# Patient Record
Sex: Male | Born: 1950 | Race: White | Hispanic: No | Marital: Married | Smoking: Former smoker
Health system: Southern US, Community
[De-identification: ages and names within clinical notes are randomized; demographics above are authoritative.]

## PROBLEM LIST (undated history)

## (undated) DIAGNOSIS — K921 Melena: Secondary | ICD-10-CM

## (undated) DIAGNOSIS — D509 Iron deficiency anemia, unspecified: Secondary | ICD-10-CM

## (undated) DIAGNOSIS — I10 Essential (primary) hypertension: Secondary | ICD-10-CM

## (undated) DIAGNOSIS — M1990 Unspecified osteoarthritis, unspecified site: Secondary | ICD-10-CM

## (undated) DIAGNOSIS — K219 Gastro-esophageal reflux disease without esophagitis: Secondary | ICD-10-CM

## (undated) DIAGNOSIS — G4730 Sleep apnea, unspecified: Secondary | ICD-10-CM

## (undated) DIAGNOSIS — K859 Acute pancreatitis without necrosis or infection, unspecified: Secondary | ICD-10-CM

## (undated) DIAGNOSIS — I739 Peripheral vascular disease, unspecified: Secondary | ICD-10-CM

## (undated) DIAGNOSIS — R011 Cardiac murmur, unspecified: Secondary | ICD-10-CM

## (undated) DIAGNOSIS — I2510 Atherosclerotic heart disease of native coronary artery without angina pectoris: Secondary | ICD-10-CM

## (undated) DIAGNOSIS — E119 Type 2 diabetes mellitus without complications: Secondary | ICD-10-CM

## (undated) DIAGNOSIS — Z9289 Personal history of other medical treatment: Secondary | ICD-10-CM

## (undated) DIAGNOSIS — E785 Hyperlipidemia, unspecified: Secondary | ICD-10-CM

## (undated) HISTORY — DX: Unspecified osteoarthritis, unspecified site: M19.90

## (undated) HISTORY — PX: JOINT REPLACEMENT: SHX530

## (undated) HISTORY — DX: Acute pancreatitis without necrosis or infection, unspecified: K85.90

## (undated) HISTORY — DX: Atherosclerotic heart disease of native coronary artery without angina pectoris: I25.10

## (undated) HISTORY — DX: Hyperlipidemia, unspecified: E78.5

## (undated) HISTORY — DX: Iron deficiency anemia, unspecified: D50.9

## (undated) HISTORY — PX: TOTAL KNEE ARTHROPLASTY: SHX125

## (undated) HISTORY — DX: Melena: K92.1

---

## 1996-12-25 HISTORY — PX: ERCP: SHX60

## 1999-08-20 HISTORY — PX: CARDIAC CATHETERIZATION: SHX172

## 1999-09-09 ENCOUNTER — Ambulatory Visit (HOSPITAL_COMMUNITY): Admission: RE | Admit: 1999-09-09 | Discharge: 1999-09-09 | Payer: Self-pay | Admitting: Cardiovascular Disease

## 1999-09-09 ENCOUNTER — Encounter: Payer: Self-pay | Admitting: Cardiovascular Disease

## 2006-02-14 ENCOUNTER — Ambulatory Visit (HOSPITAL_COMMUNITY): Admission: RE | Admit: 2006-02-14 | Discharge: 2006-02-14 | Payer: Self-pay | Admitting: General Surgery

## 2006-02-14 HISTORY — PX: COLONOSCOPY: SHX174

## 2006-05-04 ENCOUNTER — Inpatient Hospital Stay (HOSPITAL_COMMUNITY): Admission: RE | Admit: 2006-05-04 | Discharge: 2006-05-09 | Payer: Self-pay | Admitting: Orthopedic Surgery

## 2007-04-19 ENCOUNTER — Inpatient Hospital Stay (HOSPITAL_COMMUNITY): Admission: RE | Admit: 2007-04-19 | Discharge: 2007-04-22 | Payer: Self-pay | Admitting: Orthopedic Surgery

## 2007-06-26 ENCOUNTER — Ambulatory Visit (HOSPITAL_COMMUNITY): Admission: RE | Admit: 2007-06-26 | Discharge: 2007-06-26 | Payer: Self-pay | Admitting: Orthopedic Surgery

## 2008-11-08 HISTORY — PX: UPPER GASTROINTESTINAL ENDOSCOPY: SHX188

## 2008-11-25 HISTORY — PX: OTHER SURGICAL HISTORY: SHX169

## 2009-11-19 DIAGNOSIS — D509 Iron deficiency anemia, unspecified: Secondary | ICD-10-CM

## 2009-11-19 DIAGNOSIS — K921 Melena: Secondary | ICD-10-CM

## 2009-11-19 DIAGNOSIS — IMO0002 Reserved for concepts with insufficient information to code with codable children: Secondary | ICD-10-CM

## 2009-11-19 DIAGNOSIS — K859 Acute pancreatitis without necrosis or infection, unspecified: Secondary | ICD-10-CM

## 2009-11-19 HISTORY — DX: Iron deficiency anemia, unspecified: D50.9

## 2009-11-19 HISTORY — DX: Acute pancreatitis without necrosis or infection, unspecified: K85.90

## 2009-11-19 HISTORY — DX: Reserved for concepts with insufficient information to code with codable children: IMO0002

## 2009-11-19 HISTORY — DX: Melena: K92.1

## 2010-08-24 LAB — CBC AND DIFFERENTIAL
HCT: 38 % — AB (ref 41–53)
Platelets: 316 10*3/uL (ref 150–399)

## 2010-10-02 DIAGNOSIS — I2510 Atherosclerotic heart disease of native coronary artery without angina pectoris: Secondary | ICD-10-CM

## 2010-10-02 HISTORY — DX: Atherosclerotic heart disease of native coronary artery without angina pectoris: I25.10

## 2010-11-03 NOTE — Op Note (Signed)
NAMEALFONZO, ARCA NO.:  [id number]   MEDICAL RECORD NO.:  [id number]          PATIENT TYPE:  INP   LOCATION:  X010                         FACILITY:  [facility]   PHYSICIAN:  Ollen Gross, M.D.    DATE OF BIRTH:  03/06/1951   DATE OF PROCEDURE:  04/19/2007  DATE OF DISCHARGE:                               OPERATIVE REPORT   PREOPERATIVE DIAGNOSIS:  Osteoarthritis, right knee.   POSTOPERATIVE DIAGNOSIS:  Osteoarthritis, right knee.   PROCEDURE:  Right total knee arthroplasty.   SURGEON:  Ollen Gross, M.D.   ASSISTANT:  Alexzandrew L. Perkins, P.A.-C.   ANESTHESIA:  Spinal.   BLOOD LOSS:  Minimal.   DRAINS:  None.   TOURNIQUET TIME:  35 minutes at 300 mmHg.   COMPLICATIONS:  None.   CONDITION:  Stable to recovery.   BRIEF CLINICAL NOTE:  Luke [name] is a 60 year old male with severe end-stage  arthritis of the right knee with progressively worsening pain and  dysfunction.  He has had a very successful left total knee arthroplasty  and presents now for right total knee arthroplasty.   PROCEDURE IN DETAIL:  The the successful initiation of spinal  anesthetic, a tourniquet is placed on the right thigh and right lower  extremity prepped and draped in the usual sterile fashion.  The  extremity is wrapped in an Esmarch, the knee flexed, tourniquet inflated  to 300 mmHg.  A midline incision made with a 10 blade through the  subcutaneous tissue to the level of the extensor mechanism.  A fresh  blade is used to make a medial parapatellar arthrotomy.  Soft tissue  over the proximal medial tibia is subperiosteally elevated to the joint  line with a knife and into the semimembranosus bursa with a Cobb  elevator.  Soft tissue laterally is elevated with attention being paid  to avoiding the patellar tendon on the tibial tubercle.  The patella is  subluxed laterally, the knee flexed 90 degrees, and ACL and PCL are  removed.  A drill is used to create a starting hole  in the distal femur  and the canal is thoroughly irrigated.  The 5-degree right valgus  alignment guide is placed and referencing off the posterior condyles,  rotation is marked and the block pinned to remove 10 mm off the distal  femur.  Distal femoral resection is made with an oscillating saw.  A  sizing block is placed, a size 3 is the most appropriate.  Rotation is  marked off the epicondylar axis.  A size 3 cutting block is placed and  the anterior-posterior and chamfer cuts made.   Tibia is subluxed forward and the menisci are removed.  The  extramedullary tibial alignment guide is placed referencing proximally  at the medial aspect of the tibial tubercle and distally along the  second metatarsal axis and tibial crest.  The block is pinned to remove  about 10 mm off the nondeficient lateral side.  We have to go down an  additional 2 mm to get to the base of the defect medially.  The tibial  resection is made with an oscillating saw.  A size 3 is also the most  appropriate tibial component and the proximal tibia is prepared with the  modular drill and keel punch for the size 3.  Femoral preparation is  completed with the intercondylar cut.   A size 3 mobile bearing tibial trial, size 3posterior stabilized femoral  trial and a 12.5-mm posterior stabilized rotating platform insert trial  are placed.  With the 12.5, full extension is achieved with excellent  varus and valgus balance throughout a full range of motion.  The patella  is then everted and the thickness measured to be 22 mL.  Freehand  resection is taken to about 12 mm.  The 38 template is placed, lug holes  are drilled, trial patella is placed and it tracks normally.  Osteophytes are removed off the posterior femur with the trial in place.  All trials are removed and the cut bone surfaces are prepared with  pulsatile lavage.  Cement is mixed and once ready for implantation, a  size 3 mobile bearing tibial tray, size 3  posterior stabilized femur and  38 patella are cemented into place.  The patella is held the clamp.  A  trial 12.5 insert is placed and the knee held in full extension, all  extruded cement removed.  When the cement is fully hardened, then the  permanent 12.5 mm posterior stabilized rotating platform insert is  placed into the tibial tray.  The wound is copiously irrigated with  saline solution.  The FloSeal is injected on the posterior capsule,  medial and lateral gutters and suprapatellar area.  The tourniquet is  then released with a total time of 35 minutes.  Minor bleeding is  stopped with cautery.  The joint is again irrigated and then the  extensor mechanism closed with interrupted #1 PDS.  Flexion against  gravity is 135 degrees.  The subcu is closed with interrupted 2-0  Vicryl, subcuticular running 4-0 Monocryl.  The incision is cleaned and  dried and Steri-Strips and a bulky sterile dressing applied.  He is  placed into a knee immobilizer, awakened and transported to recovery in  stable condition.      Ollen Gross, M.D.  Electronically Signed     FA/MEDQ  D:  04/19/2007  T:  04/20/2007  Job:  [id number]

## 2010-11-03 NOTE — Op Note (Signed)
[name], [name] NO.:  [id number]   MEDICAL RECORD NO.:  [id number]          PATIENT TYPE:  AMB   LOCATION:  DAY                          FACILITY:  [facility]   PHYSICIAN:  Ollen Gross, M.D.    DATE OF BIRTH:  [birth year]   DATE OF PROCEDURE:  06/26/2007  DATE OF DISCHARGE:                               OPERATIVE REPORT   PREOPERATIVE DIAGNOSIS:  Arthrofibrosis, right knee.   POSTOPERATIVE DIAGNOSIS:  Arthrofibrosis, right knee.   PROCEDURE:  Right knee closed manipulation.   SURGEON:  Ollen Gross, M.D.  No assistant.   ANESTHESIA:  General.   Pre-manipulation range of motion 15-90, post-manipulation range of  motion 0-125.   COMPLICATIONS:  None.   CONDITION:  Stable to recovery.   BRIEF CLINICAL NOTE:  Luke [name] is a 60 year old male who had a right total  knee arthroplasty done on April 19, 2007.  He has had difficulty  regaining his range of motion despite effort with physical therapy.  He  is stuck at 15-90 degrees and presents for closed manipulation.   PROCEDURE IN DETAIL:  After the successful initiation of general  anesthetic, I did an exam under anesthesia.  Range was 15-90.  I placed  my chest on his proximal tibia and flexed the knee with audible lysis of  adhesions.  I was able to get him to flex to 125 degrees.  I was then  able to compress the hip in extension to gain full extension.  His  patellar mobility improved dramatically.  He is subsequently awakened  and transported to recovery in stable condition.      Ollen Gross, M.D.  Electronically Signed     FA/MEDQ  D:  06/26/2007  T:  06/27/2007  Job:  [id number]

## 2010-11-03 NOTE — H&P (Signed)
NAMESTILLMAN, BUENGER NO.:  [id number]   MEDICAL RECORD NO.:  [id number]          PATIENT TYPE:  INP   LOCATION:  X010                         FACILITY:  [facility]   PHYSICIAN:  Ollen Gross, M.D.    DATE OF BIRTH:  1950/12/04   DATE OF ADMISSION:  04/19/2007  DATE OF DISCHARGE:                              HISTORY & PHYSICAL   CHIEF COMPLAINT:  Left knee pain.   HISTORY OF PRESENT ILLNESS:  The patient is a 60 year old male who has  seen by Dr. Lequita Halt for ongoing bilateral knee pain.  The left knee is  more symptomatic and problematic than left knee.  He has been followed  for quite some time now. He has known arthritis.  He has previously  undergone a left total knee and is doing quite well. The right knee  continues to have pain, and it is felt he has reached the point where he  would benefit from undergoing surgical intervention.  Risks and benefits  have been discussed.  He elects to proceed with surgery.   ALLERGIES:  MORPHINE causes itching.   INTOLERANCES:  PERCOCET causes nausea, vomiting.   CURRENT MEDICATIONS:  Ramipril, Janumet, diclofenac,  bisoprolol/hydrochlorothiazide, Caduet, and aspirin and also Niaspan.   PAST MEDICAL HISTORY:  1. Diabetes.  2. Hypertension.  3. Hyperlipidemia.  4. Mild nonobstructive coronary arterial disease.  5. He has a past history of sleep apnea, but this resolved with weight      loss.   PAST SURGICAL HISTORY:  Left total knee replacement November 2007.   SOCIAL HISTORY:  Married.  No history of alcohol or tobacco. Two  children.  Wife will be assisting with care after surgery.   FAMILY HISTORY:  Negative.   REVIEW OF SYSTEMS:  GENERAL:  No fevers, chills or night sweats.  NEUROLOGIC:  No seizures, syncope, or paralysis.  RESPIRATORY:  No  shortness of breath, productive cough or hemoptysis.  CARDIOVASCULAR:  No chest pain, angina, orthopnea.  GI:  No nausea, vomiting,diarrhea,  or constipation.  GU:  No  dysuria, hematuria,  or discharge.  MUSCULOSKELETAL:  Right knee.   PHYSICAL EXAMINATION:  VITAL SIGNS:  Pulse 76, respirations 14, blood  pressure 148/76.  GENERAL:  The patient is a 60 year old white male, well-nourished, well-  developed, no acute distress.  Alert, oriented, cooperative, good  historian.  HEENT:  Normocephalic, atraumatic.  Pupils round and reactive.  Oropharynx clear.  Does have full upper dentures.  EOMs intact.  NECK:  Supple.  CHEST:  Clear anterior posterior chest walls.  No rhonchi, rales or  wheezing.  HEART:  Regular rhythm.  No murmur, S1-S2 noted.  ABDOMEN:  Soft, nontender.  Bowel sounds present.  RECTA, BREASTS, GENITALIA:  Not done, not pertinent to present illness.  EXTREMITIES:  Right knee range of motion of 0-115.  Marked crepitus, no  instability.   IMPRESSION:  1. Osteoarthritis, right knee.  2. Diabetes mellitus.  3. Hypertension.  4. Hyperlipidemia.  5. Mild nonobstructive coronary arterial disease.  6. Past history of sleep apnea (resolved with weight loss).   PLAN:  The patient will be admitted to [facility] to undergo a  right total knee replacement arthroplasty.  Surgery will be performed by  Dr. Ollen Gross.      Alexzandrew L. Perkins, P.A.C.      Ollen Gross, M.D.  Electronically Signed    ALP/MEDQ  D:  04/19/2007  T:  04/19/2007  Job:  [id number]   cc:   Nicki Guadalajara, M.D.  Fax: [phone]   Kirk Ruths, M.D.  Fax: [phone]

## 2010-11-06 NOTE — Discharge Summary (Signed)
[name], [name] NO.:  [id number]   MEDICAL RECORD NO.:  [id number]          PATIENT TYPE:  INP   LOCATION:  1515                         FACILITY:  [facility]   PHYSICIAN:  Luke [name], M.D.    DATE OF BIRTH:  1950/09/01   DATE OF ADMISSION:  05/04/2006  DATE OF DISCHARGE:  05/09/2006                               DISCHARGE SUMMARY   ADMISSION DIAGNOSES:  1. Bilateral knees osteoarthritis.  2. History of sleep apnea, resolved after weight loss.  3. Hypercholesteremia.  4. Hypertension.  5. Mild coronary artery disease without obstruction.  6. Non-insulin-dependant diabetes mellitus.   DISCHARGE DIAGNOSES:  1. Osteoarthritis left knee, status post left total knee arthroplasty.  2. Mild postoperative acute blood loss anemia, did not require      transfusion.  3. Hyponatremia.  4. History of sleep apnea, resolved after weight loss.  5. Hypercholesteremia.  6. Hypertension.  7. Mild coronary artery disease without obstruction.  8. Non-insulin-dependant diabetes mellitus.   PROCEDURES:  Left total knee.   SURGEON:  Dr. Lequita [name], assisted by Luke Duel, PA-C.   ANESTHESIA:  Spinal.  Tourniquet time 41 minutes.   CONSULTATIONS:  None.   BRIEF HISTORY:  Luke [name] is a 60 year old male with end-stage arthritis of  left knee with intractable pain, extensive nonoperative management.  At  this time, it is now longer helping and now presents for total knee  arthroplasty.   LABORATORY DATA:  Preoperative CBC showed hemoglobin 15.4, hematocrit  44.6, white count 9.5.  Postoperative hemoglobin down to 11.4.  Last H&H  10.8 and 31.7.  PT, PTT on admission 13.1 and 33 respectively.  INR 1.0.  Serial pro times followed.  Last noted PT/INR 29.1 and 2.6.  Chem panel  on admission all looked within normal limits with the exception of  elevated glucose of 185.  Serial BMETs are followed.  Sodiums are 138  and 134, last noted at 130.  The remainder of electrolytes  remained  within normal limits although chloride did drop from 102 to 95, last  noted 94.  Glucose came down from 185 to 136.  Urinalysis preop  negative.  Blood type A positive.   X-RAYS:  1. Chest x-ray, 04/26/2006, no evidence of acute chest disease.  2. EKG, 04/12/2006:  Sinus rhythm, confirmed.  3. Cardiolite, 04/12/2006:  No significant ischemia demonstrated      compared to previous setting.  There is no significant change.      This is potentially a normal perfusion myocardial scan.   HOSPITAL COURSE:  The patient admitted to [facility],  tolerated the procedure well.  He was later transferred to the recovery  room on orthopedic floor, started on PCMP with analgesics for pain  control.  Given 24 hours of postoperative IV antibiotics.  Started on  Coumadin for DVT prophylaxis.  He had a rough night with pain after  surgery.  He did have some itching.  PCA was switched over.  He felt a  little flushed.  On the morning of day 1, Hemovac drain had already come  out.  He started with physical therapy.  Reduced the fluid rate.  By day  2, the patient is doing better, a little sore.  Passing flatus but no  bowel movement yet.  Removed the Foley.  Dressing was changed.  Incision  looked excellent.  Very minimal swelling postoperatively.   The patient is doing well enough that he wanted to go home over the  weekend, so we started setting up plans for him to go home possibly the  next day.  However, on day 3, he was seen, he was progressing with  therapy and ambulating well.  However, but he had some erythema  developing around the wound.  Covering services started him back on his  Ancef.  The IV Ancef was resumed and continued for about 48 hours.  He  continued to receive therapy, but wanted to continue to watch the area  around the wound, which is more of a cellulitic change.  That did  improve a little bit by day 4 and by day 5 he was doing better.  The  erythema had  improved somewhat on the IV Ancef.  He had progressed well  and had been weaned over to p.o. medications and it was decided he was  to be discharged home at that time.   DISCHARGE PLAN:  1. The patient discharged home on 05/09/2006.  2. Discharge diagnosis:  Please see above.  3. Discharge medications:  Percocet, Robaxin, Keflex, and Coumadin.  4. Diet: Resume home diet.  5. Activity:  Weightbearing as tolerated.  Home PT and nursing total      knee protocol.  Follow up in 2 weeks.   DISPOSITION:  Home.   CONDITION ON DISCHARGE:  Improving.      Luke [name], P.A.      Luke [name], M.D.  Electronically Signed    ALP/MEDQ  D:  06/02/2006  T:  06/02/2006  Job:  [id number]   cc:   Luke [name], M.D.  Fax: [phone]   Luke [name], M.D.  Fax: [phone]   Luke [name], M.D.  Fax: [phone]

## 2010-11-06 NOTE — H&P (Signed)
NAMEJAHMAI, Luke [name] NO.:  [id number]   MEDICAL RECORD NO.:  [id number]          PATIENT TYPE:  INP   LOCATION:  0001                         FACILITY:  [facility]   PHYSICIAN:  Ollen Gross, M.D.    DATE OF BIRTH:  1950/08/08   DATE OF ADMISSION:  05/04/2006  DATE OF DISCHARGE:                                HISTORY & PHYSICAL   DATE OF OFFICE VISIT/HISTORY AND PHYSICAL:  April 21, 2006.   DATE OF ADMISSION:  May 04, 2006.   CHIEF COMPLAINT:  Bilateral knee pain, left greater than right.   HISTORY OF PRESENT ILLNESS:  The patient is a 60 year old male who has been  seen by Dr. Lequita Halt for ongoing knee problems. He has had bilateral knee  pain.  The left is more symptomatic and problematic than the right.  He has  had cortisone injections in the past but despite conservative measures and  injections, he continues to have pain and it is felt that he has reached a  point where he would benefit from undergoing surgery.  The risks and  benefits were discussed with the patient and he is subsequently admitted to  the hospital.   ALLERGIES:  No known drug allergies.   CURRENT MEDICATIONS:  1. Niaspan.  2. Ziac.  3. Norvasc.  4. Altace.  5. Glucophage.  6. Voltaren.  7. Caduet.  8. Baby aspirin.   PAST MEDICAL HISTORY:  1. History of sleep apnea but did resolve after he lost weight.  2. Hypercholesterolemia.  3. Mild coronary arterial disease without obstruction.  4. Hypertension.  5. Non-insulin-dependent diabetes mellitus.   PAST SURGICAL HISTORY:  Right knee arthroscopy.   SOCIAL HISTORY:  Married, works in Air[employer], nonsmoker.  No alcohol.  Two  children.   FAMILY HISTORY:  Mother deceased age 72 secondary to motor vehicle accident.  Family history also significant for heart disease and diabetes.   REVIEW OF SYSTEMS:  GENERAL:  No fevers, chills, night sweats.  NEUROLOGICAL:  No seizures, syncope or paralysis. RESPIRATORY:  No  productive  cough, hemoptysis or shortness of breath.  CARDIOVASCULAR:  No  chest pain, angina or orthopnea. GASTROINTESTINAL:  No nausea, vomiting,  diarrhea, constipation.  GENITOURINARY:  No dysuria, hematuria, discharge.  MUSCULOSKELETAL:  Bilateral knees.   PHYSICAL EXAMINATION:  VITAL SIGNS:  Pulse 56, respirations 12, blood  pressure 132/68.  GENERAL APPEARANCE:  A 60 year old white male, well-nourished, well-  developed in no acute distress.  Alert, oriented, cooperative, very  pleasant, accompanied by his wife.  HEENT:  Normocephalic, atraumatic.  Pupils equal, round, reactive.  Extraocular movements intact. Tympanic membranes intact.  NECK:  Supple, no bruits.  CHEST:  Clear anterior and posterior chest walls.  HEART:  Regular rate and rhythm, no murmur.  S1 and S2 noted.  ABDOMEN:  Soft, nontender.  Bowel sounds present.  RECTAL, BREASTS, GENITOURINARY:  Not done, not pertinent to present illness.  EXTREMITIES:  Right knee shows range of motion 5 to 120 degrees, marked  crepitus, marked varus.  Left knee shows range of motion of 5 to 115  degrees,  marked varus and crepitus noted.   IMPRESSION:  1. Bilateral knees osteoarthritis.  2. History of sleep apnea, resolved after weight loss.  3. Hypercholesterolemia.  4. Hypertension.  5. Mild coronary arterial disease without obstruction.  6. Non-insulin-dependent diabetes mellitus.   PLAN:  Patient will be admitted to [facility] to undergo a left  total knee arthroplasty.  Surgery will be performed by Dr. Ollen Gross.  His cardiologist is Dr. Tresa Endo, his primary care physician is Dr. Regino Schultze.  Both will be notified of the patient's room number on admission and will be  consulted if needed for any medical assistance with the patient throughout  the hospital course.      Alexzandrew L. Julien Girt, P.A.      Ollen Gross, M.D.  Electronically Signed    ALP/MEDQ  D:  05/03/2006  T:  05/04/2006  Job:  [id number]   cc:    Kirk Ruths, M.D.  Fax: [phone]   Nicki Guadalajara, M.D.  Fax: [phone]

## 2010-11-06 NOTE — H&P (Signed)
Luke [name], Luke [name] NO.:  [id number]   MEDICAL RECORD NO.:  [id number]            PATIENT TYPE:   LOCATION:                                 FACILITY:   PHYSICIAN:  Dalia Heading, M.D.  DATE OF BIRTH:  1951/05/24   DATE OF ADMISSION:  DATE OF DISCHARGE:  LH                                HISTORY & PHYSICAL   CHIEF COMPLAINT:  Need for screening colonoscopy.   HISTORY OF PRESENT ILLNESS:  Patient is a 60 year old white male who is  referred for endoscopic evaluation.  He needs a colonoscopy for screening  purposes.  No abdominal pain, weight loss, nausea, vomiting, diarrhea,  constipation, melena, or hematochezia have been noted.  He has never had a  colonoscopy.  There is no family history of colon carcinoma.   PAST MEDICAL HISTORY:  Non-insulin-dependent diabetes mellitus,  hypertension.   PAST SURGICAL HISTORY:  Unremarkable.   CURRENT MEDICATIONS:  Bisoprolol/hydrochlorothiazide, Januvia, Caduet,  Altace, diclofenac, Niaspan, low dose aspirin.   ALLERGIES:  No known drug allergies.   REVIEW OF SYSTEMS:  Noncontributory.   PHYSICAL EXAMINATION:  GENERAL:  Patient is a well-developed and well-  nourished white male in no acute distress.  LUNGS:  Clear to auscultation with equal breath sounds bilaterally.  HEART:  Regular rate and rhythm without S3, S4, or murmurs.  ABDOMEN:  Soft, nontender, nondistended.  No hepatosplenomegaly or masses  are noted.  RECTAL:  Deferred to the procedure.   IMPRESSION:  Need for screening colonoscopy.   PLAN:  Patient is scheduled for a colonoscopy on February 14, 2006.  The risks  and benefits of the procedure, including bleeding and perforation, were  fully explained to the patient, who gave informed consent.      Dalia Heading, M.D.  Electronically Signed     MAJ/MEDQ  D:  01/13/2006  T:  01/13/2006  Job:  [id number]   cc:   [facility]  Fax: [phone]   Kirk Ruths, M.D.  Fax: [phone]

## 2010-11-06 NOTE — Cardiovascular Report (Signed)
[place]  Patient:    Luke [name], Luke [name]                          MRN: [id number] Adm. Date:  [id number] Attending:  Virgina Evener CC:         Armanda Magic, M.D.             Maurice Small., M.D.             [facility], Office                        Cardiac Catheterization  INDICATIONS:  Mr. [name] is a 60 year old white male patient of Dr. Regino Schultze and Dr. Mayford Knife.  He has a history of exertionally precipitated back and chest discomfort without radiation to his arms or neck.  A Cardiolite scan suggested ild lateral ischemia.  He is now referred for definitive cardiac catheterization. Previously, he had smoked two packs per day for 30 years but quit smoking approximately one year ago.  HEMODYNAMIC DATA:  Central aortic pressure is 174/92.  Left ventricular pressure was 174/22 post A wave 31.  ANGIOGRAPHIC DATA: 1. The left main coronary artery was almost like a double-barrel left main and    essentially gave rise to an LAD and left circumflex system. 2. The LAD gave rise to a very proximal small diagonal vessel that had 60% proximal    and 70-80% mid stenosis in this vessel that was probably less than 1.5 mm. he    second diagonal vessel is moderate sized and normal.  The LAD had 50% narrowing    beyond the second diagonal. 3. The circumflex vessel essentially arose in like a common barrel system and    immediately gave rise to a ramus intermedius-type vessel.  There appeared to be    very focal 50% ostial circumflex stenosis just after the ramus takeoff with 0%    narrowing at the origin of the ramus followed by 40% diffuse proximal ramus    narrowing. 4. The right coronary artery had mild 10-20% irregularity in its mid segment.  Biplane left cine ventriculography revealed normal LV function without focal segmental wall motion abnormalities.  Because of the patients hypertensive history, distal aortography was done.   There was no renal artery stenosis. There was minimal 20% smooth narrowing in the left common iliac artery.  IMPRESSION: 1. Normal LV function. 2. Mild coronary obstructive disease with 60% and 70-80% stenoses in a very small    first diagonal vessel of the LAD but 50% mid LAD stenosis, a 50% ostial left    circumflex stenosis with 40% ostial and proximal intermedius stenosis, a 10-20%    irregularity in the mid right coronary artery.  RECOMMENDATION:  Medical therapy with initiation of lipid-lowering therapy for plaque stability and potential CAD regression. DD:  09/09/99 TD:  09/09/99 Job: [id number]

## 2010-11-06 NOTE — Op Note (Signed)
NAMENECO, KLING NO.:  [id number]   MEDICAL RECORD NO.:  [id number]          PATIENT TYPE:  INP   LOCATION:  0001                         FACILITY:  [facility]   PHYSICIAN:  Ollen Gross, M.D.    DATE OF BIRTH:  25-Mar-1951   DATE OF PROCEDURE:  05/04/2006  DATE OF DISCHARGE:                                 OPERATIVE REPORT   POSTOPERATIVE DIAGNOSIS:  Osteoarthritis of left knee.   POSTOPERATIVE DIAGNOSIS:  Osteoarthritis of left knee.   PROCEDURE:  Left total knee arthroplasty.   SURGEON:  Ollen Gross, M.D.   ASSISTANT:  Avel Peace, P.A.   ANESTHESIA:  Spinal.   ESTIMATED BLOOD LOSS:  Minimal.   DRAIN:  Hemovac x1.   TOURNIQUET TIME:  Forty-one minutes at 300 mmHg.   COMPLICATIONS:  None.   CONDITION:  Stable, to Recovery.   BRIEF CLINICAL NOTE:  Luke [name] is a 60 year old male with end-stage  osteoarthritis of left knee and intractable pain.  He has had extensive  nonoperative management and at this time it is no longer helping.  He  presents now for left total knee arthroplasty.   PROCEDURE IN DETAIL:  After the successful administration of spinal  anesthetic, a tourniquet was placed high on the left thigh and left lower  extremity prepped and draped in the usual sterile fashion.  Extremity was  wrapped with a Esmarch, knee flexed, and tourniquet inflated to 300 mmHg.  A  midline incision was made with a 10 blade through subcutaneous tissue to the  level of the extensor mechanism.  A fresh blade was used to make a medial  parapatellar arthrotomy.  Soft tissue over the proximal medial tibia was  subperiosteally elevated off the joint line with a knife and into the  semimembranosus bursa with a Cobb elevator.  Soft tissue laterally was  elevated with attention being paid to avoiding the patellar tendon on the  tibial tubercle.  The patella was subluxed laterally and knee flexed to 90  degrees, ACL and PCL removed.  A drill was used to create a  starting hole in  the distal femur and canal was thoroughly irrigated.  A 5-degree left valgus  alignment guide was placed and referencing off the posterior condyles,  rotation was marked and a block pinned to remove 10 mm off the distal femur.  Distal femoral resection was made with an oscillating saw.  Sizing block was  placed and a size 3 was most appropriate.  Rotations was marked off the  epicondylar axis.  A size 3 cutting blocks was placed and the anterior,  posterior and chamfer cuts made.   Tibia was subluxed forward and the menisci were removed.  Extramedullary  tibial alignment guide was placed, referencing proximally at the medial  aspect of the tibial tubercle and distally along the second metatarsal axis  and tibial crest.  Block was pinned to remove 10 mm off the non-deficient  lateral side.  Tibial resection was made with an oscillating saw.  A size 3  was the most appropriate tibial component and the proximal tibia was  prepared with the modular drill and keel punch for a size 3.  Femoral  preparation was completed with the intercondylar cut.   A size 3 mobile bearing tibial trial, size 3 posterior-stabilized femoral  trial and a 10-mm posterior-stabilized rotating platform insert trial were  placed.  With the 10, full extension was achieved with excellent varus and  valgus balance throughout full range of motion.  The patella was then  everted with the thickness measured to be 24 mm.  Freehand resection was  taken to 13 mm, 41 template was placed, lug holes were drilled, trial  patella was placed and it tracked normally.  Osteophytes were removed off  the posterior femur with the trial in place.  All trials were removed and  the cut bone surfaces prepared with pulsatile lavage.  Cement was mixed and  once ready for implantation, a size 3 mobile bearing tibial tray, size 3  posterior-stabilized femur and 41 patella were cemented into place and the  patella was held with  a clamp.  A trial 10-mm insert was placed and knee  held in full extension and all extruded cement removed.  Once the cement had  fully hardened, then the permanent 10-mm posterior-stabilized rotating  platform insert was placed into the tibial tray.  The wound was copiously  irrigated with saline solution and the extensor mechanism closed over a  Hemovac drain with interrupted #1 PDS.  Flexion against gravity was 135  degrees.  Tourniquet was released with a total time of 41 minutes.  Subcu  was closed with interrupted 2-0 Vicryl and subcuticular with running 4-0  Monocryl.  Drain was hooked to suction, incision cleaned and dried and Steri-  Strips and a bulky sterile dressing applied.  He was subsequently awakened  and transported to Recovery in stable condition.      Ollen Gross, M.D.  Electronically Signed     FA/MEDQ  D:  05/04/2006  T:  05/04/2006  Job:  [id number]

## 2010-11-06 NOTE — Discharge Summary (Signed)
[name], [name]:  [id number]   MEDICAL RECORD NO.:  [id number]          PATIENT TYPE:  INP   LOCATION:  1618                         FACILITY:  [facility]   PHYSICIAN:  Ollen Gross, M.D.    DATE OF BIRTH:  01-May-1951   DATE OF ADMISSION:  04/19/2007  DATE OF DISCHARGE:  04/22/2007                               DISCHARGE SUMMARY   ADMISSION DIAGNOSES:  1. Osteoarthritis, right knee.  2. Diabetes mellitus.  3. Hypertension.  4. Hyperlipidemia.  5. Mild nonobstructive coronary arterial disease.  6. Past history of sleep apnea (resolved with weight loss).   DISCHARGE DIAGNOSES:  1. Osteoarthritis, right knee, status post right total knee      replacement arthroplasty.  2. Diabetes mellitus.  3. Hypertension.  4. Hyperlipidemia.  5. Mild nonobstructive coronary arterial disease.  6. Past history of sleep apnea (resolved with weight loss).   PROCEDURE:  April 19, 2007, right total knee.   CONSULTATIONS:  None.   BRIEF HISTORY:  Luke [name] is a 60 year old male with severe end-stage  arthritis of the right knee and progressively worsening pain and  dysfunction, successful left total knee, and now presents for right  total knee.   LABORATORY DATA:  Preop CBC:  Hemoglobin 13.7, hematocrit 40.9, white  cell count 9.4.  Postop hemoglobin 11, drifted down to 10.3, came back  up, last noted H&H 10.5 and 31.0.  PT/PTT preop 12.7 and 31,  respectively, INR 0.9.  Serial pro times followed.  Last noted PT/INR  23.7/2.0.  Chem panel on admission:  Elevated glucose of 222.  Remaining  Chem panel within normal limits.  Serial BMETs were followed.  Electrolytes remained within normal limits.  Glucose came down to 149.  Preop UA:  Positive glucose, otherwise negative.  Blood group type A+.   EKG September 2008:  Sinus bradycardia, otherwise normal, unconfirmed.  Two-view chest April 13, 2007:  No acute cardiopulmonary process.   HOSPITAL COURSE:  The patient was  admitted to [facility],  tolerated the procedure well, and later transferred to the recovery room  and the orthopedic floor, started on PCA and p.o. analgesic, given 24  hours of postop IV antibiotics.  Had a pretty rough night with pain on  the evening of surgery but by morning of day #1 he was doing a little  bit better.  He had a little thigh pain which probably from the  tourniquet.  Started back on his diabetic medications.  His blood  pressure medications were resumed but placed on parameters to avoid any  kind of hypotension.  He started getting up out of bed by day #2.  He  was doing much better.  Pain was under better control.  He had already  gotten up and walked about 200 feet.  At dressing change, incision  looked good.  No complaints.  Progressed well and by day #3 he was ready  to go home.   DISCHARGE PLAN:  1. Home on April 22, 2007.  2. Discharge diagnoses:  Please see above.  3. Discharge medications:  Vicodin, Robaxin,  Coumadin.  4. 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  5. Follow-up:  Two weeks.   DISPOSITION:  Home.   CONDITION ON DISCHARGE:  Improved.      Luke [name], P.A.C.      Ollen Gross, M.D.  Electronically Signed    ALP/MEDQ  D:  06/08/2007  T:  06/08/2007  Job:  [id number]   cc:   Nicki Guadalajara, M.D.  Fax: [phone]   Kirk Ruths, M.D.  Fax: [phone]

## 2011-01-11 ENCOUNTER — Ambulatory Visit (INDEPENDENT_AMBULATORY_CARE_PROVIDER_SITE_OTHER): Payer: Self-pay | Admitting: Internal Medicine

## 2011-01-15 ENCOUNTER — Encounter (INDEPENDENT_AMBULATORY_CARE_PROVIDER_SITE_OTHER): Payer: Self-pay

## 2011-02-08 ENCOUNTER — Ambulatory Visit (INDEPENDENT_AMBULATORY_CARE_PROVIDER_SITE_OTHER): Payer: Self-pay | Admitting: Internal Medicine

## 2011-03-11 LAB — URINALYSIS, ROUTINE W REFLEX MICROSCOPIC
Bilirubin Urine: NEGATIVE
Hgb urine dipstick: NEGATIVE
Nitrite: NEGATIVE
Specific Gravity, Urine: 1.008
pH: 5.5

## 2011-03-11 LAB — COMPREHENSIVE METABOLIC PANEL
AST: 29
BUN: 10
CO2: 29
Chloride: 108
Creatinine, Ser: 0.93
GFR calc non Af Amer: >60
Total Bilirubin: 0.5

## 2011-03-11 LAB — CBC
HCT: 36.8 — ABNORMAL LOW
Hemoglobin: 12.2 — ABNORMAL LOW
MCV: 82.5
RBC: 4.46
WBC: 8.8

## 2011-03-11 LAB — APTT: aPTT: 28

## 2011-03-16 ENCOUNTER — Ambulatory Visit (INDEPENDENT_AMBULATORY_CARE_PROVIDER_SITE_OTHER): Payer: BC Managed Care – PPO | Admitting: Internal Medicine

## 2011-03-16 ENCOUNTER — Encounter (INDEPENDENT_AMBULATORY_CARE_PROVIDER_SITE_OTHER): Payer: Self-pay | Admitting: Internal Medicine

## 2011-03-16 VITALS — BP 128/80 | HR 72 | Temp 98.4°F | Resp 16 | Ht 65.0 in | Wt 181.9 lb

## 2011-03-16 DIAGNOSIS — D509 Iron deficiency anemia, unspecified: Secondary | ICD-10-CM

## 2011-03-16 DIAGNOSIS — R159 Full incontinence of feces: Secondary | ICD-10-CM

## 2011-03-16 DIAGNOSIS — K219 Gastro-esophageal reflux disease without esophagitis: Secondary | ICD-10-CM

## 2011-03-16 NOTE — Patient Instructions (Signed)
Blood work in dec,2012.

## 2011-03-16 NOTE — Progress Notes (Signed)
Presenting complaint; followup for iron deficiency anemia and rectal discharge. Subjective; patient is 60 year old Caucasian male who is here for his yearly visit. He was last seen in [place] office in June 2011. He states he is feeling well. His last hemoglobin was in January this year was greater than 13 g. He has a good appetite. He denies melena or rectal bleeding. He is still having to rectal discharge but this occurs no more than once every 2-3 months. As always small amounts. His heartburn is well controlled with PPI. It only occurs with certain foods. He denies dysphagia. He has gained 9 pounds since his last visit. His hemoglobin A1c has gone up to 7.8 and he is trying to lose weight. He sees Dr. Patrecia Pace regarding his diabetes mellitus Current medications Current Outpatient Prescriptions on File Prior to Visit  Medication Sig Dispense Refill  . amLODipine-atorvastatin (CADUET) 5-40 MG per tablet Take 1 tablet by mouth daily.        Marland Kitchen aspirin 81 MG tablet Take 81 mg by mouth daily.        . bisoprolol-hydrochlorothiazide (ZIAC) 2.5-6.25 MG per tablet Take 1 tablet by mouth daily.        . ferrous sulfate 325 (65 FE) MG tablet Take 325 mg by mouth daily with breakfast.        . metFORMIN (GLUMETZA) 1000 MG (MOD) 24 hr tablet Take 1,000 mg by mouth 2 (two) times daily.       . ramipril (ALTACE) 10 MG capsule Take by mouth daily.         ibuprofen 20 mg daily when necessary Levemir 14 units daily at bedtime Omeprazole 20 mg by mouth every morning  Onglyza 2.5 mg by mouth daily Altace  10 mg by mouth daily Objective BP 128/80  Pulse 72  Temp(Src) 98.4 F (36.9 C) (Oral)  Resp 16  Ht 5\' 5"  (1.651 m)  Wt 181 lb 14.4 oz (82.509 kg)  BMI 30.27 kg/m2 Conjunctiva and nail beds are pink. Oropharyngeal mucosa is normal No neck masses or thyromegaly noted Abdomen is soft and nontender without organomegaly or masses No peripheral edema or koilonychia noted . Assessment #1. History of iron  deficiency anemia she diagnosed with a half years ago with a complete workup including EGD colonoscopy and given capsule study. Last H&H was normal 9 months ago. If his iron stores are normal ferrous sulfate dose could be reduced. #2. History of fecal seepage possibly do to irritable bowel syndrome; now having this symptom occasionally. #3. Chronic GERD symptoms are well controlled with PPI Plan He'll continue omeprazole and ferrous sulfate as before. He'll have serum iron, TIBC and ferritin, in December along with his other other lab. We will contact him with results of blood work plan to see him back in one year.

## 2011-03-30 LAB — CBC
Hemoglobin: 10.5 — ABNORMAL LOW
MCHC: 33.9
MCV: 85.3
RBC: 3.64 — ABNORMAL LOW

## 2011-03-30 LAB — PROTIME-INR: INR: 2 — ABNORMAL HIGH

## 2011-03-31 LAB — CBC
HCT: 30.5 — ABNORMAL LOW
HCT: 32.7 — ABNORMAL LOW
Hemoglobin: 11 — ABNORMAL LOW
Hemoglobin: 13.7
MCHC: 33.5
MCHC: 33.5
MCHC: 33.9
MCV: 85.5
RBC: 3.57 — ABNORMAL LOW
RBC: 3.84 — ABNORMAL LOW
RBC: 4.8
RDW: 16 — ABNORMAL HIGH
WBC: 9.2
WBC: 9.4

## 2011-03-31 LAB — BASIC METABOLIC PANEL
BUN: 5 — ABNORMAL LOW
CO2: 29
CO2: 32
Chloride: 98
Creatinine, Ser: 0.76
GFR calc Af Amer: >60
GFR calc Af Amer: >60
Glucose, Bld: 183 — ABNORMAL HIGH
Potassium: 3.7
Potassium: 4.2
Sodium: 135

## 2011-03-31 LAB — COMPREHENSIVE METABOLIC PANEL
ALT: 35
Alkaline Phosphatase: 77
CO2: 31
Chloride: 102
GFR calc non Af Amer: >60
Glucose, Bld: 222 — ABNORMAL HIGH
Potassium: 4.3
Sodium: 139
Total Bilirubin: 0.6

## 2011-03-31 LAB — URINALYSIS, ROUTINE W REFLEX MICROSCOPIC
Bilirubin Urine: NEGATIVE
Nitrite: NEGATIVE
Specific Gravity, Urine: 1.024
pH: 5.5

## 2011-03-31 LAB — PROTIME-INR
Prothrombin Time: 12.7
Prothrombin Time: 18 — ABNORMAL HIGH

## 2011-05-03 ENCOUNTER — Telehealth (INDEPENDENT_AMBULATORY_CARE_PROVIDER_SITE_OTHER): Payer: Self-pay | Admitting: Internal Medicine

## 2011-05-03 NOTE — Telephone Encounter (Signed)
LM stating he would like for Tammy to give him a call. The return phone number is [phone].

## 2011-05-05 ENCOUNTER — Telehealth (INDEPENDENT_AMBULATORY_CARE_PROVIDER_SITE_OTHER): Payer: Self-pay | Admitting: *Deleted

## 2011-05-05 DIAGNOSIS — D509 Iron deficiency anemia, unspecified: Secondary | ICD-10-CM

## 2011-05-05 NOTE — Telephone Encounter (Signed)
Per Dr. Karilyn Cota , get the following labs on the patient. Iron, TIBC,Ferritin and CBC/D. We will call the patient with results and recommendations. Jacquis called and made aware, lab order faxed to [facility].

## 2011-05-05 NOTE — Telephone Encounter (Signed)
Luke [name] states that he has had a fecal leakage times 2 weeks which normally means that his Iron is low and that Dr. Karilyn Cota had told him that when this happens again to call so that Dr. Karilyn Cota could see him as soon as possible. I told Jandel that I would page Dr. Karilyn Cota and then give him a call back.

## 2011-05-05 NOTE — Telephone Encounter (Signed)
Patient states that  He has only seen blood when he wipes for the second time. He will get his lab work tomorrow.

## 2011-05-05 NOTE — Telephone Encounter (Signed)
Patient called office with c/o rectal leakage. Per Dr. Karilyn Cota get the following labs CBC/D, Ferritin, Iron/ Iron binding. Labs noted and faxed to [facility]. Patient was made aware.

## 2011-05-06 ENCOUNTER — Telehealth (INDEPENDENT_AMBULATORY_CARE_PROVIDER_SITE_OTHER): Payer: Self-pay | Admitting: *Deleted

## 2011-05-06 ENCOUNTER — Other Ambulatory Visit (INDEPENDENT_AMBULATORY_CARE_PROVIDER_SITE_OTHER): Payer: Self-pay | Admitting: Internal Medicine

## 2011-05-06 DIAGNOSIS — D649 Anemia, unspecified: Secondary | ICD-10-CM

## 2011-05-06 LAB — CBC WITH DIFFERENTIAL/PLATELET
Basophils Relative: 1 % (ref 0–1)
Eosinophils Absolute: 0.4 10*3/uL (ref 0.0–0.7)
Eosinophils Relative: 5 % (ref 0–5)
Hemoglobin: 13.7 g/dL (ref 13.0–17.0)
Lymphs Abs: 1.9 10*3/uL (ref 0.7–4.0)
MCH: 28.1 pg (ref 26.0–34.0)
MCHC: 32.4 g/dL (ref 30.0–36.0)
MCV: 86.9 fL (ref 78.0–100.0)
Monocytes Absolute: 0.7 10*3/uL (ref 0.1–1.0)
Monocytes Relative: 10 % (ref 3–12)
Neutrophils Relative %: 59 % (ref 43–77)
RBC: 4.87 MIL/uL (ref 4.22–5.81)

## 2011-05-06 NOTE — Telephone Encounter (Signed)
We needed to redo orders due to a test had been removed and it was unsure if the computer may still read it causing the patient to billed twice.

## 2011-05-07 LAB — FERRITIN: Ferritin: 19 ng/mL — ABNORMAL LOW (ref 22–322)

## 2011-05-07 LAB — IRON AND TIBC
%SAT: 15 % — ABNORMAL LOW (ref 20–55)
TIBC: 331 ug/dL (ref 215–435)

## 2011-05-10 ENCOUNTER — Ambulatory Visit (INDEPENDENT_AMBULATORY_CARE_PROVIDER_SITE_OTHER): Payer: BC Managed Care – PPO | Admitting: Internal Medicine

## 2011-05-10 NOTE — Progress Notes (Signed)
Cancelled apt for today, 05/10/11.  LM to return the call to schedule a 3 mth f/u.

## 2011-06-03 ENCOUNTER — Encounter (INDEPENDENT_AMBULATORY_CARE_PROVIDER_SITE_OTHER): Payer: Self-pay | Admitting: *Deleted

## 2011-08-09 ENCOUNTER — Ambulatory Visit (INDEPENDENT_AMBULATORY_CARE_PROVIDER_SITE_OTHER): Payer: BC Managed Care – PPO | Admitting: Internal Medicine

## 2011-08-31 ENCOUNTER — Ambulatory Visit (INDEPENDENT_AMBULATORY_CARE_PROVIDER_SITE_OTHER): Payer: BC Managed Care – PPO | Admitting: Internal Medicine

## 2011-08-31 ENCOUNTER — Encounter (INDEPENDENT_AMBULATORY_CARE_PROVIDER_SITE_OTHER): Payer: Self-pay | Admitting: Internal Medicine

## 2011-08-31 DIAGNOSIS — R197 Diarrhea, unspecified: Secondary | ICD-10-CM | POA: Insufficient documentation

## 2011-08-31 DIAGNOSIS — E785 Hyperlipidemia, unspecified: Secondary | ICD-10-CM | POA: Insufficient documentation

## 2011-08-31 DIAGNOSIS — D509 Iron deficiency anemia, unspecified: Secondary | ICD-10-CM

## 2011-08-31 DIAGNOSIS — E119 Type 2 diabetes mellitus without complications: Secondary | ICD-10-CM | POA: Insufficient documentation

## 2011-08-31 DIAGNOSIS — K219 Gastro-esophageal reflux disease without esophagitis: Secondary | ICD-10-CM

## 2011-08-31 DIAGNOSIS — I10 Essential (primary) hypertension: Secondary | ICD-10-CM | POA: Insufficient documentation

## 2011-08-31 NOTE — Patient Instructions (Signed)
Imodium OTC 1 mg by mouth every evening can increase the dose to 2 mg every evening if 1 mg does not control diarrhea. To have CBC, serum iron, TIBC, saturation and ferritin with next blood work  If the diarrhea persists talk with Dr. Patrecia Pace if metformin dose could be reduced.

## 2011-08-31 NOTE — Progress Notes (Signed)
Presenting complaint; Followup for iron deficiency anemia and diarrhea and GERD. Subjective:  Luke [name] is 61 year old Caucasian male who is in for scheduled visit. He was last seen on 03/16/2011. He has gained 5 pounds since his last visit. He states he walks 2 miles a day on most days. He is trying to lose weight and improve diabetic control. Last hemoglobin A1c was 8.4 and he is to have lab studies in 3 months when he sees Dr. Sharin Grave. His heartburn has been well controlled except he had postprandial throat burning 2 nights in a row. He took TUMS with immediate relief. He denies dysphagia or abdominal pain. He continues to have diarrhea but as many as 4 stools per day but on some days he only has one formed stool. He has discharge or seepage on days when he has to go multiple times. He denies melena or rectal bleeding.  Current Medications: Current Outpatient Prescriptions  Medication Sig Dispense Refill  . amLODipine-atorvastatin (CADUET) 5-40 MG per tablet Take 1 tablet by mouth daily.        Marland Kitchen aspirin 81 MG tablet Take 81 mg by mouth daily.        . bisoprolol-hydrochlorothiazide (ZIAC) 2.5-6.25 MG per tablet Take 1 tablet by mouth daily.        . Dutasteride-Tamsulosin HCl (JALYN) 0.5-0.4 MG CAPS Take by mouth daily.        . ferrous sulfate 325 (65 FE) MG tablet Take 325 mg by mouth daily with breakfast.        . Ibuprofen (ADVIL) 200 MG CAPS Take by mouth. As Needed       . insulin detemir (LEVEMIR) 100 UNIT/ML injection Inject 14 Units into the skin at bedtime. Patient is taking 10 units in the morning and 14 units at bedtime      . metFORMIN (GLUMETZA) 1000 MG (MOD) 24 hr tablet Take 1,000 mg by mouth 2 (two) times daily.       . niacin (NIASPAN) 1000 MG CR tablet Take 1,000 mg by mouth at bedtime.        Marland Kitchen omeprazole (PRILOSEC) 20 MG capsule Take 20 mg by mouth daily.        . ramipril (ALTACE) 10 MG capsule Take by mouth daily.        . saxagliptin HCl (ONGLYZA) 2.5 MG TABS tablet  Take 2.5 mg by mouth daily.           Objective: Blood pressure 130/80, pulse 76, temperature 98.4 F (36.9 C), temperature source Oral, resp. rate 20, height 5\' 6"  (1.676 m), weight 185 lb 14.4 oz (84.324 kg). Conjunctiva is pink. Sclera is nonicteric Oropharyngeal mucosa is normal. No neck masses or thyromegaly noted. Cardiac exam with regular rhythm normal S1 and S2. No murmur or gallop noted. Lungs are clear to auscultation. Abdomen is protuberant. It is soft nontender without organomegaly or masses.  No LE edema or clubbing noted.  Labs/studies Results: Lab data from 05/08/2011. Serum iron 49, TIBC 331 and saturation 15%. Serum ferritin 19 CBC from 05/06/2011. H&H 13.7 and 42.3 MCV 86.9 and platelet count 256K   Assessment:  #1. History of iron deficiency anemia. Hemoglobin in September 2012 was normal however serum iron saturation and TIBC are still low. He had EGD colonoscopy and given capsule study over 2 years ago. Suspect his anemia is primarily due to poor iron absorption. We'll leave him on oral iron therapy until iron stores back to normal. #2. GERD. Symptoms well controlled with therapy. #  3. Intermittent diarrhea. Suspect this is secondary to metformin. We'll treat him with low-dose Imodium until he's had a chance to see Dr. Sharin Grave. Maybe does of metformin could be decreased   Plan:  Imodium 1-2 mg by mouth daily in the evening. Try for a few weeks and see if it controls diarrhea. He will have CBC serum iron TIBC saturation and ferritin then he has rest of his blood work with Dr. Sharin Grave in 3 months. He will ask Dr. Patrecia Pace if metformin dose could be reduced. We will contact patient in the receive his blood work in 3 months and plan to see him back in one year.

## 2011-12-08 ENCOUNTER — Encounter (INDEPENDENT_AMBULATORY_CARE_PROVIDER_SITE_OTHER): Payer: Self-pay

## 2012-06-30 ENCOUNTER — Other Ambulatory Visit: Payer: Self-pay | Admitting: Urology

## 2012-07-10 ENCOUNTER — Encounter (HOSPITAL_COMMUNITY): Payer: Self-pay | Admitting: Pharmacy Technician

## 2012-07-11 ENCOUNTER — Other Ambulatory Visit (HOSPITAL_COMMUNITY): Payer: Self-pay | Admitting: Urology

## 2012-07-11 NOTE — Progress Notes (Signed)
ekg 12-07-2011 [facility] on chart Stress test 4-13 2012 [facility] on chart lov note 12-07-2011 dr Tresa Endo cardiology on chart echo 09-23-2008 [facility] on chart

## 2012-07-11 NOTE — Patient Instructions (Addendum)
20  07/12/2012   Your procedure is scheduled on:   07-17-2012  Report to [facility] at     1215   PM.  Call this number if you have problems the morning of surgery: [phone]  Or Presurgical Testing [phone](Daje Stark)    For Cpap use: Bring mask and tubing only.   Do not eat food:After Midnight.  May have clear liquids:up to 6 Hours before arrival. Nothing after : 0800 AM  Clear liquids include soda, tea, black coffee, apple or grape juice, broth.  Take these medicines the morning of surgery with A SIP OF WATER: Amlodipine. Lipitor. Donot take any insulin or Diabetic meds AM of surgery, Short Stay will give you Bisoprolol 2.5 mg orally on arrival.   Do not wear jewelry, make-up or nail polish.  Do not wear lotions, powders, or perfumes. You may wear deodorant.  Do not shave 12 hours prior to first CHG shower(legs and under arms).(face and neck okay.)  Do not bring valuables to the hospital.  Contacts, dentures or bridgework,body piercing,  may not be worn into surgery.  Leave suitcase in the car. After surgery it may be brought to your room.  For patients admitted to the hospital, checkout time is 11:00 AM the day of discharge.   Patients discharged the day of surgery will not be allowed to drive home. Must have responsible person with you x 24 hours once discharged.  Name and phone number of your driver: Jasmine December, spouse/ daughterJoice Lofts [phone] cell  Special Instructions: CHG Shower Use Special Wash: see special instructions.(avoid face and genitals)   Please read over the following fact sheets that you were given: MRSA Information.   Failure to follow these instructions may result in Cancellation of your surgery.   Patient signature_______________________________________________________

## 2012-07-12 ENCOUNTER — Ambulatory Visit (HOSPITAL_COMMUNITY)
Admission: RE | Admit: 2012-07-12 | Discharge: 2012-07-12 | Disposition: A | Discharge status: Home / Self Care | Payer: BC Managed Care – PPO | Source: Ambulatory Visit | Attending: Urology | Admitting: Urology

## 2012-07-12 ENCOUNTER — Encounter (HOSPITAL_COMMUNITY): Payer: Self-pay

## 2012-07-12 ENCOUNTER — Encounter (HOSPITAL_COMMUNITY)
Admission: RE | Admit: 2012-07-12 | Discharge: 2012-07-12 | Disposition: A | Discharge status: Home / Self Care | Payer: BC Managed Care – PPO | Source: Ambulatory Visit | Attending: Urology | Admitting: Urology

## 2012-07-12 DIAGNOSIS — Z87891 Personal history of nicotine dependence: Secondary | ICD-10-CM | POA: Insufficient documentation

## 2012-07-12 DIAGNOSIS — Z01812 Encounter for preprocedural laboratory examination: Secondary | ICD-10-CM | POA: Insufficient documentation

## 2012-07-12 DIAGNOSIS — E119 Type 2 diabetes mellitus without complications: Secondary | ICD-10-CM | POA: Insufficient documentation

## 2012-07-12 DIAGNOSIS — Z01818 Encounter for other preprocedural examination: Secondary | ICD-10-CM | POA: Insufficient documentation

## 2012-07-12 HISTORY — DX: Essential (primary) hypertension: I10

## 2012-07-12 HISTORY — DX: Sleep apnea, unspecified: G47.30

## 2012-07-12 LAB — CBC
MCH: 28.9 pg (ref 26.0–34.0)
MCHC: 32.7 g/dL (ref 30.0–36.0)
Platelets: 258 10*3/uL (ref 150–400)
RBC: 5.05 MIL/uL (ref 4.22–5.81)
RDW: 14.7 % (ref 11.5–15.5)

## 2012-07-12 LAB — BASIC METABOLIC PANEL
BUN: 14 mg/dL (ref 6–23)
Calcium: 10.3 mg/dL (ref 8.4–10.5)
Creatinine, Ser: 0.78 mg/dL (ref 0.50–1.35)
GFR calc non Af Amer: >90 mL/min (ref >90)
Glucose, Bld: 127 mg/dL — ABNORMAL HIGH (ref 70–99)
Sodium: 139 mEq/L (ref 135–145)

## 2012-07-12 LAB — SURGICAL PCR SCREEN: MRSA, PCR: NEGATIVE

## 2012-07-12 NOTE — Pre-Procedure Instructions (Signed)
07-12-12 EKG 6'13 -report with chart. CXR done today.

## 2012-07-14 NOTE — H&P (Signed)
History of Present Illness  Luke [name] is a 62 year old with the following urologic history:  1) BPH/LUTS: He initially presented in April 2011 with baseline symptoms of urinary frequency, urgency, intermittency, weak stream, nocturia, and a sense of incomplete emptying. His most bothersome symptom at baseline was nocturia 3-4 times nightly. Baseline IPSS: 21 Current treatment: Jalyn  2) Prostate cancer screening: He has a history of an elevated PSA when it was found to have increased to 3.37 in April 2011.  It subsequently decreased when rechecked. Family history: None Last PSA: 1.11 (Jan 2013)  3) Erectile dysfunction:  4) Phimosis: He presented in November 2013 with complaints of phimosis.  He was treated with topical steroids. He does have a history of diabetes.  Interval history:  He follows up today for further evaluation of his BPH and voiding symptoms as well as for prostate cancer screening. He continues to take [place] with good subjective results. He continues to have significant benefit compared to baseline. His IPSS today is 5 compared to 21 at baseline. He has noted a little bit more urinary intermittency although this is not particularly bothersome to him.  He also has a complaint of phimosis. He was recently seen by Denna Haggard, [facility] who evaluated him for phimosis. He was placed on topical steroid therapy and has noted mild to moderate improvement although continues to have difficulty retracting his foreskin and has significant pain with attempts to retract his foreskin. He is able to void although sometimes with difficulty related to his phimosis. He notes a modifying factor being a diabetic. He knows no exacerbating factors. He describes his symptom is moderate and slightly improved.   Past Medical History Problems  1. History of  Adult Sleep Apnea 780.57 2. History of  Diabetes Mellitus 250.00 3. History of  Heartburn With Regurgitation 787.1 4. History of  Hypertension  401.9 5. History of  Prostatitis 601.9  Surgical History Problems  1. History of  Knee Replacement Bilateral  Current Meds 1. AmLODIPine Besylate 5 MG Oral Tablet; Therapy: 12Aug2013 to 2. Atorvastatin Calcium 40 MG Oral Tablet; Therapy: 12Aug2013 to 3. Bayer Aspirin 325 MG Oral Tablet; Therapy: (Recorded:15Apr2011) to 4. Bisoprolol-Hydrochlorothiazide 2.5-6.25 MG Oral Tablet; Therapy: 11Aug2011 to 5. Invokana 300 MG Oral Tablet; Therapy: 09Jul2013 to 6. Iron TABS; Therapy: (Recorded:15Apr2011) to 7. Jalyn 0.5-0.4 MG Oral Capsule; TAKE 1 CAPSULE Daily; Therapy: 15Apr2011 to  (Evaluate:09Oct2014)  Requested for: 14Oct2013; Last Rx:14Oct2013 8. Levemir FlexPen 100 UNIT/ML Subcutaneous Solution; Therapy: 30Sep2013 to 9. MetFORMIN HCl TABS; Therapy: (Recorded:15Apr2011) to 10. Niaspan 1000 MG Oral Tablet Extended Release; Therapy: 11Aug2011 to 11. Onglyza TABS; Therapy: (Recorded:15Apr2011) to 12. Ramipril 10 MG Oral Capsule; Therapy: (Recorded:15Apr2011) to 13. Triamcinolone Acetonide 0.1 % External Cream; APPLY  AND RUB  IN A THIN FILM TO   AFFECTED AREAS TWICE DAILY.(AM AND PM); Therapy: 06Nov2013 to (Evaluate:06Dec2013)    Requested for: 06Nov2013; Last Rx:06Nov2013 14. Victoza SOLN; Therapy: (Recorded:15Apr2011) to  Allergies Medication  1. Morphine Derivatives  Family History Problems  1. Paternal history of  Diabetes Mellitus V18.0 Denied  2. Family history of  Prostate Cancer  Social History Problems  1. Marital History - Currently Married 2. Tobacco Use V15.82 Active. 2 PPD. Began in 1975.  Review of Systems  Genitourinary: no dysuria and no hematuria.  Constitutional: no fever.  Cardiovascular: no chest pain.  Respiratory: no shortness of breath.    Vitals Vital Signs [Data Includes: Last 1 Day]  10Jan2014 08:40AM  Blood Pressure: 154 / 82 Temperature:  98 F Heart Rate: 68  Physical Exam Constitutional: Well nourished and well developed . No acute  distress.  ENT:. The ears and nose are normal in appearance.  Neck: The appearance of the neck is normal and no neck mass is present.  Pulmonary: No respiratory distress, normal respiratory rhythm and effort and clear bilateral breath sounds.  Cardiovascular: Heart rate and rhythm are normal . No peripheral edema.  Rectal: Rectal exam demonstrates normal sphincter tone, no tenderness and no masses. Prostate size is estimated to be 50 g. The prostate has no nodularity and is not tender. The left seminal vesicle is nonpalpable. The right seminal vesicle is nonpalpable. The perineum is normal on inspection.  Genitourinary: Examination of the penis demonstrates phimosis, but no discharge, no masses and no lesions. The scrotum is without lesions. The right epididymis is palpably normal and non-tender. The left epididymis is palpably normal and non-tender. The right testis is non-tender and without masses. The left testis is non-tender and without masses. I am unable to fully retract his foreskin to examine his glans penis. He does have some pain with attempts to retract his foreskin.  Lymphatics: The femoral and inguinal nodes are not enlarged or tender.  Skin: Normal skin turgor, no visible rash and no visible skin lesions.  Neuro/Psych:. Mood and affect are appropriate.    Results/Data Urine [Data Includes: Last 1 Day]   10Jan2014  COLOR YELLOW   APPEARANCE CLEAR   SPECIFIC GRAVITY 1.020   pH 5.5   GLUCOSE > 1000 mg/dL  BILIRUBIN NEG   KETONE NEG mg/dL  BLOOD NEG   PROTEIN NEG mg/dL  UROBILINOGEN 0.2 mg/dL  NITRITE NEG   LEUKOCYTE ESTERASE NEG     PVR: 44 cc  Assessment Assessed  1. Benign Prostatic Hyperplasia Localized With Urinary Obstruction With Other Lower Urinary Tract  Symptoms 600.21 2. Male Erectile Disorder Due To Physical Condition 607.84 3. Phimosis 605 4. Visit For: Screening Exam Malignant Neoplasm Prostate V76.44  Plan Benign Prostatic Hyperplasia Localized With  Urinary Obstruction With Other Lower Urinary Tract Symptoms (600.21)  1. Jalyn 0.5-0.4 MG Oral Capsule; TAKE 1 CAPSULE Daily; Therapy: 15Apr2011 to  (Evaluate:05Jan2015)  Requested for: 10Jan2014; Last Rx:10Jan2014 2. PVR U/S  Requested for: Jan 2015 3. Follow-up Year x 1 Office  Follow-up  Requested for: Jan 2015 Health Maintenance (V70.0)  4. UA With REFLEX  Done: 10Jan2014 08:24AM Phimosis (605)  5. Follow-up Schedule Surgery Office  Follow-up  Done: 10Jan2014 Visit For: Screening Exam Malignant Neoplasm Prostate (V76.44)  6. PSA REFLEX TO FREE  Requested for: 10Jan2014  Discussion/Summary  1. BPH/LUTS: He continues to have a stable response to [place]. This prescription has been renewed and he will follow up in one year with a PVR/IPSS questionnaire.  2. Prostate cancer screening: He continues to be a low risk for prostate cancer and would like to continue annual screening.  3. Phimosis: He does have persistent phimosis despite topical steroid therapy. We therefore discussed proceeding with circumcision as an alternative option. I reviewed the potential risks, complications, and expected recovery process associated with this procedure. He does wish to go forward with this procedure and gives his informed consent today.  Cc: Dr. Karleen Hampshire

## 2012-07-17 ENCOUNTER — Encounter (HOSPITAL_COMMUNITY): Payer: Self-pay | Admitting: *Deleted

## 2012-07-17 ENCOUNTER — Encounter (HOSPITAL_COMMUNITY)
Admission: RE | Disposition: A | Discharge status: Home / Self Care | Payer: Self-pay | Source: Ambulatory Visit | Attending: Urology

## 2012-07-17 ENCOUNTER — Encounter (HOSPITAL_COMMUNITY): Payer: Self-pay | Admitting: Registered Nurse

## 2012-07-17 ENCOUNTER — Ambulatory Visit (HOSPITAL_COMMUNITY): Payer: BC Managed Care – PPO | Admitting: Registered Nurse

## 2012-07-17 ENCOUNTER — Ambulatory Visit (HOSPITAL_COMMUNITY)
Admission: RE | Admit: 2012-07-17 | Discharge: 2012-07-17 | Disposition: A | Discharge status: Home / Self Care | Payer: BC Managed Care – PPO | Source: Ambulatory Visit | Attending: Urology | Admitting: Urology

## 2012-07-17 DIAGNOSIS — N401 Enlarged prostate with lower urinary tract symptoms: Secondary | ICD-10-CM | POA: Insufficient documentation

## 2012-07-17 DIAGNOSIS — N478 Other disorders of prepuce: Secondary | ICD-10-CM | POA: Insufficient documentation

## 2012-07-17 DIAGNOSIS — N471 Phimosis: Secondary | ICD-10-CM | POA: Insufficient documentation

## 2012-07-17 DIAGNOSIS — Z7982 Long term (current) use of aspirin: Secondary | ICD-10-CM | POA: Insufficient documentation

## 2012-07-17 DIAGNOSIS — Z79899 Other long term (current) drug therapy: Secondary | ICD-10-CM | POA: Insufficient documentation

## 2012-07-17 DIAGNOSIS — N529 Male erectile dysfunction, unspecified: Secondary | ICD-10-CM | POA: Insufficient documentation

## 2012-07-17 HISTORY — PX: CIRCUMCISION: SHX1350

## 2012-07-17 LAB — GLUCOSE, CAPILLARY

## 2012-07-17 SURGERY — CIRCUMCISION, ADULT
Anesthesia: General | Wound class: Clean Contaminated

## 2012-07-17 MED ORDER — LACTATED RINGERS IV SOLN
INTRAVENOUS | Status: DC
  Administered 2012-07-17: 16:00:00 via INTRAVENOUS

## 2012-07-17 MED ORDER — BUPIVACAINE HCL (PF) 0.25 % IJ SOLN
INTRAMUSCULAR | Status: DC | PRN
  Administered 2012-07-17: 10 mL

## 2012-07-17 MED ORDER — HYDROCODONE-ACETAMINOPHEN 5-325 MG PO TABS: 1.0000 | ORAL_TABLET | Freq: Four times a day (QID) | ORAL | Status: DC | PRN

## 2012-07-17 MED ORDER — ONDANSETRON HCL 4 MG/2ML IJ SOLN
INTRAMUSCULAR | Status: DC | PRN
  Administered 2012-07-17: 4 mg via INTRAVENOUS

## 2012-07-17 MED ORDER — LIDOCAINE HCL 4 % MT SOLN
OROMUCOSAL | Status: DC | PRN
  Administered 2012-07-17: 4 mL via TOPICAL

## 2012-07-17 MED ORDER — FENTANYL CITRATE 0.05 MG/ML IJ SOLN: 25.0000 ug | INTRAMUSCULAR | Status: DC | PRN

## 2012-07-17 MED ORDER — DEXAMETHASONE SODIUM PHOSPHATE 10 MG/ML IJ SOLN
INTRAMUSCULAR | Status: DC | PRN
  Administered 2012-07-17: 10 mg via INTRAVENOUS

## 2012-07-17 MED ORDER — BACITRACIN ZINC 500 UNIT/GM EX OINT
TOPICAL_OINTMENT | CUTANEOUS | Status: AC
  Filled 2012-07-17: qty 15

## 2012-07-17 MED ORDER — SCOPOLAMINE 1 MG/3DAYS TD PT72
MEDICATED_PATCH | TRANSDERMAL | Status: AC
  Filled 2012-07-17: qty 1

## 2012-07-17 MED ORDER — ACETAMINOPHEN 10 MG/ML IV SOLN
INTRAVENOUS | Status: AC
  Filled 2012-07-17: qty 100

## 2012-07-17 MED ORDER — BISOPROLOL FUMARATE 5 MG PO TABS
2.5000 mg | ORAL_TABLET | Freq: Once | ORAL | Status: AC
  Administered 2012-07-17: 2.5 mg via ORAL
  Filled 2012-07-17: qty 0.5

## 2012-07-17 MED ORDER — MIDAZOLAM HCL 5 MG/5ML IJ SOLN
INTRAMUSCULAR | Status: DC | PRN
  Administered 2012-07-17: 2 mg via INTRAVENOUS

## 2012-07-17 MED ORDER — LACTATED RINGERS IV SOLN
INTRAVENOUS | Status: DC
  Administered 2012-07-17: 1000 mL via INTRAVENOUS

## 2012-07-17 MED ORDER — CEFAZOLIN SODIUM-DEXTROSE 2-3 GM-% IV SOLR
2.0000 g | INTRAVENOUS | Status: AC
  Administered 2012-07-17: 2 g via INTRAVENOUS

## 2012-07-17 MED ORDER — PROPOFOL INFUSION 10 MG/ML OPTIME
INTRAVENOUS | Status: DC | PRN
  Administered 2012-07-17: 180 ug/kg/min via INTRAVENOUS

## 2012-07-17 MED ORDER — FENTANYL CITRATE 0.05 MG/ML IJ SOLN
INTRAMUSCULAR | Status: DC | PRN
  Administered 2012-07-17: 50 ug via INTRAVENOUS
  Administered 2012-07-17: 100 ug via INTRAVENOUS
  Administered 2012-07-17: 25 ug via INTRAVENOUS

## 2012-07-17 MED ORDER — LIDOCAINE HCL (CARDIAC) 10 MG/ML IV SOLN
INTRAVENOUS | Status: DC | PRN
  Administered 2012-07-17: 80 mg via INTRAVENOUS

## 2012-07-17 MED ORDER — SCOPOLAMINE 1 MG/3DAYS TD PT72
MEDICATED_PATCH | TRANSDERMAL | Status: DC | PRN
  Administered 2012-07-17: 1 via TRANSDERMAL

## 2012-07-17 MED ORDER — CEFAZOLIN SODIUM-DEXTROSE 2-3 GM-% IV SOLR
INTRAVENOUS | Status: AC
  Filled 2012-07-17: qty 50

## 2012-07-17 MED ORDER — ROCURONIUM BROMIDE 100 MG/10ML IV SOLN
INTRAVENOUS | Status: DC | PRN
  Administered 2012-07-17: 5 mg via INTRAVENOUS

## 2012-07-17 MED ORDER — ACETAMINOPHEN 10 MG/ML IV SOLN
INTRAVENOUS | Status: DC | PRN
  Administered 2012-07-17: 1000 mg via INTRAVENOUS

## 2012-07-17 MED ORDER — SUCCINYLCHOLINE CHLORIDE 20 MG/ML IJ SOLN
INTRAMUSCULAR | Status: DC | PRN
  Administered 2012-07-17: 100 mg via INTRAVENOUS

## 2012-07-17 MED ORDER — BUPIVACAINE HCL (PF) 0.25 % IJ SOLN
INTRAMUSCULAR | Status: AC
  Filled 2012-07-17: qty 30

## 2012-07-17 MED ORDER — PROPOFOL 10 MG/ML IV BOLUS
INTRAVENOUS | Status: DC | PRN
  Administered 2012-07-17: 200 mg via INTRAVENOUS

## 2012-07-17 SURGICAL SUPPLY — 23 items
BLADE SURG 15 STRL LF DISP TIS (BLADE) ×1 IMPLANT
BLADE SURG 15 STRL SS (BLADE) ×1
BNDG COHESIVE 1X5 TAN STRL LF (GAUZE/BANDAGES/DRESSINGS) ×2 IMPLANT
CLOTH BEACON ORANGE TIMEOUT ST (SAFETY) ×2 IMPLANT
COVER SURGICAL LIGHT HANDLE (MISCELLANEOUS) ×2 IMPLANT
DRAPE PED LAPAROTOMY (DRAPES) ×2 IMPLANT
ELECT REM PT RETURN 9FT ADLT (ELECTROSURGICAL) ×2
ELECTRODE REM PT RTRN 9FT ADLT (ELECTROSURGICAL) ×1 IMPLANT
GAUZE SPONGE 4X4 16PLY XRAY LF (GAUZE/BANDAGES/DRESSINGS) ×2 IMPLANT
GAUZE VASELINE 1X8 (GAUZE/BANDAGES/DRESSINGS) ×2 IMPLANT
GLOVE BIOGEL M STRL SZ7.5 (GLOVE) ×2 IMPLANT
GOWN STRL NON-REIN LRG LVL3 (GOWN DISPOSABLE) ×2 IMPLANT
KIT BASIN OR (CUSTOM PROCEDURE TRAY) ×2 IMPLANT
NS IRRIG 1000ML POUR BTL (IV SOLUTION) IMPLANT
PACK BASIC VI WITH GOWN DISP (CUSTOM PROCEDURE TRAY) ×2 IMPLANT
PENCIL BUTTON HOLSTER BLD 10FT (ELECTRODE) ×2 IMPLANT
SUT CHROMIC 3 0 PS 2 (SUTURE) IMPLANT
SUT CHROMIC 3 0 SH 27 (SUTURE) ×4 IMPLANT
SUT SILK 2 0 (SUTURE)
SUT SILK 2-0 18XBRD TIE 12 (SUTURE) IMPLANT
SYR CONTROL 10ML LL (SYRINGE) ×2 IMPLANT
TOWEL OR 17X26 10 PK STRL BLUE (TOWEL DISPOSABLE) ×2 IMPLANT
WATER STERILE IRR 1500ML POUR (IV SOLUTION) IMPLANT

## 2012-07-17 NOTE — Interval H&P Note (Signed)
History and Physical Interval Note:  07/17/2012 12:57 PM  Luke [name]  has presented today for surgery, with the diagnosis of PHIMOSIS  The various methods of treatment have been discussed with the patient and family. After consideration of risks, benefits and other options for treatment, the patient has consented to  Procedure(s) (LRB) with comments: CIRCUMCISION ADULT (N/A) as a surgical intervention .  The patient's history has been reviewed, patient examined, no change in status, stable for surgery.  I have reviewed the patient's chart and labs.  Questions were answered to the patient's satisfaction.     Brenan Modesto,LES

## 2012-07-17 NOTE — Transfer of Care (Signed)
Immediate Anesthesia Transfer of Care Note  Patient: Luke [name]  Procedure(s) Performed: Procedure(s) (LRB) with comments: CIRCUMCISION ADULT (N/A)  Patient Location: PACU  Anesthesia Type:General  Level of Consciousness: awake, alert , oriented and patient cooperative  Airway & Oxygen Therapy: Patient Spontanous Breathing and Patient connected to face mask oxygen  Post-op Assessment: Report given to PACU RN, Post -op Vital signs reviewed and stable and Patient moving all extremities X 4  Post vital signs: stable  Complications: No apparent anesthesia complications

## 2012-07-17 NOTE — Op Note (Signed)
Preoperative diagnosis: Phimosis  Postoperative diagnosis: Phimosis  Procedure: Circumcision  Surgeon: Luke [name], Luke [name].  Anesthesia: General  Complications: None  Specimens: Foreskin  Disposition of specimen: To pathology  Indication: Luke [name] is a 62 year old gentleman with phimosis. He was treated conservatively with topical steroids but had persistent symptoms and after discussing options, he elected to proceed with circumcision. The potential risks, complications, alternative options, and expected recovery process were discussed in detail and informed consent was obtained.  Description of procedure:  The patient was taken to the operating room and a general anesthetic was administered. He was given preoperative antibiotics, placed in the supine position, and prepped and draped in the usual sterile fashion. Next, a preoperative timeout was performed. The proximal and distal incision sites were marked with a marking pen. A #15 blade was then used to incise the skin at the proximal and distal incision sites, The prepuce was then divided dorsally and a needle point cautery was used to remove the foreskin. Electrocautery was used to achieve hemostasis as needed. Reapproximating 3-0 chromic sutures were then placed in 4 quadrants. Through chromic suture was then used to reapproximate the skin edges in between each of these quadrants. A sterile dressing was applied. He tolerated the procedure well without complications.

## 2012-07-17 NOTE — Anesthesia Preprocedure Evaluation (Addendum)
Anesthesia Evaluation  Patient identified by MRN, date of birth, ID band Patient awake    Reviewed: Allergy & Precautions, H&P , NPO status , Patient's Chart, lab work & pertinent test results  Airway Mallampati: II TM Distance: >3 FB Neck ROM: full    Dental No notable dental hx.    Pulmonary sleep apnea , COPDCurrent Smoker,  Mild COPD breath sounds clear to auscultation  Pulmonary exam normal       Cardiovascular Exercise Tolerance: Good hypertension, Pt. on medications Rhythm:regular Rate:Normal     Neuro/Psych negative neurological ROS  negative psych ROS   GI/Hepatic negative GI ROS, Neg liver ROS, GERD-  Medicated and Controlled,  Endo/Other  diabetes, Well Controlled, Type 2, Oral Hypoglycemic Agents  Renal/GU negative Renal ROS  negative genitourinary   Musculoskeletal   Abdominal   Peds  Hematology negative hematology ROS (+)   Anesthesia Other Findings   Reproductive/Obstetrics negative OB ROS                          Anesthesia Physical Anesthesia Plan  ASA: II  Anesthesia Plan: General   Post-op Pain Management:    Induction: Intravenous  Airway Management Planned: Oral ETT  Additional Equipment:   Intra-op Plan:   Post-operative Plan: Extubation in OR  Informed Consent: I have reviewed the patients History and Physical, chart, labs and discussed the procedure including the risks, benefits and alternatives for the proposed anesthesia with the patient or authorized representative who has indicated his/her understanding and acceptance.   Dental Advisory Given  Plan Discussed with: CRNA and Surgeon  Anesthesia Plan Comments:         Anesthesia Quick Evaluation

## 2012-07-17 NOTE — Anesthesia Postprocedure Evaluation (Signed)
  Anesthesia Post-op Note  Patient: Luke [name]  Procedure(s) Performed: Procedure(s) (LRB): CIRCUMCISION ADULT (N/A)  Patient Location: PACU  Anesthesia Type: General  Level of Consciousness: awake and alert   Airway and Oxygen Therapy: Patient Spontanous Breathing  Post-op Pain: mild  Post-op Assessment: Post-op Vital signs reviewed, Patient's Cardiovascular Status Stable, Respiratory Function Stable, Patent Airway and No signs of Nausea or vomiting  Last Vitals:  Filed Vitals:   07/17/12 1515  BP: 113/62  Pulse: 52  Temp:   Resp: 21    Post-op Vital Signs: stable   Complications: No apparent anesthesia complications

## 2012-07-18 ENCOUNTER — Encounter (HOSPITAL_COMMUNITY): Payer: Self-pay | Admitting: Urology

## 2012-08-18 ENCOUNTER — Encounter (INDEPENDENT_AMBULATORY_CARE_PROVIDER_SITE_OTHER): Payer: Self-pay | Admitting: *Deleted

## 2012-09-11 ENCOUNTER — Encounter (INDEPENDENT_AMBULATORY_CARE_PROVIDER_SITE_OTHER): Payer: Self-pay | Admitting: Internal Medicine

## 2012-09-11 ENCOUNTER — Ambulatory Visit (INDEPENDENT_AMBULATORY_CARE_PROVIDER_SITE_OTHER): Payer: BC Managed Care – PPO | Admitting: Internal Medicine

## 2012-09-11 VITALS — BP 128/60 | HR 76 | Temp 98.0°F | Ht 65.0 in | Wt 184.5 lb

## 2012-09-11 DIAGNOSIS — K219 Gastro-esophageal reflux disease without esophagitis: Secondary | ICD-10-CM

## 2012-09-11 DIAGNOSIS — D649 Anemia, unspecified: Secondary | ICD-10-CM

## 2012-09-11 NOTE — Patient Instructions (Addendum)
OV in 1 yr. 

## 2012-09-11 NOTE — Progress Notes (Signed)
Subjective:     Patient ID: Luke [name], male   DOB: Jul 28, 1950, 62 y.o.   MRN: [id number]  HPI Here today for f/u of his GERD. Acid reflux when he eats spicy foods. No abdominal pain.  He is having a BM x 2 day. No melena or bright red rectal bleeding. He is taking his iron every other day. He continues to work.   11/25/2008 Given Capsule. 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 May be that he has more AV malformations to acct for his occult GI bleed from IDA. 11/08/2008: EGD and Colonoscopy: Prepyloric/antral gastritis and a scar but no active ulcer noted. No other lesion noted in the upper GI tract. Normal terminal ileoscopy and colonoscopy.   CBC    Component Value Date/Time   WBC 9.7 07/12/2012 0845   RBC 5.05 07/12/2012 0845   HGB 14.6 07/12/2012 0845   HCT 44.7 07/12/2012 0845   PLT 258 07/12/2012 0845   MCV 88.5 07/12/2012 0845   MCH 28.9 07/12/2012 0845   MCHC 32.7 07/12/2012 0845   RDW 14.7 07/12/2012 0845   LYMPHSABS 1.9 05/06/2011 1119   MONOABS 0.7 05/06/2011 1119   EOSABS 0.4 05/06/2011 1119   BASOSABS 0.1 05/06/2011 1119     Review of Systems see hpi Current Outpatient Prescriptions  Medication Sig Dispense Refill  . amLODipine (NORVASC) 5 MG tablet Take 5 mg by mouth every morning.      Marland Kitchen atorvastatin (LIPITOR) 40 MG tablet Take 40 mg by mouth every morning.      . bisoprolol-hydrochlorothiazide (ZIAC) 2.5-6.25 MG per tablet Take 1 tablet by mouth every morning.       . Canagliflozin (INVOKANA) 300 MG TABS Take 300 mg by mouth every morning.      . Dutasteride-Tamsulosin HCl (JALYN) 0.5-0.4 MG CAPS Take 1 capsule by mouth at bedtime.       . Dutasteride-Tamsulosin HCl (JALYN) 0.5-0.4 MG CAPS Take by mouth.      . ferrous sulfate 325 (65 FE) MG tablet Take 325 mg by mouth every other day.       Marland Kitchen HYDROcodone-acetaminophen (NORCO/VICODIN) 5-325 MG per tablet Take 1-2 tablets by mouth every 6 (six)  hours as needed for pain.  25 tablet  0  . insulin detemir (LEVEMIR) 100 UNIT/ML injection Inject 14 Units into the skin 2 (two) times daily.       . metFORMIN (GLUMETZA) 1000 MG (MOD) 24 hr tablet Take 1,000 mg by mouth 2 (two) times daily.       . niacin (NIASPAN) 1000 MG CR tablet Take 1,000 mg by mouth at bedtime.        . ramipril (ALTACE) 10 MG capsule Take 10 mg by mouth every morning.       . saxagliptin HCl (ONGLYZA) 2.5 MG TABS tablet Take 2.5 mg by mouth every morning.       . calcium carbonate-magnesium hydroxide (ROLAIDS) 334 MG CHEW Chew 1 tablet by mouth daily as needed. For heartburn.       No current facility-administered medications for this visit.   Past Medical History  Diagnosis Date  . Hematochezia 11/19/2009  . Iron deficiency anemia 11/19/2009  . 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 Hypertension   . Sleep apnea     study 20 yrs ago. no cpap used now   Past Surgical History  Procedure Laterality Date  . Colonoscopy  02/14/06    M JENKINS  . Ercp  12/25/1996    ROURK  . Upper gastrointestinal endoscopy  11/08/2008    EGD TCS  . Small bowel givens  11/25/2008  . Joint replacement  07-12-12    2008/2007 -knee replacements  . Circumcision  07/17/2012    Procedure: CIRCUMCISION ADULT;  Surgeon: Crecencio Mc, MD;  Location: [facility];  Service: Urology;  Laterality: N/A;   Allergies  Allergen Reactions  . Morphine And Related Itching        Objective:   Physical Exam  Filed Vitals:   09/11/12 1421  Height: 5\' 5"  (1.651 m)  Weight: 184 lb 8 oz (83.689 kg)   Alert and oriented. Skin warm and dry. Oral mucosa is moist.   . Sclera anicteric, conjunctivae is pink. Thyroid not enlarged. No cervical lymphadenopathy. Lungs clear. Heart regular rate and rhythm.  Abdomen is soft. Bowel sounds are positive. No hepatomegaly. No abdominal masses felt. No tenderness.  No edema to lower extremities.  atient is alert and oriented    Assessment:    GERD: Controlled for the  most part at this time. He knows what foods upset his stomach. Anemia. Normal CBC in January. Denies melena or bright red rectal bleeding.       Plan:    OV in1 yr with a CBC.

## 2012-09-14 ENCOUNTER — Telehealth (INDEPENDENT_AMBULATORY_CARE_PROVIDER_SITE_OTHER): Payer: Self-pay | Admitting: *Deleted

## 2012-09-14 DIAGNOSIS — D649 Anemia, unspecified: Secondary | ICD-10-CM

## 2012-09-14 NOTE — Telephone Encounter (Signed)
Patient is to have CBC in 1 year per Luke [name]

## 2012-09-19 ENCOUNTER — Encounter: Payer: Self-pay | Admitting: *Deleted

## 2012-11-16 ENCOUNTER — Telehealth: Payer: Self-pay | Admitting: Cardiovascular Disease

## 2012-11-20 ENCOUNTER — Telehealth: Payer: Self-pay | Admitting: Cardiovascular Disease

## 2012-11-27 ENCOUNTER — Ambulatory Visit: Payer: BC Managed Care – PPO | Admitting: Cardiovascular Disease

## 2012-12-11 ENCOUNTER — Ambulatory Visit (INDEPENDENT_AMBULATORY_CARE_PROVIDER_SITE_OTHER): Payer: BC Managed Care – PPO | Admitting: Cardiovascular Disease

## 2012-12-11 ENCOUNTER — Other Ambulatory Visit: Payer: Self-pay | Admitting: *Deleted

## 2012-12-11 VITALS — BP 132/60 | HR 59 | Ht 65.0 in | Wt 185.5 lb

## 2012-12-11 DIAGNOSIS — I2510 Atherosclerotic heart disease of native coronary artery without angina pectoris: Secondary | ICD-10-CM

## 2012-12-11 DIAGNOSIS — E785 Hyperlipidemia, unspecified: Secondary | ICD-10-CM

## 2012-12-11 MED ORDER — BISOPROLOL-HYDROCHLOROTHIAZIDE 2.5-6.25 MG PO TABS: 1.0000 | ORAL_TABLET | Freq: Every day | ORAL | Status: DC

## 2012-12-11 MED ORDER — ATORVASTATIN CALCIUM 40 MG PO TABS: 40.0000 mg | ORAL_TABLET | Freq: Every morning | ORAL | Status: DC

## 2012-12-11 MED ORDER — AMLODIPINE BESYLATE 5 MG PO TABS: 5.0000 mg | ORAL_TABLET | Freq: Every morning | ORAL | Status: DC

## 2012-12-11 MED ORDER — NIACIN ER (ANTIHYPERLIPIDEMIC) 1000 MG PO TBCR: 1000.0000 mg | EXTENDED_RELEASE_TABLET | Freq: Every day | ORAL | Status: DC

## 2012-12-11 MED ORDER — RAMIPRIL 10 MG PO CAPS: 10.0000 mg | ORAL_CAPSULE | Freq: Every morning | ORAL | Status: DC

## 2012-12-11 NOTE — Patient Instructions (Signed)
Your physician recommends that you schedule a follow-up appointment in: 1 year  

## 2012-12-13 ENCOUNTER — Encounter: Payer: Self-pay | Admitting: Cardiovascular Disease

## 2012-12-13 DIAGNOSIS — I2510 Atherosclerotic heart disease of native coronary artery without angina pectoris: Secondary | ICD-10-CM | POA: Insufficient documentation

## 2012-12-13 NOTE — Progress Notes (Signed)
Patient ID: Luke [name], male   DOB: 06/07/1951, 62 y.o.   MRN: [id number]     HPI: Luke [name], is a 62 y.o. male who has documented mild coronary artery disease by cardiac catheterization in 2001. This revealed segmental 60-80% stenoses and a small first diagonal vessel of the LAD, 50% mid LAD stenosis, 50% ostial left circumflex stenosis, and 30% ostial and proximal intermediate stenosis. Mild luminal regularity his of his right coronary artery. He has been on aggressive medical therapy since that time and has remained stable.  Additional problems include mixed hyperlipidemia requiring combination therapy, hypertension, and type 2 diabetes mellitus.  I last saw him one year ago over the past year, he has continued to do well with reference to chest pain his blood pressure he believes has been stable. He also has diabetes and he believes this has been controlled.  Past Medical History  Diagnosis Date  . Hematochezia 11/19/2009  . Iron deficiency anemia 11/19/2009  . 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 Hypertension   . Sleep apnea     study 20 yrs ago. no cpap used now  . CAD (coronary artery disease) 10/02/2010    nuclear study show normal perfusion on medical therapy without scar or ischemia. EF 64%  . Diabetes mellitus     type 2   . Hyperlipidemia   . Osteoarthritis     left knee    Past Surgical History  Procedure Laterality Date  . Colonoscopy  02/14/06    M JENKINS  . Ercp  12/25/1996    ROURK  . Upper gastrointestinal endoscopy  11/08/2008    EGD TCS  . Small bowel givens  11/25/2008  . Joint replacement  07-12-12    2008/2007 -knee replacements  . Circumcision  07/17/2012    Procedure: CIRCUMCISION ADULT;  Surgeon: Crecencio Mc, MD;  Location: [facility];  Service: Urology;  Laterality: N/A;  . Cardiac catheterization  08/1999    which revealed a mild coronary artery disease with 60 and 70 and 80% stenosis in a small first diagonal vessel at the LAD, 50% mid LAD stenosis,  50% ostial left circumflrx stenosis, and 40% ostial and proximal intermediate stenosis. he had 10 to 20% irregularities of his mid right coronary artery.    Allergies  Allergen Reactions  . Morphine And Related Itching    Current Outpatient Prescriptions  Medication Sig Dispense Refill  . calcium carbonate-magnesium hydroxide (ROLAIDS) 334 MG CHEW Chew 1 tablet by mouth daily as needed. For heartburn.      . Canagliflozin (INVOKANA) 300 MG TABS Take 300 mg by mouth every morning.      . Dutasteride-Tamsulosin HCl (JALYN) 0.5-0.4 MG CAPS Take 1 capsule by mouth at bedtime.       . ferrous sulfate 325 (65 FE) MG tablet Take 325 mg by mouth every other day.       . insulin detemir (LEVEMIR) 100 UNIT/ML injection Inject 14 Units into the skin 2 (two) times daily.       . metFORMIN (GLUMETZA) 1000 MG (MOD) 24 hr tablet Take 1,000 mg by mouth 2 (two) times daily.       Marland Kitchen amLODipine (NORVASC) 5 MG tablet Take 1 tablet (5 mg total) by mouth every morning.  90 tablet  3  . atorvastatin (LIPITOR) 40 MG tablet Take 1 tablet (40 mg total) by mouth every morning.  90 tablet  3  . bisoprolol-hydrochlorothiazide (ZIAC) 2.5-6.25 MG per tablet Take 1 tablet by mouth daily.  90  tablet  3  . niacin (NIASPAN) 1000 MG CR tablet Take 1 tablet (1,000 mg total) by mouth at bedtime.  90 tablet  3  . ramipril (ALTACE) 10 MG capsule Take 1 capsule (10 mg total) by mouth every morning.  90 capsule  3  . saxagliptin HCl (ONGLYZA) 2.5 MG TABS tablet Take 5 mg by mouth every morning.        No current facility-administered medications for this visit.   Social he is married and has 2 children 3 grandchildren. 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 There is a remote tobacco history having quit over 10 years ago. His alcohol use.  ROS no fevers, chills or night sweats. He denies visual change. No significant weight change. He denies PND or orthopnea. There is no chest pain. He denies GERD symptoms. Denies indigestion. He  denies myalgias arthralgias or rash. Other system review is negative.  PE BP 132/60  Pulse 59  Ht 5\' 5"  (1.651 m)  Wt 185 lb 8 oz (84.142 kg)  BMI 30.87 kg/m2  General: Alert, oriented, no distress.  Skin: normal turgor, no rashes HEENT: Normocephalic, atraumatic. Pupils round and reactive; sclera anicteric;no lid lag. Nose without nasal septal hypertrophy Mouth/Parynx benign; Mallinpatti scale** Neck: No JVD, no carotid briuts Lungs: clear to ausculatation and percussion; no wheezing or rales Heart: RRR, s1 s2 normal 1/6 systolic murmur. Abdomen: Mild central adiposity. soft, nontender; no hepatosplenomehaly, BS+; abdominal aorta nontender and not dilated by palpation. Pulses 2+ Extremities: no clubbing cyanosis or edema, Homan's sign negative  Neurologic: grossly nonfocal  ECG: Sinus rhythm at 59 beats per minute.  LABS:  BMET    Component Value Date/Time   NA 139 07/12/2012 0845   NA 142 08/24/2010   K 4.4 07/12/2012 0845   CL 101 07/12/2012 0845   CO2 28 07/12/2012 0845   GLUCOSE 127* 07/12/2012 0845   BUN 14 07/12/2012 0845   CREATININE 0.78 07/12/2012 0845   CREATININE 0.8 08/24/2010   CALCIUM 10.3 07/12/2012 0845   GFRNONAA >90 07/12/2012 0845   GFRAA >90 07/12/2012 0845     Hepatic Function Panel     Component Value Date/Time   PROT 7.2 06/26/2007 1432   ALBUMIN 4.1 06/26/2007 1432   AST 20 08/24/2010   ALT 20 08/24/2010   ALKPHOS 77 06/26/2007 1432   BILITOT 0.5 06/26/2007 1432     CBC    Component Value Date/Time   WBC 9.7 07/12/2012 0845   RBC 5.05 07/12/2012 0845   HGB 14.6 07/12/2012 0845   HCT 44.7 07/12/2012 0845   PLT 258 07/12/2012 0845   MCV 88.5 07/12/2012 0845   MCH 28.9 07/12/2012 0845   MCHC 32.7 07/12/2012 0845   RDW 14.7 07/12/2012 0845   LYMPHSABS 1.9 05/06/2011 1119   MONOABS 0.7 05/06/2011 1119   EOSABS 0.4 05/06/2011 1119   BASOSABS 0.1 05/06/2011 1119     BNP No results found for this basename: probnp    Lipid Panel  No results found for this  basename: chol, trig, hdl, cholhdl, vldl, ldlcalc     RADIOLOGY: No results found.    ASSESSMENT AND PLAN: From a cardiac standpoint, Luke [name] continues to be stable on his current therapy. We'll try to get blood work that he had from his primary physician. Blood pressure is well-controlled. He's not having angina. Prior LDL is less than 70. As long as he remains stable I will see him in one year for followup evaluation.  Lennette Bihari, MD, [facility]  12/13/2012 11:44 PM

## 2013-06-06 ENCOUNTER — Encounter (INDEPENDENT_AMBULATORY_CARE_PROVIDER_SITE_OTHER): Payer: Self-pay | Admitting: *Deleted

## 2013-08-15 ENCOUNTER — Encounter (INDEPENDENT_AMBULATORY_CARE_PROVIDER_SITE_OTHER): Payer: Self-pay | Admitting: *Deleted

## 2013-08-15 ENCOUNTER — Other Ambulatory Visit (INDEPENDENT_AMBULATORY_CARE_PROVIDER_SITE_OTHER): Payer: Self-pay | Admitting: *Deleted

## 2013-08-15 DIAGNOSIS — D649 Anemia, unspecified: Secondary | ICD-10-CM

## 2013-09-17 LAB — CBC WITH DIFFERENTIAL/PLATELET
BASOS PCT: 1 % (ref 0–1)
Basophils Absolute: 0.1 10*3/uL (ref 0.0–0.1)
EOS ABS: 0.2 10*3/uL (ref 0.0–0.7)
Eosinophils Relative: 2 % (ref 0–5)
HCT: 41 % (ref 39.0–52.0)
HEMOGLOBIN: 14 g/dL (ref 13.0–17.0)
Lymphocytes Relative: 26 % (ref 12–46)
Lymphs Abs: 2.2 10*3/uL (ref 0.7–4.0)
MCH: 29 pg (ref 26.0–34.0)
MCHC: 34.1 g/dL (ref 30.0–36.0)
MCV: 84.9 fL (ref 78.0–100.0)
MONO ABS: 0.8 10*3/uL (ref 0.1–1.0)
MONOS PCT: 10 % (ref 3–12)
NEUTROS PCT: 61 % (ref 43–77)
Neutro Abs: 5.1 10*3/uL (ref 1.7–7.7)
Platelets: 260 10*3/uL (ref 150–400)
RBC: 4.83 MIL/uL (ref 4.22–5.81)
RDW: 15.6 % — AB (ref 11.5–15.5)
WBC: 8.3 10*3/uL (ref 4.0–10.5)

## 2013-09-25 ENCOUNTER — Ambulatory Visit (INDEPENDENT_AMBULATORY_CARE_PROVIDER_SITE_OTHER): Payer: BC Managed Care – PPO | Admitting: Internal Medicine

## 2013-09-25 ENCOUNTER — Encounter (INDEPENDENT_AMBULATORY_CARE_PROVIDER_SITE_OTHER): Payer: Self-pay | Admitting: Internal Medicine

## 2013-09-25 VITALS — BP 118/74 | HR 76 | Temp 98.1°F | Resp 18 | Ht 65.0 in | Wt 182.0 lb

## 2013-09-25 DIAGNOSIS — Z862 Personal history of diseases of the blood and blood-forming organs and certain disorders involving the immune mechanism: Secondary | ICD-10-CM

## 2013-09-25 DIAGNOSIS — K219 Gastro-esophageal reflux disease without esophagitis: Secondary | ICD-10-CM

## 2013-09-25 NOTE — Patient Instructions (Signed)
Hemoccult x1. CBC in 3 months.

## 2013-09-25 NOTE — Progress Notes (Signed)
Presenting complaint;  History of iron deficiency anemia and GERD.  Subjective:  Patient is 63 year old Caucasian male who presents for yearly visit. He was last seen in March 2013 and was doing well. He was on omeprazole which she has stopped. He has history of iron deficiency anemia diagnosed in 2010 when he presented with heme positive stools hematochezia and underwent EGD colonoscopy and given capsule study. He had jejunal AVM. He responded to therapy with iron. He states he is retired. He says his eating habits have improved. He does not have heartburn often. It occurs with certain foods quickly relieved with Rolaids which she does not take very often. He denies abdominal pain melena or rectal bleeding. He has 2-3 formed stools daily. He is not having fecal seepage anymore. He complains of small lump along left side of neck. He noted 6 months ago and it has not increased in size. He does not take OTC NSAIDs. He he has started to walk and he is hoping to lose weight so that some of his diabetic medications could be reduced or discontinued.  Current Medications: Outpatient Encounter Prescriptions as of 09/25/2013  Medication Sig  . amLODipine (NORVASC) 5 MG tablet Take 1 tablet (5 mg total) by mouth every morning.  . calcium carbonate-magnesium hydroxide (ROLAIDS) 334 MG CHEW Chew 1 tablet by mouth daily as needed. For heartburn.  . Canagliflozin (INVOKANA) 300 MG TABS Take 300 mg by mouth every morning.  . Dutasteride-Tamsulosin HCl (JALYN) 0.5-0.4 MG CAPS Take 1 capsule by mouth at bedtime.   . ferrous sulfate 325 (65 FE) MG tablet Take 325 mg by mouth every other day.   . insulin detemir (LEVEMIR) 100 UNIT/ML injection Inject 14 Units into the skin 2 (two) times daily.   . metFORMIN (GLUMETZA) 1000 MG (MOD) 24 hr tablet Take 1,000 mg by mouth 2 (two) times daily.   . niacin (NIASPAN) 1000 MG CR tablet Take 1 tablet (1,000 mg total) by mouth at bedtime.  . ramipril (ALTACE) 10 MG capsule  Take 1 capsule (10 mg total) by mouth every morning.  . saxagliptin HCl (ONGLYZA) 2.5 MG TABS tablet Take 5 mg by mouth every morning.   . [DISCONTINUED] atorvastatin (LIPITOR) 40 MG tablet Take 1 tablet (40 mg total) by mouth every morning.  . [DISCONTINUED] bisoprolol-hydrochlorothiazide (ZIAC) 2.5-6.25 MG per tablet Take 1 tablet by mouth daily.     Objective: Blood pressure 118/74, pulse 76, temperature 98.1 F (36.7 C), temperature source Oral, resp. rate 18, height 5\' 5"  (1.651 m), weight 182 lb (82.555 kg). Patient is alert and in no acute distress. Conjunctiva is pink. Sclera is nonicteric Oropharyngeal mucosa is normal. He has small subcutaneous lump at left mid neck anteriorly which is soft and freely mobile and appears to be small lymph node. No thyromegaly noted. Cardiac exam with regular rhythm normal S1 and S2. No murmur or gallop noted. Lungs are clear to auscultation. Abdomen is protuberant but soft and nontender without organomegaly or masses.  No LE edema or clubbing noted.  Labs/studies Results: CBC from 09/17/2013. WBC 8.3, H&H 14 and 41 and platelet count 260K.    Assessment:  #1. History of iron deficiency anemia. Hemoglobin has remained normal for over 2 years. We'll therefore discontinue iron pills. #2. GERD. Symptoms well controlled with dietary measures and when necessary use of Rolaids. #3. Patient's last colonoscopy was in May 2010. Family history is negative for CRC and his next screening would be in 5 years.  Plan:  Discontinue  ferrous sulfate. Hemoccult x 1. CBC in 3 months. Office visit in one year.

## 2013-09-28 ENCOUNTER — Telehealth (INDEPENDENT_AMBULATORY_CARE_PROVIDER_SITE_OTHER): Payer: Self-pay | Admitting: *Deleted

## 2013-09-28 NOTE — Telephone Encounter (Signed)
   Diagnosis:    Result(s)   Card 1: : Negative            Completed by: Thomas Hoff, LPN   HEMOCCULT SENSA DEVELOPER: LOT#:  [id number] EXPIRATION DATE: 2016-05   HEMOCCULT SENSA CARD:  [id number]#:[id number]   EXPIRATION DATE: 04/16   CARD CONTROL RESULTS:  POSITIVE: Positive NEGATIVE: Negative    ADDITIONAL COMMENTS:

## 2013-10-01 NOTE — Telephone Encounter (Signed)
Another message left on patient's voicemail that the hemoccult was negative.

## 2013-10-01 NOTE — Telephone Encounter (Signed)
Stool is guaiac negative. Patient aware. 

## 2013-10-10 NOTE — Telephone Encounter (Signed)
Encounter has been closed--TP 10/11/13 

## 2013-10-10 NOTE — Telephone Encounter (Signed)
Encounter has been closed--TP 10/10/13 

## 2013-11-22 ENCOUNTER — Ambulatory Visit (INDEPENDENT_AMBULATORY_CARE_PROVIDER_SITE_OTHER): Payer: BC Managed Care – PPO | Admitting: Cardiovascular Disease

## 2013-11-22 ENCOUNTER — Encounter: Payer: Self-pay | Admitting: Cardiovascular Disease

## 2013-11-22 VITALS — BP 150/70 | HR 57 | Ht 66.0 in | Wt 185.9 lb

## 2013-11-22 DIAGNOSIS — I2510 Atherosclerotic heart disease of native coronary artery without angina pectoris: Secondary | ICD-10-CM

## 2013-11-22 DIAGNOSIS — E119 Type 2 diabetes mellitus without complications: Secondary | ICD-10-CM

## 2013-11-22 DIAGNOSIS — I10 Essential (primary) hypertension: Secondary | ICD-10-CM

## 2013-11-22 DIAGNOSIS — E785 Hyperlipidemia, unspecified: Secondary | ICD-10-CM

## 2013-11-22 MED ORDER — AMLODIPINE BESYLATE 5 MG PO TABS: 5.0000 mg | ORAL_TABLET | Freq: Every day | ORAL | Status: DC

## 2013-11-22 MED ORDER — ATORVASTATIN CALCIUM 40 MG PO TABS: 40.0000 mg | ORAL_TABLET | Freq: Every day | ORAL | Status: DC

## 2013-11-22 NOTE — Progress Notes (Signed)
Patient ID: Luke [name], male   DOB: 15-Jun-1951, 63 y.o.   MRN: [id number]      HPI: Luke [name] is a 63 y.o. male who presents to the office for a one-year followup evaluation.   [name] has documented mild coronary artery disease by cardiac catheterization in 2001 which revealed segmental 60-80% stenoses and a small first diagonal vessel of the LAD, 50% mid LAD stenosis, 50% ostial left circumflex stenosis, and 30% ostial and proximal intermediate stenosis. Mild luminal regularity his of his right coronary artery. He has been on aggressive medical therapy since that time and has remained stable.  Additional problems include mixed hyperlipidemia requiring combination therapy, hypertension, and type 2 diabetes mellitus.  Over the past month, he was no longer able to obtain samples of Niaspan and discontinued this medication.  Over the past year, he has continued to do well with reference to chest pain or anginal symptoms.  He has been on Caduet 5/40, tramadol, 10 mg in addition to Ziac 2.5/6.25 mg for blood pressure control.  He now is on multiple drug therapy for his diabetes including invokana, Levemir, metformin, as well as onglyza.  He tells me he will be seeing Dr. Laural Golden over the next month.  Laboratory will be rechecked by him.   Past Medical History  Diagnosis Date  . Hematochezia 11/19/2009  . Iron deficiency anemia 11/19/2009  . 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 Hypertension   . Sleep apnea     study 20 yrs ago. no cpap used now  . CAD (coronary artery disease) 10/02/2010    nuclear study show normal perfusion on medical therapy without scar or ischemia. EF 64%  . Diabetes mellitus     type 2   . Hyperlipidemia   . Osteoarthritis     left knee    Past Surgical History  Procedure Laterality Date  . Colonoscopy  02/14/06    M JENKINS  . Ercp  12/25/1996    ROURK  . Upper gastrointestinal endoscopy  11/08/2008    EGD TCS  . Small bowel givens  11/25/2008  . Joint  replacement  07-12-12    2008/2007 -knee replacements  . Circumcision  07/17/2012    Procedure: CIRCUMCISION ADULT;  Surgeon: Dutch Gray, MD;  Location: [facility];  Service: Urology;  Laterality: N/A;  . Cardiac catheterization  08/1999    which revealed a mild coronary artery disease with 60 and 70 and 80% stenosis in a small first diagonal vessel at the LAD, 50% mid LAD stenosis, 50% ostial left circumflrx stenosis, and 40% ostial and proximal intermediate stenosis. he had 10 to 20% irregularities of his mid right coronary artery.    Allergies  Allergen Reactions  . Morphine And Related Itching    Current Outpatient Prescriptions  Medication Sig Dispense Refill  . aspirin 81 MG tablet Take 81 mg by mouth daily.      . bisoprolol-hydrochlorothiazide (ZIAC) 2.5-6.25 MG per tablet Take 1 tablet by mouth daily.      . Canagliflozin (INVOKANA) 300 MG TABS Take 300 mg by mouth every morning.      . insulin detemir (LEVEMIR) 100 UNIT/ML injection Inject 14 Units into the skin 2 (two) times daily.       . metFORMIN (GLUMETZA) 1000 MG (MOD) 24 hr tablet Take 1,000 mg by mouth 2 (two) times daily.       . niacin (NIASPAN) 1000 MG CR tablet Take 1 tablet (1,000 mg total) by mouth at bedtime.  [place]  tablet  3  . ramipril (ALTACE) 10 MG capsule Take 1 capsule (10 mg total) by mouth every morning.  90 capsule  3  . saxagliptin HCl (ONGLYZA) 2.5 MG TABS tablet Take 5 mg by mouth every morning.       Marland Kitchen amLODipine (NORVASC) 5 MG tablet Take 1 tablet (5 mg total) by mouth daily.  180 tablet  3  . atorvastatin (LIPITOR) 40 MG tablet Take 1 tablet (40 mg total) by mouth daily.  90 tablet  3   No current facility-administered medications for this visit.   Social he is married and has 2 children 3 grandchildren. 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 There is a remote tobacco history having quit over 10 years ago. His alcohol use.  ROS General: Negative; No fevers, chills, or night sweats;  HEENT: Negative; No  changes in vision or hearing, sinus congestion, difficulty swallowing Pulmonary: Negative; No cough, wheezing, shortness of breath, hemoptysis Cardiovascular: Negative; No chest pain, presyncope, syncope, palpatations GI: Positive for GERD No nausea, vomiting, diarrhea, or abdominal pain GU: Negative; No dysuria, hematuria, or difficulty voiding Musculoskeletal: Negative; no myalgias, joint pain, or weakness Hematologic/Oncology: Negative; no easy bruising, bleeding Endocrine: Positive for diabetes; no heat/cold intolerance;  Neuro: Negative; no changes in balance, headaches Skin: Negative; No rashes or skin lesions Psychiatric: Negative; No behavioral problems, depression Sleep: Negative; No snoring, daytime sleepiness, hypersomnolence, bruxism, restless legs, hypnogognic hallucinations, no cataplexy Other comprehensive 14 point system review is negative.   PE BP 150/70  Pulse 57  Ht 5\' 6"  (1.676 m)  Wt 185 lb 14.4 oz (84.324 kg)  BMI 30.02 kg/m2  Repeat 140/78 General: Alert, oriented, no distress.  Skin: normal turgor, no rashes HEENT: Normocephalic, atraumatic. Pupils round and reactive; sclera anicteric;no lid lag. Nose without nasal septal hypertrophy Mouth/Parynx benign; Mallinpatti scale 2 Neck: No JVD, no carotid bruits with normal carotid upstroke Lungs: clear to ausculatation and percussion; no wheezing or rales Chest wall: Nontender to palpation Heart: RRR, s1 s2 normal 1/6 systolic murmur.  No diastolic murmur.  No S3 or S4 gallop.  No rubs, thrills or heaves. Abdomen: Mild central adiposity. soft, nontender; no hepatosplenomehaly, BS+; abdominal aorta nontender and not dilated by palpation. Back: No CVA tenderness Pulses 2+ Extremities: no clubbing cyanosis or edema, Homan's sign negative  Neurologic: grossly nonfocal Psychological: Normal affect and mood  ECG (independently read by me): Sinus rhythm 57 beats per minute.  Normal intervals.  No ST changes.  ECG:  Sinus rhythm at 59 beats per minute.  LABS:  BMET    Component Value Date/Time   NA 139 07/12/2012 0845   NA 142 08/24/2010   K 4.4 07/12/2012 0845   CL 101 07/12/2012 0845   CO2 28 07/12/2012 0845   GLUCOSE 127* 07/12/2012 0845   BUN 14 07/12/2012 0845   CREATININE 0.78 07/12/2012 0845   CREATININE 0.8 08/24/2010   CALCIUM 10.3 07/12/2012 0845   GFRNONAA >90 07/12/2012 0845   GFRAA >90 07/12/2012 0845     Hepatic Function Panel     Component Value Date/Time   PROT 7.2 06/26/2007 1432   ALBUMIN 4.1 06/26/2007 1432   AST 20 08/24/2010   ALT 20 08/24/2010   ALKPHOS 77 06/26/2007 1432   BILITOT 0.5 06/26/2007 1432     CBC    Component Value Date/Time   WBC 8.3 09/17/2013 0809   RBC 4.83 09/17/2013 0809   HGB 14.0 09/17/2013 0809   HCT 41.0 09/17/2013 0809  PLT 260 09/17/2013 0809   MCV 84.9 09/17/2013 0809   MCH 29.0 09/17/2013 0809   MCHC 34.1 09/17/2013 0809   RDW 15.6* 09/17/2013 0809   LYMPHSABS 2.2 09/17/2013 0809   MONOABS 0.8 09/17/2013 0809   EOSABS 0.2 09/17/2013 0809   BASOSABS 0.1 09/17/2013 0809     BNP No results found for this basename: probnp    Lipid Panel  No results found for this basename: chol,  trig,  hdl,  cholhdl,  vldl,  ldlcalc     RADIOLOGY: No results found.    ASSESSMENT AND PLAN: Mr. Beske is now 63 years old and is 14 years status post cardiac catheterization, which demonstrated mild/moderate CAD.  He has been on medical therapy and has been without recurrent symptomatology.  His last nuclear perfusion study was in April 2012, which showed normal perfusion. He does have a history of hypertension.  His blood pressure today initially was 150/70, repeat was 140/78 on his current dose of amlodipine 10 mg, Ziac 6.5/6.25 in addition to ramipril, 10 mg.  He will be undergoing repeat laboratory over the next several weeks.  Alas that these be forwarded for my review.  He is now off niacin.  Hopefully, his lipids will remain stable.  In attempt to reduce cost, I will  change his Caduet to the individual generics including amlodipine, 5 mg atorvastatin 40 mg.  I have recommended he increase his activity level.  As long as he remains stable, I will see him in one year for cardiology reevaluation.  Time spent: 25 minutes   Troy Sine, MD, [facility]  11/22/2013 4:09 PM

## 2013-11-22 NOTE — Patient Instructions (Signed)
Your physician has recommended you make the following change in your medication: STOP THE CADUET.  Start the new prescriptions for the amlodipine and atorvastatin. These has already been sent to the pharmacy.  Your physician recommends that you schedule a follow-up appointment in: 1 year.

## 2014-03-07 ENCOUNTER — Other Ambulatory Visit: Payer: Self-pay | Admitting: Cardiovascular Disease

## 2014-03-08 NOTE — Telephone Encounter (Signed)
Rx was sent to pharmacy electronically. 

## 2014-06-18 ENCOUNTER — Encounter (INDEPENDENT_AMBULATORY_CARE_PROVIDER_SITE_OTHER): Payer: Self-pay | Admitting: *Deleted

## 2014-09-20 DIAGNOSIS — M5030 Other cervical disc degeneration, unspecified cervical region: Secondary | ICD-10-CM | POA: Diagnosis not present

## 2014-09-20 DIAGNOSIS — M9903 Segmental and somatic dysfunction of lumbar region: Secondary | ICD-10-CM | POA: Diagnosis not present

## 2014-09-20 DIAGNOSIS — M5136 Other intervertebral disc degeneration, lumbar region: Secondary | ICD-10-CM | POA: Diagnosis not present

## 2014-09-20 DIAGNOSIS — M9901 Segmental and somatic dysfunction of cervical region: Secondary | ICD-10-CM | POA: Diagnosis not present

## 2014-09-20 DIAGNOSIS — M9902 Segmental and somatic dysfunction of thoracic region: Secondary | ICD-10-CM | POA: Diagnosis not present

## 2014-09-22 DIAGNOSIS — M4806 Spinal stenosis, lumbar region: Secondary | ICD-10-CM | POA: Diagnosis not present

## 2014-09-22 DIAGNOSIS — M5416 Radiculopathy, lumbar region: Secondary | ICD-10-CM | POA: Diagnosis not present

## 2014-09-26 ENCOUNTER — Ambulatory Visit (INDEPENDENT_AMBULATORY_CARE_PROVIDER_SITE_OTHER): Payer: BC Managed Care – PPO | Admitting: Internal Medicine

## 2014-09-30 ENCOUNTER — Ambulatory Visit (INDEPENDENT_AMBULATORY_CARE_PROVIDER_SITE_OTHER): Payer: Self-pay | Admitting: Internal Medicine

## 2014-09-30 DIAGNOSIS — M5030 Other cervical disc degeneration, unspecified cervical region: Secondary | ICD-10-CM | POA: Diagnosis not present

## 2014-09-30 DIAGNOSIS — M5136 Other intervertebral disc degeneration, lumbar region: Secondary | ICD-10-CM | POA: Diagnosis not present

## 2014-09-30 DIAGNOSIS — M9901 Segmental and somatic dysfunction of cervical region: Secondary | ICD-10-CM | POA: Diagnosis not present

## 2014-09-30 DIAGNOSIS — M9903 Segmental and somatic dysfunction of lumbar region: Secondary | ICD-10-CM | POA: Diagnosis not present

## 2014-09-30 DIAGNOSIS — M9902 Segmental and somatic dysfunction of thoracic region: Secondary | ICD-10-CM | POA: Diagnosis not present

## 2014-10-01 ENCOUNTER — Encounter (INDEPENDENT_AMBULATORY_CARE_PROVIDER_SITE_OTHER): Payer: Self-pay | Admitting: Internal Medicine

## 2014-10-01 ENCOUNTER — Ambulatory Visit (INDEPENDENT_AMBULATORY_CARE_PROVIDER_SITE_OTHER): Payer: Medicare Other | Admitting: Internal Medicine

## 2014-10-01 VITALS — BP 104/48 | HR 72 | Temp 97.3°F | Ht 65.0 in | Wt 176.7 lb

## 2014-10-01 DIAGNOSIS — D509 Iron deficiency anemia, unspecified: Secondary | ICD-10-CM

## 2014-10-01 DIAGNOSIS — K219 Gastro-esophageal reflux disease without esophagitis: Secondary | ICD-10-CM

## 2014-10-01 MED ORDER — PANTOPRAZOLE SODIUM 40 MG PO TBEC: 40.0000 mg | DELAYED_RELEASE_TABLET | Freq: Every day | ORAL | Status: DC

## 2014-10-01 NOTE — Patient Instructions (Signed)
Will have labs at [facility]. OV in 3 month Rx for Protonix 40mg  daily Stool card x 1

## 2014-10-01 NOTE — Progress Notes (Signed)
Subjective:    Patient ID: Luke [name], male    DOB: 07-17-1950, 64 y.o.   MRN: [id number]  HPI Here today for f/u. Last seen by Dr. Laural Golden April of 2016 for IDA. He tells me he is doing well. He is retired. He says  He tells me he has a burp and it hangs. He denies dysphagia.  Taking Omeprazole for indigestion.Indigestion better since starting the Omeprezole. No trouble eating steak. Appetite is good. He has lost some weight which was intentional (6 pounds).  Having 2-3 stools a day and are loose. No melena or BRRB. Denies any abdominal pain.   CBC    Component Value Date/Time   WBC 8.3 09/17/2013 0809   RBC 4.83 09/17/2013 0809   HGB 14.0 09/17/2013 0809   HCT 41.0 09/17/2013 0809   PLT 260 09/17/2013 0809   MCV 84.9 09/17/2013 0809   MCH 29.0 09/17/2013 0809   MCHC 34.1 09/17/2013 0809   RDW 15.6* 09/17/2013 0809   LYMPHSABS 2.2 09/17/2013 0809   MONOABS 0.8 09/17/2013 0809   EOSABS 0.2 09/17/2013 0809   BASOSABS 0.1 09/17/2013 0809       11/08/2008 EGD/ED: Prepyloric/antral gastritis and a scar but no active bleeding noted. No other lesions noted in the upper GI tract. Normal terminal ileum.  Patient also underwent a Given Capsule. He had jejunal AVM. Marland Kitchen   Review of Systems  Married. Two children in good health. Past Medical History  Diagnosis Date  . Hematochezia 11/19/2009  . Iron deficiency anemia 11/19/2009  . 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 Hypertension   . Sleep apnea     study 20 yrs ago. no cpap used now  . CAD (coronary artery disease) 10/02/2010    nuclear study show normal perfusion on medical therapy without scar or ischemia. EF 64%  . Diabetes mellitus     type 2   . Hyperlipidemia   . Osteoarthritis     left knee    Past Surgical History  Procedure Laterality Date  . Colonoscopy  02/14/06    M JENKINS  . Ercp  12/25/1996    ROURK  . Upper gastrointestinal endoscopy  11/08/2008    EGD TCS  . Small bowel givens  11/25/2008  . Joint  replacement  07-12-12    2008/2007 -knee replacements  . Circumcision  07/17/2012    Procedure: CIRCUMCISION ADULT;  Surgeon: Dutch Gray, MD;  Location: [facility];  Service: Urology;  Laterality: N/A;  . Cardiac catheterization  08/1999    which revealed a mild coronary artery disease with 60 and 70 and 80% stenosis in a small first diagonal vessel at the LAD, 50% mid LAD stenosis, 50% ostial left circumflrx stenosis, and 40% ostial and proximal intermediate stenosis. he had 10 to 20% irregularities of his mid right coronary artery.    Allergies  Allergen Reactions  . Morphine And Related Itching    Current Outpatient Prescriptions on File Prior to Visit  Medication Sig Dispense Refill  . aspirin 81 MG tablet Take 81 mg by mouth daily.    Marland Kitchen atorvastatin (LIPITOR) 40 MG tablet Take 1 tablet (40 mg total) by mouth daily. 90 tablet 3  . bisoprolol-hydrochlorothiazide (ZIAC) 2.5-6.25 MG per tablet Take 1 tablet by mouth daily.    . Canagliflozin (INVOKANA) 300 MG TABS Take 300 mg by mouth every morning.    . metFORMIN (GLUMETZA) 1000 MG (MOD) 24 hr tablet Take 1,000 mg by mouth 2 (two) times daily.     Marland Kitchen  saxagliptin HCl (ONGLYZA) 2.5 MG TABS tablet Take 5 mg by mouth every morning.      No current facility-administered medications on file prior to visit.        Objective:   Physical ExamBlood pressure 104/48, pulse 72, temperature 97.3 F (36.3 C), height 5\' 5"  (1.651 m), weight 176 lb 11.2 oz (80.151 kg).  Alert and oriented. Skin warm and dry. Oral mucosa is moist.   . Sclera anicteric, conjunctivae is pink. Thyroid not enlarged. No cervical lymphadenopathy. Lungs clear. Heart regular rate and rhythm.  Abdomen is soft. Bowel sounds are positive. No hepatomegaly. No abdominal masses felt. No tenderness.  No edema to lower extremities. Patient is alert and oriented.       Assessment & Plan:  Hx of anemia. Last CBC in March of 2015 was normal. GERD; Will start on Protonix 40mg   daily. Stool card x 1 sent with patient,. He will have labs drawn at [facility]. OV in 3 months.

## 2014-10-02 DIAGNOSIS — M9902 Segmental and somatic dysfunction of thoracic region: Secondary | ICD-10-CM | POA: Diagnosis not present

## 2014-10-02 DIAGNOSIS — Z6828 Body mass index (BMI) 28.0-28.9, adult: Secondary | ICD-10-CM | POA: Diagnosis not present

## 2014-10-02 DIAGNOSIS — E782 Mixed hyperlipidemia: Secondary | ICD-10-CM | POA: Diagnosis not present

## 2014-10-02 DIAGNOSIS — M9903 Segmental and somatic dysfunction of lumbar region: Secondary | ICD-10-CM | POA: Diagnosis not present

## 2014-10-02 DIAGNOSIS — M5030 Other cervical disc degeneration, unspecified cervical region: Secondary | ICD-10-CM | POA: Diagnosis not present

## 2014-10-02 DIAGNOSIS — M9901 Segmental and somatic dysfunction of cervical region: Secondary | ICD-10-CM | POA: Diagnosis not present

## 2014-10-02 DIAGNOSIS — M5136 Other intervertebral disc degeneration, lumbar region: Secondary | ICD-10-CM | POA: Diagnosis not present

## 2014-10-02 DIAGNOSIS — I10 Essential (primary) hypertension: Secondary | ICD-10-CM | POA: Diagnosis not present

## 2014-10-02 DIAGNOSIS — E1165 Type 2 diabetes mellitus with hyperglycemia: Secondary | ICD-10-CM | POA: Diagnosis not present

## 2014-10-02 DIAGNOSIS — M5416 Radiculopathy, lumbar region: Secondary | ICD-10-CM | POA: Diagnosis not present

## 2014-10-02 DIAGNOSIS — E663 Overweight: Secondary | ICD-10-CM | POA: Diagnosis not present

## 2014-10-03 ENCOUNTER — Inpatient Hospital Stay (HOSPITAL_COMMUNITY)
Admission: EM | Admit: 2014-10-03 | Discharge: 2014-10-05 | DRG: 346 | Disposition: A | Discharge status: Home / Self Care | Payer: Medicare Other | Attending: Internal Medicine | Admitting: Internal Medicine

## 2014-10-03 ENCOUNTER — Encounter (HOSPITAL_COMMUNITY): Payer: Self-pay | Admitting: Emergency Medicine

## 2014-10-03 DIAGNOSIS — E1129 Type 2 diabetes mellitus with other diabetic kidney complication: Secondary | ICD-10-CM

## 2014-10-03 DIAGNOSIS — F17210 Nicotine dependence, cigarettes, uncomplicated: Secondary | ICD-10-CM | POA: Diagnosis present

## 2014-10-03 DIAGNOSIS — D649 Anemia, unspecified: Secondary | ICD-10-CM

## 2014-10-03 DIAGNOSIS — D500 Iron deficiency anemia secondary to blood loss (chronic): Secondary | ICD-10-CM | POA: Diagnosis not present

## 2014-10-03 DIAGNOSIS — Z7982 Long term (current) use of aspirin: Secondary | ICD-10-CM

## 2014-10-03 DIAGNOSIS — I2510 Atherosclerotic heart disease of native coronary artery without angina pectoris: Secondary | ICD-10-CM | POA: Diagnosis not present

## 2014-10-03 DIAGNOSIS — K219 Gastro-esophageal reflux disease without esophagitis: Secondary | ICD-10-CM | POA: Diagnosis present

## 2014-10-03 DIAGNOSIS — K921 Melena: Secondary | ICD-10-CM

## 2014-10-03 DIAGNOSIS — Z96653 Presence of artificial knee joint, bilateral: Secondary | ICD-10-CM | POA: Diagnosis present

## 2014-10-03 DIAGNOSIS — E538 Deficiency of other specified B group vitamins: Secondary | ICD-10-CM | POA: Diagnosis present

## 2014-10-03 DIAGNOSIS — M1990 Unspecified osteoarthritis, unspecified site: Secondary | ICD-10-CM | POA: Diagnosis present

## 2014-10-03 DIAGNOSIS — E119 Type 2 diabetes mellitus without complications: Secondary | ICD-10-CM | POA: Diagnosis present

## 2014-10-03 DIAGNOSIS — I10 Essential (primary) hypertension: Secondary | ICD-10-CM | POA: Diagnosis present

## 2014-10-03 DIAGNOSIS — K922 Gastrointestinal hemorrhage, unspecified: Secondary | ICD-10-CM | POA: Diagnosis not present

## 2014-10-03 DIAGNOSIS — T39395A Adverse effect of other nonsteroidal anti-inflammatory drugs [NSAID], initial encounter: Secondary | ICD-10-CM

## 2014-10-03 DIAGNOSIS — Z6829 Body mass index (BMI) 29.0-29.9, adult: Secondary | ICD-10-CM

## 2014-10-03 DIAGNOSIS — K210 Gastro-esophageal reflux disease with esophagitis: Secondary | ICD-10-CM

## 2014-10-03 DIAGNOSIS — R42 Dizziness and giddiness: Secondary | ICD-10-CM | POA: Diagnosis not present

## 2014-10-03 DIAGNOSIS — E785 Hyperlipidemia, unspecified: Secondary | ICD-10-CM | POA: Diagnosis present

## 2014-10-03 DIAGNOSIS — R195 Other fecal abnormalities: Secondary | ICD-10-CM | POA: Diagnosis not present

## 2014-10-03 DIAGNOSIS — K449 Diaphragmatic hernia without obstruction or gangrene: Secondary | ICD-10-CM | POA: Diagnosis present

## 2014-10-03 DIAGNOSIS — G4733 Obstructive sleep apnea (adult) (pediatric): Secondary | ICD-10-CM | POA: Diagnosis present

## 2014-10-03 DIAGNOSIS — E663 Overweight: Secondary | ICD-10-CM | POA: Diagnosis present

## 2014-10-03 DIAGNOSIS — Z885 Allergy status to narcotic agent status: Secondary | ICD-10-CM

## 2014-10-03 DIAGNOSIS — Z66 Do not resuscitate: Secondary | ICD-10-CM | POA: Diagnosis present

## 2014-10-03 DIAGNOSIS — R197 Diarrhea, unspecified: Secondary | ICD-10-CM | POA: Diagnosis present

## 2014-10-03 DIAGNOSIS — D509 Iron deficiency anemia, unspecified: Secondary | ICD-10-CM | POA: Diagnosis present

## 2014-10-03 LAB — ABO/RH: ABO/RH(D): A POS

## 2014-10-03 LAB — CBC WITH DIFFERENTIAL/PLATELET
BASOS ABS: 0.1 10*3/uL (ref 0.0–0.1)
Basophils Relative: 1 % (ref 0–1)
EOS ABS: 0.1 10*3/uL (ref 0.0–0.7)
Eosinophils Relative: 1 % (ref 0–5)
HCT: 21.5 % — ABNORMAL LOW (ref 39.0–52.0)
Hemoglobin: 5.5 g/dL — CL (ref 13.0–17.0)
LYMPHS PCT: 18 % (ref 12–46)
Lymphs Abs: 1 10*3/uL (ref 0.7–4.0)
MCH: 15.2 pg — AB (ref 26.0–34.0)
MCHC: 25.6 g/dL — AB (ref 30.0–36.0)
MCV: 59.4 fL — AB (ref 78.0–100.0)
Monocytes Absolute: 0.7 10*3/uL (ref 0.1–1.0)
Monocytes Relative: 12 % (ref 3–12)
Neutro Abs: 3.9 10*3/uL (ref 1.7–7.7)
Neutrophils Relative %: 68 % (ref 43–77)
PLATELETS: 352 10*3/uL (ref 150–400)
RBC: 3.62 MIL/uL — ABNORMAL LOW (ref 4.22–5.81)
RDW: 20.6 % — AB (ref 11.5–15.5)
WBC: 5.8 10*3/uL (ref 4.0–10.5)

## 2014-10-03 LAB — COMPREHENSIVE METABOLIC PANEL
ALT: 13 U/L (ref 0–53)
AST: 20 U/L (ref 0–37)
Albumin: 4 g/dL (ref 3.5–5.2)
Alkaline Phosphatase: 62 U/L (ref 39–117)
Anion gap: 10 (ref 5–15)
BILIRUBIN TOTAL: 0.7 mg/dL (ref 0.3–1.2)
BUN: 10 mg/dL (ref 6–23)
CO2: 26 mmol/L (ref 19–32)
CREATININE: 0.83 mg/dL (ref 0.50–1.35)
Calcium: 9.2 mg/dL (ref 8.4–10.5)
Chloride: 101 mmol/L (ref 96–112)
GFR calc Af Amer: >90 mL/min (ref >90)
GFR calc non Af Amer: >90 mL/min (ref >90)
Glucose, Bld: 134 mg/dL — ABNORMAL HIGH (ref 70–99)
POTASSIUM: 4.1 mmol/L (ref 3.5–5.1)
Sodium: 137 mmol/L (ref 135–145)
Total Protein: 6.9 g/dL (ref 6.0–8.3)

## 2014-10-03 LAB — GLUCOSE, CAPILLARY
GLUCOSE-CAPILLARY: 92 mg/dL (ref 70–99)
Glucose-Capillary: 97 mg/dL (ref 70–99)

## 2014-10-03 LAB — RETICULOCYTES
RBC.: 3.79 MIL/uL — AB (ref 4.22–5.81)
RETIC CT PCT: 0.8 % (ref 0.4–3.1)
Retic Count, Absolute: 30.3 10*3/uL (ref 19.0–186.0)

## 2014-10-03 LAB — POC OCCULT BLOOD, ED: Fecal Occult Bld: POSITIVE — AB

## 2014-10-03 LAB — PREPARE RBC (CROSSMATCH)

## 2014-10-03 MED ORDER — ASPIRIN 81 MG PO TABS: 81.0000 mg | ORAL_TABLET | Freq: Every day | ORAL | Status: DC

## 2014-10-03 MED ORDER — TAMSULOSIN HCL 0.4 MG PO CAPS
0.4000 mg | ORAL_CAPSULE | Freq: Every day | ORAL | Status: DC
  Administered 2014-10-03 – 2014-10-04 (×2): 0.4 mg via ORAL
  Filled 2014-10-03 (×3): qty 1

## 2014-10-03 MED ORDER — FINASTERIDE 5 MG PO TABS
5.0000 mg | ORAL_TABLET | Freq: Every day | ORAL | Status: DC
  Administered 2014-10-03 – 2014-10-05 (×3): 5 mg via ORAL
  Filled 2014-10-03 (×3): qty 1

## 2014-10-03 MED ORDER — ATORVASTATIN CALCIUM 40 MG PO TABS
40.0000 mg | ORAL_TABLET | Freq: Every day | ORAL | Status: DC
  Administered 2014-10-03 – 2014-10-04 (×2): 40 mg via ORAL
  Filled 2014-10-03 (×3): qty 1

## 2014-10-03 MED ORDER — ASPIRIN EC 81 MG PO TBEC
81.0000 mg | DELAYED_RELEASE_TABLET | Freq: Every day | ORAL | Status: DC
  Administered 2014-10-03 – 2014-10-04 (×2): 81 mg via ORAL
  Filled 2014-10-03 (×2): qty 1

## 2014-10-03 MED ORDER — RAMIPRIL 10 MG PO CAPS
10.0000 mg | ORAL_CAPSULE | Freq: Every day | ORAL | Status: DC
  Administered 2014-10-03 – 2014-10-05 (×3): 10 mg via ORAL
  Filled 2014-10-03 (×3): qty 1

## 2014-10-03 MED ORDER — INSULIN ASPART 100 UNIT/ML ~~LOC~~ SOLN
0.0000 [IU] | SUBCUTANEOUS | Status: DC
  Administered 2014-10-05 (×2): 2 [IU] via SUBCUTANEOUS
  Administered 2014-10-05: 1 [IU] via SUBCUTANEOUS

## 2014-10-03 MED ORDER — SODIUM CHLORIDE 0.9 % IV SOLN
INTRAVENOUS | Status: DC
  Administered 2014-10-03 – 2014-10-04 (×3): via INTRAVENOUS

## 2014-10-03 MED ORDER — SODIUM CHLORIDE 0.9 % IV SOLN
10.0000 mL/h | Freq: Once | INTRAVENOUS | Status: AC
  Administered 2014-10-03: 10 mL/h via INTRAVENOUS

## 2014-10-03 MED ORDER — SODIUM CHLORIDE 0.9 % IV SOLN
INTRAVENOUS | Status: DC
  Administered 2014-10-03 (×2): via INTRAVENOUS

## 2014-10-03 MED ORDER — PANTOPRAZOLE SODIUM 40 MG IV SOLR
40.0000 mg | Freq: Two times a day (BID) | INTRAVENOUS | Status: DC
  Administered 2014-10-03 – 2014-10-05 (×5): 40 mg via INTRAVENOUS
  Filled 2014-10-03 (×7): qty 40

## 2014-10-03 NOTE — H&P (Signed)
[facility] History and Physical  Luke [name] DGU:[id number] DOB: Dec 16, 1950 DOA: 10/03/2014  Referring physician: ED PCP: Leonides Grills, MD  Specialists: GI-Dr. Benson Norway  Chief Complaint: Bleeding  HPI:  64 y/o ? CAD s/p Cath 08/1999--Medical therapies-Last [place] 4/12 no scar, Htn, Aoiron def, OSA [no CPAP], Prior pancreatitis s/p ERCP 1998, DM ty 2, HLD, Phimosis s/p Circ+bpH, s/p L TKR 12/08, presented to [facility] 4/14 after being seen routinely at both GI office and primary care physician Dr. Lorie Apley office-patient apparently looked pale to Dr. Collene Mares and blood work was done Hemoglobin came back at 5.5 Patient states that over the past 2-3 weeks he's been feeling increasingly dizzy, cold and endorses increased preference for ice [PICA] He is usually limited by radiculopathy in his left leg with a pinched nerve but has also been more sedentary than usual whereas he normally goes outside and does a lot of yard work He is had dark unformed/black stool for the past 2-3 weeks almost every time he is passed stool. Mice and chest vancomycin nausea, - vomiting, - antecedent ill contacts, - blurred vision, - unilateral weakness + Dizziness and woozy headed feeling, + lightheadedness for the past 3 days - Vomiting blood, - dysuria, - rash, - radiating or crushing chest pain    Prior GI history 11/08/2008 EGD/ED: Prepyloric/antral gastritis and a scar but no active bleeding noted. No other lesions noted in the upper GI tract. Normal terminal ileum.   Patient also underwent a Given Capsule. He had jejunal AVM. Marland Kitchen  Review of Systems: A 14 point ROS was performed and is negative except as noted in the HPI   Past Medical History  Diagnosis Date  . Hematochezia 11/19/2009  . Iron deficiency anemia 11/19/2009  . 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 Hypertension   . Sleep apnea     study 20 yrs ago. no cpap used now  . CAD (coronary artery disease) 10/02/2010    nuclear study  show normal perfusion on medical therapy without scar or ischemia. EF 64%  . Diabetes mellitus     type 2   . Hyperlipidemia   . Osteoarthritis     left knee   Past Surgical History  Procedure Laterality Date  . Colonoscopy  02/14/06    M JENKINS  . Ercp  12/25/1996    ROURK  . Upper gastrointestinal endoscopy  11/08/2008    EGD TCS  . Small bowel givens  11/25/2008  . Joint replacement  07-12-12    2008/2007 -knee replacements  . Circumcision  07/17/2012    Procedure: CIRCUMCISION ADULT;  Surgeon: Dutch Gray, MD;  Location: [facility];  Service: Urology;  Laterality: N/A;  . Cardiac catheterization  08/1999    which revealed a mild coronary artery disease with 60 and 70 and 80% stenosis in a small first diagonal vessel at the LAD, 50% mid LAD stenosis, 50% ostial left circumflrx stenosis, and 40% ostial and proximal intermediate stenosis. he had 10 to 20% irregularities of his mid right coronary artery.   Social History:  History   Social History Narrative    Allergies  Allergen Reactions  . Morphine And Related Itching    Family History  Problem Relation Age of Onset  . Healthy Sister   . Healthy Brother   . Healthy Sister   . Healthy Brother   . Healthy Brother   . Healthy Daughter   . Healthy Daughter      Prior to Admission medications   Medication Sig  Start Date End Date Taking? Authorizing Provider  aspirin 81 MG tablet Take 81 mg by mouth daily.   Yes Historical Provider, MD  atorvastatin (LIPITOR) 40 MG tablet Take 1 tablet (40 mg total) by mouth daily. 11/22/13  Yes Troy Sine, MD  bisoprolol-hydrochlorothiazide [facility]) 2.5-6.25 MG per tablet Take 1 tablet by mouth daily.   Yes Historical Provider, MD  Canagliflozin (INVOKANA) 300 MG TABS Take 300 mg by mouth every morning.   Yes Historical Provider, MD  etodolac (LODINE XL) 400 MG 24 hr tablet Take 400 mg by mouth 2 (two) times daily.   Yes Historical Provider, MD  finasteride (PROSCAR) 5 MG tablet Take 5 mg  by mouth daily.   Yes Historical Provider, MD  metFORMIN (GLUMETZA) 1000 MG (MOD) 24 hr tablet Take 1,000 mg by mouth 2 (two) times daily.    Yes Historical Provider, MD  omeprazole (PRILOSEC) 20 MG capsule Take 20 mg by mouth daily.   Yes Historical Provider, MD  ramipril (ALTACE) 10 MG capsule Take 10 mg by mouth daily.   Yes Historical Provider, MD  saxagliptin HCl (ONGLYZA) 5 MG TABS tablet Take 5 mg by mouth 2 (two) times daily.   Yes Historical Provider, MD  tamsulosin (FLOMAX) 0.4 MG CAPS capsule Take 0.4 mg by mouth.   Yes Historical Provider, MD  pantoprazole (PROTONIX) 40 MG tablet Take 1 tablet (40 mg total) by mouth daily. Patient not taking: Reported on 10/03/2014 10/01/14   Butch Penny, NP   Physical Exam: Filed Vitals:   10/03/14 1000 10/03/14 1030 10/03/14 1041 10/03/14 1100  BP: 149/70 130/60 130/60 126/63  Pulse: 80 70 74 76  Temp:      TempSrc:      Resp: 10 20 20 19   SpO2: 100% 98% 99% 100%   Alert pleasant male Significant pallor, no icterus Mallampati 3, no lymphadenopathy, no thyromegaly, no bruit CTAP, no added sound Abdomen soft, moves equally with respiration, bowel sounds diminished, mild epigastric tenderness on deep palpation, no organomegaly, rectal deferred No lower extremity edema,  cranial nerves grossly intact, power 5/5 bilaterally  Labs on Admission:  Basic Metabolic Panel:  Recent Labs Lab 10/03/14 1010  NA 137  K 4.1  CL 101  CO2 26  GLUCOSE 134*  BUN 10  CREATININE 0.83  CALCIUM 9.2   Liver Function Tests:  Recent Labs Lab 10/03/14 1010  AST 20  ALT 13  ALKPHOS 62  BILITOT 0.7  PROT 6.9  ALBUMIN 4.0   No results for input(s): LIPASE, AMYLASE in the last 168 hours. No results for input(s): AMMONIA in the last 168 hours. CBC:  Recent Labs Lab 10/03/14 1010  WBC 5.8  NEUTROABS 3.9  HGB 5.5*  HCT 21.5*  MCV 59.4*  PLT 352   Cardiac Enzymes: No results for input(s): CKTOTAL, CKMB, CKMBINDEX, TROPONINI in the last  168 hours.  BNP (last 3 results) No results for input(s): BNP in the last 8760 hours.  ProBNP (last 3 results) No results for input(s): PROBNP in the last 8760 hours.  CBG: No results for input(s): GLUCAP in the last 168 hours.  Radiological Exams on Admission: No results found.   Assessment/Plan Principal Problem:   Acute probable upper GI bleed-Occult ?-Unclear etiology. Could be upper cause. With his dysphagia he may benefit from upper endoscopy. I have consulted Dr. Benson Norway of GI who will scope him tomorrow. He is okay for clear liquid diet today so we will give him that. Note that patient  is on Lodine which is an NSAID and we will hold that for right now We will also hold temporarily his aspirin 81 mg.   Dysphagia-significant smoking history and past raising concern for? Barrett's. Defer to GI Active Problems:   GERD (gastroesophageal reflux disease)-give IV PPI 40 mg twice a day Protonix   IDA (iron deficiency anemia)-taken off of iron apparently 1-2 years ago as patient was doing well and did not seem to need this. We will get iron studies repeated today. He does have a significant microcytosis making Irondeficiency and blood loss anemia more likely   Diarrhea-none present at present time   DM (diabetes mellitus)-patient will be kept on every 4 coverage as he will only be on clear liquid diets. We will hold all of his antecedent medications including Invokana 300, metformin 1 thousand twice a day, Onglyza 5 3 times a day   HTN (hypertension)-we will hold oral Ziac, can continue ramipril 10 daily,   Hyperlipemia-hold atorvastatin 40 daily   CAD (coronary artery disease)-prior history of as above-hold HCTZ component of bisoprolol combination tablet, hold aspirin for now  Discussed patient's care with Dr. Benson Norway of GI who will see the patient in consult however patient will probably not get scoped until tomorrow    Time spent: 78 DO NOT RESUSCITATE Stable for MedSurg--2 large bore  IV access requested by emergency room  Verlon Au [facility] Pager [phone]  If 7PM-7AM, please contact night-coverage [email or web address] Password [facility] 10/03/2014, 11:44 AM

## 2014-10-03 NOTE — Consult Note (Signed)
Unassigned Consult  Reason for Consult: Symptomatic anemia and melena Referring Physician: [facility]  Sheppard Penton HPI: This is a 64 year old male with a PMH of IDA from a GI source, HTN, CAD, DM 2, and CAD who was identified to have an HGB of 5.5 g/dL.  The patient has a history of a GI bleed and he was evaluated by Dr. Laural Golden in [place].  It is reported that he had some antral scarring in the antrum and a capsule endoscopy revealed a jejunal AVM in 2010, but I do not have the actual reports.  For the past 2-3 weeks the patient he states having melena with every bowel movement.  He has an associated dizziness, but no reports of hematochezia or hematemesis.  He also denies any issues with chest pain, SOB, or MI.  The patient denies any bleeding issues over the past 6 years.  His HGB was normal at 14 g/dL last year.  He rarely takes NSAIDs and there is no family history of colon cancer.  He was just evaluated by Dr. Laural Golden on Tuesday and Dr. Laural Golden wanted to obtain a CBC, however, he wanted to wait until his PCP, Dr. Collene Mares checked his blood work.  It was at this time, yesterday, that his blood was drawn and revealed the severe anemia.  Past Medical History  Diagnosis Date  . Hematochezia 11/19/2009  . Iron deficiency anemia 11/19/2009  . 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 Hypertension   . Sleep apnea     study 20 yrs ago. no cpap used now  . CAD (coronary artery disease) 10/02/2010    nuclear study show normal perfusion on medical therapy without scar or ischemia. EF 64%  . Diabetes mellitus     type 2   . Hyperlipidemia   . Osteoarthritis     left knee    Past Surgical History  Procedure Laterality Date  . Colonoscopy  02/14/06    M JENKINS  . Ercp  12/25/1996    ROURK  . Upper gastrointestinal endoscopy  11/08/2008    EGD TCS  . Small bowel givens  11/25/2008  . Joint replacement  07-12-12    2008/2007 -knee replacements  . Circumcision  07/17/2012   Procedure: CIRCUMCISION ADULT;  Surgeon: Dutch Gray, MD;  Location: [facility];  Service: Urology;  Laterality: N/A;  . Cardiac catheterization  08/1999    which revealed a mild coronary artery disease with 60 and 70 and 80% stenosis in a small first diagonal vessel at the LAD, 50% mid LAD stenosis, 50% ostial left circumflrx stenosis, and 40% ostial and proximal intermediate stenosis. he had 10 to 20% irregularities of his mid right coronary artery.    Family History  Problem Relation Age of Onset  . Healthy Sister   . Healthy Brother   . Healthy Sister   . Healthy Brother   . Healthy Brother   . Healthy Daughter   . Healthy Daughter     Social History:  reports that he has been smoking Cigarettes.  He has never used smokeless tobacco. He reports that he does not drink alcohol or use illicit drugs.  Allergies:  Allergies  Allergen Reactions  . Morphine And Related Itching    Medications:  Scheduled: . pantoprazole (PROTONIX) IV  40 mg Intravenous Q12H   Continuous: . sodium chloride 10 mL/hr at 10/03/14 1209    Results for orders placed or performed during the hospital encounter of 10/03/14 (from the past 24 hour(s))  CBC with Differential/Platelet     Status: Abnormal   Collection Time: 10/03/14 10:10 AM  Result Value Ref Range   WBC 5.8 4.0 - 10.5 K/uL   RBC 3.62 (L) 4.22 - 5.81 MIL/uL   Hemoglobin 5.5 (LL) 13.0 - 17.0 g/dL   HCT 21.5 (L) 39.0 - 52.0 %   MCV 59.4 (L) 78.0 - 100.0 fL   MCH 15.2 (L) 26.0 - 34.0 pg   MCHC 25.6 (L) 30.0 - 36.0 g/dL   RDW 20.6 (H) 11.5 - 15.5 %   Platelets 352 150 - 400 K/uL   Neutrophils Relative % 68 43 - 77 %   Lymphocytes Relative 18 12 - 46 %   Monocytes Relative 12 3 - 12 %   Eosinophils Relative 1 0 - 5 %   Basophils Relative 1 0 - 1 %   Neutro Abs 3.9 1.7 - 7.7 K/uL   Lymphs Abs 1.0 0.7 - 4.0 K/uL   Monocytes Absolute 0.7 0.1 - 1.0 K/uL   Eosinophils Absolute 0.1 0.0 - 0.7 K/uL   Basophils Absolute 0.1 0.0 - 0.1 K/uL   RBC  Morphology ELLIPTOCYTES   Comprehensive metabolic panel     Status: Abnormal   Collection Time: 10/03/14 10:10 AM  Result Value Ref Range   Sodium 137 135 - 145 mmol/L   Potassium 4.1 3.5 - 5.1 mmol/L   Chloride 101 96 - 112 mmol/L   CO2 26 19 - 32 mmol/L   Glucose, Bld 134 (H) 70 - 99 mg/dL   BUN 10 6 - 23 mg/dL   Creatinine, Ser 0.83 0.50 - 1.35 mg/dL   Calcium 9.2 8.4 - 10.5 mg/dL   Total Protein 6.9 6.0 - 8.3 g/dL   Albumin 4.0 3.5 - 5.2 g/dL   AST 20 0 - 37 U/L   ALT 13 0 - 53 U/L   Alkaline Phosphatase 62 39 - 117 U/L   Total Bilirubin 0.7 0.3 - 1.2 mg/dL   GFR calc non Af Amer >90 >90 mL/min   GFR calc Af Amer >90 >90 mL/min   Anion gap 10 5 - 15  Type and screen     Status: None (Preliminary result)   Collection Time: 10/03/14 10:10 AM  Result Value Ref Range   ABO/RH(D) A POS    Antibody Screen NEG    Sample Expiration 10/06/2014    Unit Number [id number]    Blood Component Type RED CELLS,LR    Unit division 00    Status of Unit ISSUED    Transfusion Status OK TO TRANSFUSE    Crossmatch Result Compatible    Unit Number [id number]    Blood Component Type RED CELLS,LR    Unit division 00    Status of Unit ALLOCATED    Transfusion Status OK TO TRANSFUSE    Crossmatch Result Compatible   ABO/Rh     Status: None   Collection Time: 10/03/14 10:10 AM  Result Value Ref Range   ABO/RH(D) A POS   POC occult blood, ED RN will collect     Status: Abnormal   Collection Time: 10/03/14 10:36 AM  Result Value Ref Range   Fecal Occult Bld POSITIVE (A) NEGATIVE  Prepare RBC     Status: None   Collection Time: 10/03/14 11:08 AM  Result Value Ref Range   Order Confirmation ORDER PROCESSED BY BLOOD BANK      No results found.  ROS:  As stated above in the HPI otherwise negative.  Blood  pressure 127/69, pulse 70, temperature 98.6 F (37 C), temperature source Oral, resp. rate 16, SpO2 100 %.    PE: Gen: NAD, Alert and Oriented HEENT:  [place]/AT, EOMI Neck: Supple,  no LAD Lungs: CTA Bilaterally CV: RRR without M/G/R ABM: Soft, NTND, +BS Ext: No C/C/E  Assessment/Plan: 1) Iron Deficiency Anemia. 2) Melena. 3) Heme positive stool.   The patient will undergo an enteroscopy tomorrow.  If this is negative I will repeat his capsule endoscopy.  In cases such as this patient, a repeat capsule endoscopy has the highest chance of identifying a bleeding source.  Plan: 1) Enteroscopy +/- capsule endoscopy tomorrow. 2) agree with blood transfusion. Tasheka Houseman D 10/03/2014, 2:00 PM

## 2014-10-03 NOTE — ED Provider Notes (Signed)
CSN: [id number]     Arrival date & time 10/03/14  0945 History   First MD Initiated Contact with Patient 10/03/14 [phone]     Chief Complaint  Patient presents with  . Anemia     (Consider location/radiation/quality/duration/timing/severity/associated sxs/prior Treatment) Patient is a 64 y.o. male presenting with anemia. The history is provided by the patient.  Anemia Pertinent negatives include no chest pain, no abdominal pain, no headaches and no shortness of breath.   patient referred in by primary care provider for low hemoglobin blood test. Patient in the past had trouble with the probable GI blood loss and anemia. Required blood transfusion in the past. Was on iron. Currently no longer on iron. Patient has noticed some black stool on and off for the past several weeks. No evidence of any active bleeding or red blood. Patient without abdominal pain. No shortness of breath no chest pain. Patient has been feeling a little bit lightheaded and has been feeling more fatigued than usual.  Past Medical History  Diagnosis Date  . Hematochezia 11/19/2009  . Iron deficiency anemia 11/19/2009  . 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 Hypertension   . Sleep apnea     study 20 yrs ago. no cpap used now  . CAD (coronary artery disease) 10/02/2010    nuclear study show normal perfusion on medical therapy without scar or ischemia. EF 64%  . Diabetes mellitus     type 2   . Hyperlipidemia   . Osteoarthritis     left knee   Past Surgical History  Procedure Laterality Date  . Colonoscopy  02/14/06    M JENKINS  . Ercp  12/25/1996    ROURK  . Upper gastrointestinal endoscopy  11/08/2008    EGD TCS  . Small bowel givens  11/25/2008  . Joint replacement  07-12-12    2008/2007 -knee replacements  . Circumcision  07/17/2012    Procedure: CIRCUMCISION ADULT;  Surgeon: Dutch Gray, MD;  Location: [facility];  Service: Urology;  Laterality: N/A;  . Cardiac catheterization  08/1999    which revealed a mild  coronary artery disease with 60 and 70 and 80% stenosis in a small first diagonal vessel at the LAD, 50% mid LAD stenosis, 50% ostial left circumflrx stenosis, and 40% ostial and proximal intermediate stenosis. he had 10 to 20% irregularities of his mid right coronary artery.   Family History  Problem Relation Age of Onset  . Healthy Sister   . Healthy Brother   . Healthy Sister   . Healthy Brother   . Healthy Brother   . Healthy Daughter   . Healthy Daughter    History  Substance Use Topics  . Smoking status: Current Every Day Smoker    Types: Cigarettes  . Smokeless tobacco: Never Used     Comment: patient smokes 1/2 pack per day  . Alcohol Use: No    Review of Systems  Constitutional: Positive for fatigue. Negative for fever.  HENT: Negative for congestion.   Eyes: Negative for visual disturbance.  Respiratory: Negative for shortness of breath.   Cardiovascular: Negative for chest pain.  Gastrointestinal: Negative for abdominal pain.  Genitourinary: Negative for dysuria and hematuria.  Musculoskeletal: Negative for back pain.  Skin: Negative for rash.  Neurological: Positive for light-headedness. Negative for syncope and headaches.  Hematological: Does not bruise/bleed easily.  Psychiatric/Behavioral: Negative for confusion.      Allergies  Morphine and related  Home Medications   Prior to Admission medications  Medication Sig Start Date End Date Taking? Authorizing Provider  aspirin 81 MG tablet Take 81 mg by mouth daily.   Yes Historical Provider, MD  atorvastatin (LIPITOR) 40 MG tablet Take 1 tablet (40 mg total) by mouth daily. 11/22/13  Yes Troy Sine, MD  bisoprolol-hydrochlorothiazide [facility]) 2.5-6.25 MG per tablet Take 1 tablet by mouth daily.   Yes Historical Provider, MD  Canagliflozin (INVOKANA) 300 MG TABS Take 300 mg by mouth every morning.   Yes Historical Provider, MD  etodolac (LODINE XL) 400 MG 24 hr tablet Take 400 mg by mouth 2 (two) times  daily.   Yes Historical Provider, MD  finasteride (PROSCAR) 5 MG tablet Take 5 mg by mouth daily.   Yes Historical Provider, MD  metFORMIN (GLUMETZA) 1000 MG (MOD) 24 hr tablet Take 1,000 mg by mouth 2 (two) times daily.    Yes Historical Provider, MD  omeprazole (PRILOSEC) 20 MG capsule Take 20 mg by mouth daily.   Yes Historical Provider, MD  ramipril (ALTACE) 10 MG capsule Take 10 mg by mouth daily.   Yes Historical Provider, MD  saxagliptin HCl (ONGLYZA) 5 MG TABS tablet Take 5 mg by mouth 2 (two) times daily.   Yes Historical Provider, MD  tamsulosin (FLOMAX) 0.4 MG CAPS capsule Take 0.4 mg by mouth.   Yes Historical Provider, MD  pantoprazole (PROTONIX) 40 MG tablet Take 1 tablet (40 mg total) by mouth daily. Patient not taking: Reported on 10/03/2014 10/01/14   Butch Penny, NP   BP 126/63 mmHg  Pulse 76  Temp(Src) 98.3 F (36.8 C) (Oral)  Resp 19  SpO2 100% Physical Exam  Constitutional: He is oriented to person, place, and time. He appears well-developed and well-nourished. No distress.  HENT:  Head: Normocephalic and atraumatic.  Mouth/Throat: Oropharynx is clear and moist.  Eyes: Conjunctivae and EOM are normal. Pupils are equal, round, and reactive to light.  Neck: Normal range of motion. Neck supple.  Cardiovascular: Normal rate, regular rhythm and normal heart sounds.   Pulmonary/Chest: Effort normal and breath sounds normal. No respiratory distress.  Abdominal: Soft. Bowel sounds are normal. There is no tenderness.  Musculoskeletal: Normal range of motion. He exhibits no edema.  Neurological: He is alert and oriented to person, place, and time. No cranial nerve deficit. He exhibits normal muscle tone. Coordination normal.  Skin: Skin is warm. No pallor.  Surprisingly not pale  Nursing note and vitals reviewed.   ED Course  Procedures (including critical care time) Labs Review Labs Reviewed  CBC WITH DIFFERENTIAL/PLATELET - Abnormal; Notable for the following:     RBC 3.62 (*)    Hemoglobin 5.5 (*)    HCT 21.5 (*)    MCV 59.4 (*)    MCH 15.2 (*)    MCHC 25.6 (*)    RDW 20.6 (*)    All other components within normal limits  COMPREHENSIVE METABOLIC PANEL - Abnormal; Notable for the following:    Glucose, Bld 134 (*)    All other components within normal limits  VITAMIN B12  FOLATE  IRON AND TIBC  FERRITIN  RETICULOCYTES  POC OCCULT BLOOD, ED  TYPE AND SCREEN  PREPARE RBC (CROSSMATCH)  ABO/RH   Results for orders placed or performed during the hospital encounter of 10/03/14  CBC with Differential/Platelet  Result Value Ref Range   WBC 5.8 4.0 - 10.5 K/uL   RBC 3.62 (L) 4.22 - 5.81 MIL/uL   Hemoglobin 5.5 (LL) 13.0 - 17.0 g/dL  HCT 21.5 (L) 39.0 - 52.0 %   MCV 59.4 (L) 78.0 - 100.0 fL   MCH 15.2 (L) 26.0 - 34.0 pg   MCHC 25.6 (L) 30.0 - 36.0 g/dL   RDW 20.6 (H) 11.5 - 15.5 %   Platelets 352 150 - 400 K/uL   Neutrophils Relative % 68 43 - 77 %   Lymphocytes Relative 18 12 - 46 %   Monocytes Relative 12 3 - 12 %   Eosinophils Relative 1 0 - 5 %   Basophils Relative 1 0 - 1 %   Neutro Abs 3.9 1.7 - 7.7 K/uL   Lymphs Abs 1.0 0.7 - 4.0 K/uL   Monocytes Absolute 0.7 0.1 - 1.0 K/uL   Eosinophils Absolute 0.1 0.0 - 0.7 K/uL   Basophils Absolute 0.1 0.0 - 0.1 K/uL   RBC Morphology ELLIPTOCYTES   Comprehensive metabolic panel  Result Value Ref Range   Sodium 137 135 - 145 mmol/L   Potassium 4.1 3.5 - 5.1 mmol/L   Chloride 101 96 - 112 mmol/L   CO2 26 19 - 32 mmol/L   Glucose, Bld 134 (H) 70 - 99 mg/dL   BUN 10 6 - 23 mg/dL   Creatinine, Ser 0.83 0.50 - 1.35 mg/dL   Calcium 9.2 8.4 - 10.5 mg/dL   Total Protein 6.9 6.0 - 8.3 g/dL   Albumin 4.0 3.5 - 5.2 g/dL   AST 20 0 - 37 U/L   ALT 13 0 - 53 U/L   Alkaline Phosphatase 62 39 - 117 U/L   Total Bilirubin 0.7 0.3 - 1.2 mg/dL   GFR calc non Af Amer >90 >90 mL/min   GFR calc Af Amer >90 >90 mL/min   Anion gap 10 5 - 15  Type and screen  Result Value Ref Range   ABO/RH(D) A POS     Antibody Screen NEG    Sample Expiration 10/06/2014    Unit Number [id number]    Blood Component Type RED CELLS,LR    Unit division 00    Status of Unit ALLOCATED    Transfusion Status OK TO TRANSFUSE    Crossmatch Result Compatible    Unit Number [id number]    Blood Component Type RED CELLS,LR    Unit division 00    Status of Unit ALLOCATED    Transfusion Status OK TO TRANSFUSE    Crossmatch Result Compatible   Prepare RBC  Result Value Ref Range   Order Confirmation ORDER PROCESSED BY BLOOD BANK   ABO/Rh  Result Value Ref Range   ABO/RH(D) A POS      Imaging Review No results found.   EKG Interpretation None         CRITICAL CARE Performed by: Fredia Sorrow Total critical care time: 30 Critical care time was exclusive of separately billable procedures and treating other patients. Critical care was necessary to treat or prevent imminent or life-threatening deterioration. Critical care was time spent personally by me on the following activities: development of treatment plan with patient and/or surrogate as well as nursing, discussions with consultants, evaluation of patient's response to treatment, examination of patient, obtaining history from patient or surrogate, ordering and performing treatments and interventions, ordering and review of laboratory studies, ordering and review of radiographic studies, pulse oximetry and re-evaluation of patient's condition.  2 units of blood started here in the emergency department. For the anemia.  MDM   Final diagnoses:  Anemia, unspecified anemia type  Gastrointestinal hemorrhage with melena  Patient with significant anemia based on not test done by primary care provider yesterday. Patient already followed by GI medicine. Patient's had some black stools on and off. This is most likely due to a GI blood loss. Patient had trouble with that in the past with extensive workup without any source found. Patient was then  treated with iron for a period of time. Been off iron lately. Patient will be the lightheadedness. The patient able to ambulate fine. Patient's vital signs without any acute changes. No tachycardia no hypotension. Hemoccult card the brought with patient sent to lab results pending. Patient's hemoglobin is 5.5. We'll require blood transfusion. Patient admitted for transfusion. Patient without any chest pain or shortness of breath. No abdominal pain no bright red blood per rectum or with vomiting.    Fredia Sorrow, MD 10/03/14 1150

## 2014-10-03 NOTE — ED Notes (Signed)
Pt's MD's office in [place] called and said he needed to come in for hbg of 5.3. PT has hx of anemia. Brought hemoccult card with him

## 2014-10-03 NOTE — ED Notes (Signed)
Notified Dr Rogene Houston of critical hgb 5.5.

## 2014-10-04 ENCOUNTER — Encounter (HOSPITAL_COMMUNITY): Payer: Self-pay | Admitting: *Deleted

## 2014-10-04 ENCOUNTER — Encounter (HOSPITAL_COMMUNITY)
Admission: EM | Disposition: A | Discharge status: Home / Self Care | Payer: Self-pay | Source: Home / Self Care | Attending: Internal Medicine

## 2014-10-04 ENCOUNTER — Observation Stay (HOSPITAL_COMMUNITY): Payer: Medicare Other | Admitting: Anesthesiology

## 2014-10-04 DIAGNOSIS — Z66 Do not resuscitate: Secondary | ICD-10-CM | POA: Diagnosis present

## 2014-10-04 DIAGNOSIS — I10 Essential (primary) hypertension: Secondary | ICD-10-CM | POA: Diagnosis not present

## 2014-10-04 DIAGNOSIS — G4733 Obstructive sleep apnea (adult) (pediatric): Secondary | ICD-10-CM | POA: Diagnosis present

## 2014-10-04 DIAGNOSIS — I2510 Atherosclerotic heart disease of native coronary artery without angina pectoris: Secondary | ICD-10-CM | POA: Diagnosis present

## 2014-10-04 DIAGNOSIS — Z885 Allergy status to narcotic agent status: Secondary | ICD-10-CM | POA: Diagnosis not present

## 2014-10-04 DIAGNOSIS — Z6829 Body mass index (BMI) 29.0-29.9, adult: Secondary | ICD-10-CM | POA: Diagnosis not present

## 2014-10-04 DIAGNOSIS — E538 Deficiency of other specified B group vitamins: Secondary | ICD-10-CM

## 2014-10-04 DIAGNOSIS — E663 Overweight: Secondary | ICD-10-CM | POA: Diagnosis present

## 2014-10-04 DIAGNOSIS — D500 Iron deficiency anemia secondary to blood loss (chronic): Secondary | ICD-10-CM | POA: Diagnosis not present

## 2014-10-04 DIAGNOSIS — M1990 Unspecified osteoarthritis, unspecified site: Secondary | ICD-10-CM | POA: Diagnosis present

## 2014-10-04 DIAGNOSIS — K449 Diaphragmatic hernia without obstruction or gangrene: Secondary | ICD-10-CM | POA: Diagnosis present

## 2014-10-04 DIAGNOSIS — E785 Hyperlipidemia, unspecified: Secondary | ICD-10-CM | POA: Diagnosis present

## 2014-10-04 DIAGNOSIS — R195 Other fecal abnormalities: Secondary | ICD-10-CM | POA: Diagnosis not present

## 2014-10-04 DIAGNOSIS — D509 Iron deficiency anemia, unspecified: Secondary | ICD-10-CM | POA: Diagnosis not present

## 2014-10-04 DIAGNOSIS — F17210 Nicotine dependence, cigarettes, uncomplicated: Secondary | ICD-10-CM | POA: Diagnosis present

## 2014-10-04 DIAGNOSIS — Q2733 Arteriovenous malformation of digestive system vessel: Secondary | ICD-10-CM | POA: Diagnosis not present

## 2014-10-04 DIAGNOSIS — Z7982 Long term (current) use of aspirin: Secondary | ICD-10-CM | POA: Diagnosis not present

## 2014-10-04 DIAGNOSIS — E119 Type 2 diabetes mellitus without complications: Secondary | ICD-10-CM | POA: Diagnosis present

## 2014-10-04 DIAGNOSIS — K921 Melena: Secondary | ICD-10-CM | POA: Diagnosis not present

## 2014-10-04 DIAGNOSIS — Z96653 Presence of artificial knee joint, bilateral: Secondary | ICD-10-CM | POA: Diagnosis present

## 2014-10-04 DIAGNOSIS — D649 Anemia, unspecified: Secondary | ICD-10-CM | POA: Diagnosis not present

## 2014-10-04 DIAGNOSIS — K219 Gastro-esophageal reflux disease without esophagitis: Secondary | ICD-10-CM | POA: Diagnosis present

## 2014-10-04 DIAGNOSIS — K5520 Angiodysplasia of colon without hemorrhage: Secondary | ICD-10-CM | POA: Diagnosis not present

## 2014-10-04 HISTORY — PX: ENTEROSCOPY: SHX5533

## 2014-10-04 HISTORY — PX: GIVENS CAPSULE STUDY: SHX5432

## 2014-10-04 LAB — GLUCOSE, CAPILLARY
GLUCOSE-CAPILLARY: 162 mg/dL — AB (ref 70–99)
GLUCOSE-CAPILLARY: 83 mg/dL (ref 70–99)
GLUCOSE-CAPILLARY: 96 mg/dL (ref 70–99)
Glucose-Capillary: 85 mg/dL (ref 70–99)
Glucose-Capillary: 105 mg/dL — ABNORMAL HIGH (ref 70–99)
Glucose-Capillary: 91 mg/dL (ref 70–99)
Glucose-Capillary: 114 mg/dL — ABNORMAL HIGH (ref 70–99)

## 2014-10-04 LAB — CBC
HCT: 23.5 % — ABNORMAL LOW (ref 39.0–52.0)
HCT: 28.8 % — ABNORMAL LOW (ref 39.0–52.0)
Hemoglobin: 6.6 g/dL — CL (ref 13.0–17.0)
Hemoglobin: 8.6 g/dL — ABNORMAL LOW (ref 13.0–17.0)
MCH: 17.8 pg — ABNORMAL LOW (ref 26.0–34.0)
MCH: 20.2 pg — AB (ref 26.0–34.0)
MCHC: 28.1 g/dL — AB (ref 30.0–36.0)
MCHC: 29.9 g/dL — ABNORMAL LOW (ref 30.0–36.0)
MCV: 63.3 fL — ABNORMAL LOW (ref 78.0–100.0)
MCV: 67.8 fL — ABNORMAL LOW (ref 78.0–100.0)
Platelets: 269 10*3/uL (ref 150–400)
Platelets: 264 10*3/uL (ref 150–400)
RBC: 3.71 MIL/uL — AB (ref 4.22–5.81)
RBC: 4.25 MIL/uL (ref 4.22–5.81)
RDW: 22.6 % — ABNORMAL HIGH (ref 11.5–15.5)
RDW: 24.6 % — ABNORMAL HIGH (ref 11.5–15.5)
WBC: 5.1 10*3/uL (ref 4.0–10.5)
WBC: 6.5 10*3/uL (ref 4.0–10.5)

## 2014-10-04 LAB — IRON AND TIBC
IRON: 81 ug/dL (ref 42–165)
Saturation Ratios: 22 % (ref 20–55)
TIBC: 362 ug/dL (ref 215–435)
UIBC: 281 ug/dL (ref 125–400)

## 2014-10-04 LAB — VITAMIN B12: Vitamin B-12: 206 pg/mL — ABNORMAL LOW (ref 211–911)

## 2014-10-04 LAB — PREPARE RBC (CROSSMATCH)

## 2014-10-04 LAB — FERRITIN: Ferritin: 6 ng/mL — ABNORMAL LOW (ref 22–322)

## 2014-10-04 LAB — FOLATE: Folate: 16.2 ng/mL

## 2014-10-04 SURGERY — ENTEROSCOPY
Anesthesia: General

## 2014-10-04 SURGERY — IMAGING PROCEDURE, GI TRACT, INTRALUMINAL, VIA CAPSULE
Anesthesia: LOCAL

## 2014-10-04 MED ORDER — CYANOCOBALAMIN 1000 MCG/ML IJ SOLN
1000.0000 ug | Freq: Every day | INTRAMUSCULAR | Status: DC
  Administered 2014-10-04 – 2014-10-05 (×2): 1000 ug via INTRAMUSCULAR
  Filled 2014-10-04 (×2): qty 1

## 2014-10-04 MED ORDER — PROPOFOL 10 MG/ML IV BOLUS
INTRAVENOUS | Status: DC | PRN
  Administered 2014-10-04: 20 mg via INTRAVENOUS
  Administered 2014-10-04: 30 mg via INTRAVENOUS
  Administered 2014-10-04: 110 mg via INTRAVENOUS

## 2014-10-04 MED ORDER — SODIUM CHLORIDE 0.9 % IV SOLN: INTRAVENOUS | Status: DC

## 2014-10-04 MED ORDER — LIDOCAINE HCL (CARDIAC) 20 MG/ML IV SOLN
INTRAVENOUS | Status: DC | PRN
  Administered 2014-10-04: 50 mg via INTRAVENOUS

## 2014-10-04 MED ORDER — LACTATED RINGERS IV SOLN
INTRAVENOUS | Status: DC
  Administered 2014-10-04: 10:00:00 via INTRAVENOUS

## 2014-10-04 MED ORDER — SUCCINYLCHOLINE CHLORIDE 20 MG/ML IJ SOLN
INTRAMUSCULAR | Status: DC | PRN
  Administered 2014-10-04: 80 mg via INTRAVENOUS

## 2014-10-04 MED ORDER — SODIUM CHLORIDE 0.9 % IV SOLN
Freq: Once | INTRAVENOUS | Status: AC
  Administered 2014-10-04: 05:00:00 via INTRAVENOUS

## 2014-10-04 SURGICAL SUPPLY — 1 items: TOWEL COTTON PACK 4EA (MISCELLANEOUS) ×4 IMPLANT

## 2014-10-04 NOTE — Progress Notes (Signed)
UR completed 

## 2014-10-04 NOTE — Interval H&P Note (Signed)
History and Physical Interval Note:  10/04/2014 9:35 AM  Luke [name]  has presented today for surgery, with the diagnosis of Anemia and melena  The various methods of treatment have been discussed with the patient and family. After consideration of risks, benefits and other options for treatment, the patient has consented to  Procedure(s): ENTEROSCOPY (N/A) as a surgical intervention .  The patient's history has been reviewed, patient examined, no change in status, stable for surgery.  I have reviewed the patient's chart and labs.  Questions were answered to the patient's satisfaction.     Raghav Verrilli D

## 2014-10-04 NOTE — Progress Notes (Addendum)
  [facility] PROGRESS NOTE  Luke [name] LHT:[id number] DOB: [birth year] DOA: 10/03/2014 PCP: Collene Mares, PA-C  Assessment/Plan:  Principal Problem:   GI bleed-Occult: enteroscopy with 2 avm, not source of bleeding.  Capsule endo pending. Vitals stable Active Problems:   GERD (gastroesophageal reflux disease)   IDA (iron deficiency anemia):  Getting unit prbc now. Await repeat h/h. Ferritin low. Also b12 deficient. New dx. Will start IM b12   DM type 2 (diabetes mellitus, type 2): continue SSI   HTN (hypertension): continue antihypertensives.   Hyperlipemia   CAD (coronary artery disease) stable. Hold asa for now   Vitamin B 12 deficiency: see above  HPI/Subjective: No melena. No vomiting. Hungry.   Objective: Filed Vitals:   10/04/14 1304  BP: 144/65  Pulse: 62  Temp: 98.4 F (36.9 C)  Resp: 20    Intake/Output Summary (Last 24 hours) at 10/04/14 1357 Last data filed at 10/04/14 1300  Gross per 24 hour  Intake 1579.33 ml  Output    820 ml  Net 759.33 ml   Filed Weights   10/03/14 1554  Weight: 80.9 kg (178 lb 5.6 oz)    Exam:   General:  A and o. comfortable  Cardiovascular: RRR without MGR  Respiratory: CTA without WRR  Abdomen: s, nt, nd  Ext: no CCE  Basic Metabolic Panel:  Recent Labs Lab 10/03/14 1010  NA 137  K 4.1  CL 101  CO2 26  GLUCOSE 134*  BUN 10  CREATININE 0.83  CALCIUM 9.2   Liver Function Tests:  Recent Labs Lab 10/03/14 1010  AST 20  ALT 13  ALKPHOS 62  BILITOT 0.7  PROT 6.9  ALBUMIN 4.0   No results for input(s): LIPASE, AMYLASE in the last 168 hours. No results for input(s): AMMONIA in the last 168 hours. CBC:  Recent Labs Lab 10/03/14 1010 10/04/14 0245  WBC 5.8 5.1  NEUTROABS 3.9  --   HGB 5.5* 6.6*  HCT 21.5* 23.5*  MCV 59.4* 63.3*  PLT 352 269   Cardiac Enzymes: No results for input(s): CKTOTAL, CKMB, CKMBINDEX, TROPONINI in the last 168 hours. BNP (last 3 results) No results for  input(s): BNP in the last 8760 hours.  ProBNP (last 3 results) No results for input(s): PROBNP in the last 8760 hours.  CBG:  Recent Labs Lab 10/03/14 2007 10/04/14 0011 10/04/14 0358 10/04/14 0810 10/04/14 1148  GLUCAP 92 96 85 105* 91    No results found for this or any previous visit (from the past 240 hour(s)).   Studies: No results found.  Scheduled Meds: . aspirin EC  81 mg Oral Daily  . atorvastatin  40 mg Oral q1800  . finasteride  5 mg Oral Daily  . insulin aspart  0-9 Units Subcutaneous 6 times per day  . pantoprazole (PROTONIX) IV  40 mg Intravenous Q12H  . ramipril  10 mg Oral Daily  . tamsulosin  0.4 mg Oral QPC supper   Continuous Infusions: . sodium chloride 10 mL/hr at 10/04/14 1131  . sodium chloride 50 mL/hr at 10/04/14 0900  . sodium chloride    . lactated ringers      Time spent: 25 minutes  [facility] [email or web address], password [facility] 10/04/2014, 1:57 PM  LOS: 0 days

## 2014-10-04 NOTE — H&P (View-Only) (Signed)
Unassigned Consult  Reason for Consult: Symptomatic anemia and melena Referring Physician: [facility]  Sheppard Penton HPI: This is a 64 year old male with a PMH of IDA from a GI source, HTN, CAD, DM 2, and CAD who was identified to have an HGB of 5.5 g/dL.  The patient has a history of a GI bleed and he was evaluated by Dr. Laural Golden in [place].  It is reported that he had some antral scarring in the antrum and a capsule endoscopy revealed a jejunal AVM in 2010, but I do not have the actual reports.  For the past 2-3 weeks the patient he states having melena with every bowel movement.  He has an associated dizziness, but no reports of hematochezia or hematemesis.  He also denies any issues with chest pain, SOB, or MI.  The patient denies any bleeding issues over the past 6 years.  His HGB was normal at 14 g/dL last year.  He rarely takes NSAIDs and there is no family history of colon cancer.  He was just evaluated by Dr. Laural Golden on Tuesday and Dr. Laural Golden wanted to obtain a CBC, however, he wanted to wait until his PCP, Dr. Collene Mares checked his blood work.  It was at this time, yesterday, that his blood was drawn and revealed the severe anemia.  Past Medical History  Diagnosis Date  . Hematochezia 11/19/2009  . Iron deficiency anemia 11/19/2009  . 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 Hypertension   . Sleep apnea     study 20 yrs ago. no cpap used now  . CAD (coronary artery disease) 10/02/2010    nuclear study show normal perfusion on medical therapy without scar or ischemia. EF 64%  . Diabetes mellitus     type 2   . Hyperlipidemia   . Osteoarthritis     left knee    Past Surgical History  Procedure Laterality Date  . Colonoscopy  02/14/06    M JENKINS  . Ercp  12/25/1996    ROURK  . Upper gastrointestinal endoscopy  11/08/2008    EGD TCS  . Small bowel givens  11/25/2008  . Joint replacement  07-12-12    2008/2007 -knee replacements  . Circumcision  07/17/2012   Procedure: CIRCUMCISION ADULT;  Surgeon: Dutch Gray, MD;  Location: [facility];  Service: Urology;  Laterality: N/A;  . Cardiac catheterization  08/1999    which revealed a mild coronary artery disease with 60 and 70 and 80% stenosis in a small first diagonal vessel at the LAD, 50% mid LAD stenosis, 50% ostial left circumflrx stenosis, and 40% ostial and proximal intermediate stenosis. he had 10 to 20% irregularities of his mid right coronary artery.    Family History  Problem Relation Age of Onset  . Healthy Sister   . Healthy Brother   . Healthy Sister   . Healthy Brother   . Healthy Brother   . Healthy Daughter   . Healthy Daughter     Social History:  reports that he has been smoking Cigarettes.  He has never used smokeless tobacco. He reports that he does not drink alcohol or use illicit drugs.  Allergies:  Allergies  Allergen Reactions  . Morphine And Related Itching    Medications:  Scheduled: . pantoprazole (PROTONIX) IV  40 mg Intravenous Q12H   Continuous: . sodium chloride 10 mL/hr at 10/03/14 1209    Results for orders placed or performed during the hospital encounter of 10/03/14 (from the past 24 hour(s))  CBC with Differential/Platelet     Status: Abnormal   Collection Time: 10/03/14 10:10 AM  Result Value Ref Range   WBC 5.8 4.0 - 10.5 K/uL   RBC 3.62 (L) 4.22 - 5.81 MIL/uL   Hemoglobin 5.5 (LL) 13.0 - 17.0 g/dL   HCT 21.5 (L) 39.0 - 52.0 %   MCV 59.4 (L) 78.0 - 100.0 fL   MCH 15.2 (L) 26.0 - 34.0 pg   MCHC 25.6 (L) 30.0 - 36.0 g/dL   RDW 20.6 (H) 11.5 - 15.5 %   Platelets 352 150 - 400 K/uL   Neutrophils Relative % 68 43 - 77 %   Lymphocytes Relative 18 12 - 46 %   Monocytes Relative 12 3 - 12 %   Eosinophils Relative 1 0 - 5 %   Basophils Relative 1 0 - 1 %   Neutro Abs 3.9 1.7 - 7.7 K/uL   Lymphs Abs 1.0 0.7 - 4.0 K/uL   Monocytes Absolute 0.7 0.1 - 1.0 K/uL   Eosinophils Absolute 0.1 0.0 - 0.7 K/uL   Basophils Absolute 0.1 0.0 - 0.1 K/uL   RBC  Morphology ELLIPTOCYTES   Comprehensive metabolic panel     Status: Abnormal   Collection Time: 10/03/14 10:10 AM  Result Value Ref Range   Sodium 137 135 - 145 mmol/L   Potassium 4.1 3.5 - 5.1 mmol/L   Chloride 101 96 - 112 mmol/L   CO2 26 19 - 32 mmol/L   Glucose, Bld 134 (H) 70 - 99 mg/dL   BUN 10 6 - 23 mg/dL   Creatinine, Ser 0.83 0.50 - 1.35 mg/dL   Calcium 9.2 8.4 - 10.5 mg/dL   Total Protein 6.9 6.0 - 8.3 g/dL   Albumin 4.0 3.5 - 5.2 g/dL   AST 20 0 - 37 U/L   ALT 13 0 - 53 U/L   Alkaline Phosphatase 62 39 - 117 U/L   Total Bilirubin 0.7 0.3 - 1.2 mg/dL   GFR calc non Af Amer >90 >90 mL/min   GFR calc Af Amer >90 >90 mL/min   Anion gap 10 5 - 15  Type and screen     Status: None (Preliminary result)   Collection Time: 10/03/14 10:10 AM  Result Value Ref Range   ABO/RH(D) A POS    Antibody Screen NEG    Sample Expiration 10/06/2014    Unit Number [id number]    Blood Component Type RED CELLS,LR    Unit division 00    Status of Unit ISSUED    Transfusion Status OK TO TRANSFUSE    Crossmatch Result Compatible    Unit Number [id number]    Blood Component Type RED CELLS,LR    Unit division 00    Status of Unit ALLOCATED    Transfusion Status OK TO TRANSFUSE    Crossmatch Result Compatible   ABO/Rh     Status: None   Collection Time: 10/03/14 10:10 AM  Result Value Ref Range   ABO/RH(D) A POS   POC occult blood, ED RN will collect     Status: Abnormal   Collection Time: 10/03/14 10:36 AM  Result Value Ref Range   Fecal Occult Bld POSITIVE (A) NEGATIVE  Prepare RBC     Status: None   Collection Time: 10/03/14 11:08 AM  Result Value Ref Range   Order Confirmation ORDER PROCESSED BY BLOOD BANK      No results found.  ROS:  As stated above in the HPI otherwise negative.  Blood  pressure 127/69, pulse 70, temperature 98.6 F (37 C), temperature source Oral, resp. rate 16, SpO2 100 %.    PE: Gen: NAD, Alert and Oriented HEENT:  [place]/AT, EOMI Neck: Supple,  no LAD Lungs: CTA Bilaterally CV: RRR without M/G/R ABM: Soft, NTND, +BS Ext: No C/C/E  Assessment/Plan: 1) Iron Deficiency Anemia. 2) Melena. 3) Heme positive stool.   The patient will undergo an enteroscopy tomorrow.  If this is negative I will repeat his capsule endoscopy.  In cases such as this patient, a repeat capsule endoscopy has the highest chance of identifying a bleeding source.  Plan: 1) Enteroscopy +/- capsule endoscopy tomorrow. 2) agree with blood transfusion. Cyniah Gossard D 10/03/2014, 2:00 PM

## 2014-10-04 NOTE — Progress Notes (Signed)
Patient returned to room no complains family at bedside. BP 141/64 mmHg  Pulse 69  Temp(Src) 98.2 F (36.8 C) (Oral)  Resp 20  Ht 5\' 5"  (1.651 m)  Wt 80.9 kg (178 lb 5.6 oz)  BMI 29.68 kg/m2  SpO2 100%

## 2014-10-04 NOTE — Op Note (Signed)
[facility] [place], [id number]   ENTEROSCOPY PROCEDURE REPORT     EXAM DATE: 10/04/2014  PATIENT NAME:      Luke [name], Luke [name]           MR #:      [id number] BIRTHDATE:       1950-12-05      VISIT #:     [phone]  ATTENDING:     Carol Ada, MD     STATUS:     outpatient ASSISTANT:      Cleda Daub and Lindaann Slough MD: ASA CLASS:        Class III  INDICATIONS:  The patient is a 64 yr old male here for an enteroscopy procedure due to anemia. PROCEDURE PERFORMED:     Small bowel enteroscopy with ablation therapy  MEDICATIONS:     Monitored anesthesia care  CONSENT: The patient understands the risks and benefits of the procedure and understands that these risks include, but are not limited to: sedation, allergic reaction, infection, perforation and/or bleeding. Alternative means of evaluation and treatment include, among others: physical exam, x-rays, and/or surgical intervention. The patient elects to proceed with this endoscopic procedure.  DESCRIPTION OF PROCEDURE: During intra-op preparation period all mechanical & medical equipment was checked for proper function. Hand hygiene and appropriate measures for infection prevention was taken. After the risks, benefits and alternatives of the procedure were thoroughly explained, Informed consent was verified, confirmed and timeout was successfully executed by the treatment team. The    endoscope was introduced through the mouth and advanced to the proximal jejunum jejunum. The prep was The overall prep quality was excellent.. The instrument was then slowly withdrawn while examining the mucosa circumferentially. The scope was then completely withdrawn from the patient and the procedure terminated. The pulse, BP, and O2 saturation were monitored and documented by the physician and the nursing staff throughout the entire procedure.  The patient was cared for as planned  according to standard protocol, then discharged to recovery in stable condition and with appropriate post procedure care. Estimated blood loss is zero unless otherwise noted in this procedure report.    FINDINGS: The esophagus exhibited a 2-3 hiatal hernia.  The gastric lumen was normal.  In the proximal duodenum a lipoma was identified.  Deep intubation of into the proximal jejunum was achieved.  Two careful inspections were performed of the proximal small bowel.  Two small nonbleeding AVMs were identified and ablated with APC.  No evidence of any masses, polyps, inflammation, ulcerations, or erosions.    ADVERSE EVENTS:      There were no immediate complications.  IMPRESSIONS:     1) 2-3 cm hiatal hernia. 2) Duodenal lipoma. 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  These findings are not the source of the significant anemia.  RECOMMENDATIONS:     1) Capsule endoscopy. 2) Follow HGB and transfuse as necessary. 3) If the patient is stable over the weekend, he can be discharged home and follow up with Dr. Laural Golden, his primary GI ASAP.  RECALL:  _____________________________ Carol Ada, MD eSigned:  Carol Ada, MD 10/04/2014 10:42 AM   cc:     PATIENT NAME:  Luke [name] MR#: [id number]

## 2014-10-04 NOTE — Transfer of Care (Signed)
Immediate Anesthesia Transfer of Care Note  Patient: Luke [name]  Procedure(s) Performed: Procedure(s): ENTEROSCOPY (N/A)  Patient Location: PACU  Anesthesia Type:General  Level of Consciousness: awake, patient cooperative and responds to stimulation  Airway & Oxygen Therapy: Patient Spontanous Breathing and Patient connected to nasal cannula oxygen  Post-op Assessment: Report given to RN and Post -op Vital signs reviewed and stable  Post vital signs: Reviewed and stable  Last Vitals:  Filed Vitals:   10/04/14 0932  BP: 170/73  Pulse:   Temp: 36.7 C  Resp: 19    Complications: No apparent anesthesia complications

## 2014-10-04 NOTE — Anesthesia Preprocedure Evaluation (Addendum)
Anesthesia Evaluation  Patient identified by MRN, date of birth, ID band Patient awake    Reviewed: Allergy & Precautions, H&P , NPO status , Patient's Chart, lab work & pertinent test results  Airway Mallampati: II  TM Distance: >3 FB Neck ROM: full    Dental no notable dental hx.    Pulmonary sleep apnea , COPDCurrent Smoker,  Mild COPD breath sounds clear to auscultation  Pulmonary exam normal       Cardiovascular Exercise Tolerance: Good hypertension, Pt. on medications + CAD Rhythm:regular Rate:Normal     Neuro/Psych negative neurological ROS  negative psych ROS   GI/Hepatic Neg liver ROS, GERD-  Medicated and Controlled,  Endo/Other  diabetes, Well Controlled, Type 2, Oral Hypoglycemic Agents  Renal/GU negative Renal ROS     Musculoskeletal  (+) Arthritis -,   Abdominal   Peds  Hematology negative hematology ROS (+) anemia ,   Anesthesia Other Findings   Reproductive/Obstetrics negative OB ROS                             Anesthesia Physical  Anesthesia Plan  ASA: II  Anesthesia Plan: General   Post-op Pain Management:    Induction: Intravenous  Airway Management Planned: Oral ETT  Additional Equipment:   Intra-op Plan:   Post-operative Plan: Extubation in OR  Informed Consent: I have reviewed the patients History and Physical, chart, labs and discussed the procedure including the risks, benefits and alternatives for the proposed anesthesia with the patient or authorized representative who has indicated his/her understanding and acceptance.   Dental Advisory Given  Plan Discussed with: CRNA and Surgeon  Anesthesia Plan Comments:         Anesthesia Quick Evaluation

## 2014-10-04 NOTE — Progress Notes (Signed)
Nutrition Brief Note  Patient identified on the Malnutrition Screening Tool (MST) Report  Wt Readings from Last 15 Encounters:  10/03/14 178 lb 5.6 oz (80.9 kg)  10/01/14 176 lb 11.2 oz (80.151 kg)  11/22/13 185 lb 14.4 oz (84.324 kg)  09/25/13 182 lb (82.555 kg)  12/11/12 185 lb 8 oz (84.142 kg)  09/11/12 184 lb 8 oz (83.689 kg)  08/31/11 185 lb 14.4 oz (84.324 kg)  03/16/11 181 lb 14.4 oz (82.509 kg)   This is a 64 year old male with a PMH of IDA from a GI source, HTN, CAD, DM 2, and CAD who was identified to have an HGB of 5.5 g/dL. The patient has a history of a GI bleed and he was evaluated by Dr. Laural Golden in [place]. It is reported that he had some antral scarring in the antrum and a capsule endoscopy revealed a jejunal AVM in 2010.  Pt NPO and down for enteroscopy at time of visit. Chart review indicates a 6# intentional weight loss, per outpatient GI records. Pt with good appetite PTA.   Body mass index is 29.68 kg/(m^2). Patient meets criteria for overweight based on current BMI.   Current diet order is NPO, patient is consuming approximately n/a% of meals at this time. Labs and medications reviewed.   No nutrition interventions warranted at this time. If nutrition issues arise, please consult RD.   Mavrick Mcquigg A. Jimmye Norman, RD, LDN, CDE Pager: [phone] After hours Pager: [phone]

## 2014-10-05 DIAGNOSIS — K921 Melena: Principal | ICD-10-CM

## 2014-10-05 DIAGNOSIS — D649 Anemia, unspecified: Secondary | ICD-10-CM | POA: Insufficient documentation

## 2014-10-05 DIAGNOSIS — D509 Iron deficiency anemia, unspecified: Secondary | ICD-10-CM

## 2014-10-05 LAB — HEMOGLOBIN AND HEMATOCRIT, BLOOD
HCT: 30.8 % — ABNORMAL LOW (ref 39.0–52.0)
Hemoglobin: 9 g/dL — ABNORMAL LOW (ref 13.0–17.0)

## 2014-10-05 LAB — GLUCOSE, CAPILLARY
GLUCOSE-CAPILLARY: 198 mg/dL — AB (ref 70–99)
GLUCOSE-CAPILLARY: 136 mg/dL — AB (ref 70–99)
Glucose-Capillary: 144 mg/dL — ABNORMAL HIGH (ref 70–99)

## 2014-10-05 MED ORDER — VITAMIN B-12 1000 MCG PO TABS: 1000.0000 ug | ORAL_TABLET | Freq: Every day | ORAL | Status: DC

## 2014-10-05 MED ORDER — ASPIRIN 81 MG PO TABS: 81.0000 mg | ORAL_TABLET | Freq: Every day | ORAL | Status: AC

## 2014-10-05 NOTE — Discharge Summary (Signed)
Physician Discharge Summary  Luke [name] EXB:[id number] DOB: August 03, 1950 DOA: 10/03/2014  PCP: Collene Mares, PA-C  Admit date: 10/03/2014 Discharge date: 10/05/2014  Time spent: greater than 30 minutes  Recommendations for Outpatient Follow-up:  1. Monitor h/h 2. Monitor b12 level 3. outpt f/u with Dr. Laural Golden to consider colonoscopy  Discharge Diagnoses:  Principal Problem:   Acute anemia secondary to iron deficiency and B12 deficiency Active Problems:   GERD (gastroesophageal reflux disease)   IDA (iron deficiency anemia)   DM type 2 (diabetes mellitus, type 2)   HTN (hypertension)   Hyperlipemia   CAD (coronary artery disease)   Vitamin B 12 deficiency   Discharge Condition: stable  Diet recommendation: heart healthy carb modified  Filed Weights   10/03/14 1554  Weight: 80.9 kg (178 lb 5.6 oz)    History of present illness:  64 y/o male with CAD, HTN  to [facility] 4/14 after being seen  primary care physician apparently looked pale  and blood work was done Hemoglobin came back at 5.5 Patient states that over the past 2-3 weeks he's been feeling increasingly dizzy, cold and endorses increased preference for ice  He is had dark unformed/black stool for the past 2-3 weeks almost every time he is passed stool. + Dizziness and woozy headed feeling, + lightheadedness for the past 3 days  Prior GI history 11/08/2008 EGD/ED: Prepyloric/antral gastritis and a scar but no active bleeding noted. No other lesions noted in the upper GI tract. Normal terminal ileum.  Patient also underwent a Given Capsule. He had jejunal AVM. Marland Kitchen  Hospital Course:  Transfused 3 units pRBC. GI consulted. ASA held. Started on PPI   GI bleed-Occult: enteroscopy with 2 avm, not source of bleeding. Capsule endo preliminary report negative. Vitals stable. hgb improved. Symptoms improved. No melena. GI recommends discharge and f/u with pt's GI, Dr. Laural Golden to consider colonoscopy   IDA (iron  deficiency anemia): s/p transfusion 3 units prbc. Ferritin level 9. Will start iron back daily. Also noted to be B12 deficient.  Given IM cyanocobalamin injections. And rx for PO.  Will need outpt monitoring   CAD (coronary artery disease) stable. Hold asa for 1 week, then resume   Vitamin B 12 deficiency:  New problem. Level was 206.  see above    Procedures:  EGD enteroscopy  Small bowel capsule  Consultations:  GI, Hung  Discharge Exam: Filed Vitals:   10/05/14 0555  BP: 146/70  Pulse:   Temp: 98.4 F (36.9 C)  Resp: 20    General: a and o Cardiovascular: RRR Respiratory: CTA abd s, nt  Discharge Instructions   Discharge Instructions    Activity as tolerated - No restrictions    Complete by:  As directed      Diet - low sodium heart healthy    Complete by:  As directed      Diet Carb Modified    Complete by:  As directed      Discharge instructions    Complete by:  As directed   Hold aspirin for one week, then resume if no bleeding problems.  Stop etodolac. Resume iron tablet daily          Current Discharge Medication List    START taking these medications   Details  vitamin B-12 (CYANOCOBALAMIN) 1000 MCG tablet Take 1 tablet (1,000 mcg total) by mouth daily. Qty: 30 tablet, Refills: 1      CONTINUE these medications which have CHANGED   Details  aspirin 81 MG tablet Take 1 tablet (81 mg total) by mouth daily. HOLD FOR 1 WEEK, THEN RESUME IF NO BLEEDING PROBLEMS Qty: 30 tablet      CONTINUE these medications which have NOT CHANGED   Details  atorvastatin (LIPITOR) 40 MG tablet Take 1 tablet (40 mg total) by mouth daily. Qty: 90 tablet, Refills: 3    bisoprolol-hydrochlorothiazide (ZIAC) 2.5-6.25 MG per tablet Take 1 tablet by mouth daily.    Canagliflozin (INVOKANA) 300 MG TABS Take 300 mg by mouth every morning.    finasteride (PROSCAR) 5 MG tablet Take 5 mg by mouth daily.    metFORMIN (GLUMETZA) 1000 MG (MOD) 24 hr tablet Take 1,000  mg by mouth 2 (two) times daily.     omeprazole (PRILOSEC) 20 MG capsule Take 20 mg by mouth daily.    ramipril (ALTACE) 10 MG capsule Take 10 mg by mouth daily.    saxagliptin HCl (ONGLYZA) 5 MG TABS tablet Take 5 mg by mouth 2 (two) times daily.    tamsulosin (FLOMAX) 0.4 MG CAPS capsule Take 0.4 mg by mouth.      STOP taking these medications     etodolac (LODINE XL) 400 MG 24 hr tablet      pantoprazole (PROTONIX) 40 MG tablet        Allergies  Allergen Reactions  . Morphine And Related Itching   Follow-up Information    Follow up with MANN, BENJAMIN, PA-C In 4 weeks.   Specialties:  Physician Assistant, Internal Medicine   Why:  to check hemoglobin and vitamin B12   Contact information:   [address] [place] [address] [phone]       Follow up with REHMAN,NAJEEB U, MD In 2 weeks.   Specialty:  Gastroenterology   Why:  to consider colonoscopy   Contact information:   [place], [place] [address] [phone]        The results of significant diagnostics from this hospitalization (including imaging, microbiology, ancillary and laboratory) are listed below for reference.    Significant Diagnostic Studies: No results found.  Microbiology: No results found for this or any previous visit (from the past 240 hour(s)).   Labs: Basic Metabolic Panel:  Recent Labs Lab 10/03/14 1010  NA 137  K 4.1  CL 101  CO2 26  GLUCOSE 134*  BUN 10  CREATININE 0.83  CALCIUM 9.2   Liver Function Tests:  Recent Labs Lab 10/03/14 1010  AST 20  ALT 13  ALKPHOS 62  BILITOT 0.7  PROT 6.9  ALBUMIN 4.0   No results for input(s): LIPASE, AMYLASE in the last 168 hours. No results for input(s): AMMONIA in the last 168 hours. CBC:  Recent Labs Lab 10/03/14 1010 10/04/14 0245 10/04/14 1732 10/05/14 0829  WBC 5.8 5.1 6.5  --   NEUTROABS 3.9  --   --   --   HGB 5.5* 6.6* 8.6* 9.0*  HCT 21.5* 23.5* 28.8* 30.8*  MCV 59.4* 63.3*  67.8*  --   PLT 352 269 264  --    Cardiac Enzymes: No results for input(s): CKTOTAL, CKMB, CKMBINDEX, TROPONINI in the last 168 hours. BNP: BNP (last 3 results) No results for input(s): BNP in the last 8760 hours.  ProBNP (last 3 results) No results for input(s): PROBNP in the last 8760 hours.  CBG:  Recent Labs Lab 10/04/14 2017 10/04/14 2346 10/05/14 0402 10/05/14 0742 10/05/14 1158  GLUCAP 114* 162* 198* 136* 144*  SignedDelfina Redwood  [facility] 10/05/2014, 2:04 PM

## 2014-10-05 NOTE — Progress Notes (Signed)
Patient ID: Luke [name], male   DOB: 1951/03/19, 64 y.o.   MRN: [id number]    Progress Note Covering for Drs. Mann and [employer]   Subjective  Lots of questions, stool remains very dark He completed capsule endoscopy last night-awaiting results HGB 9-stable   Objective   Vital signs in last 24 hours: Temp:  [98 F (36.7 C)-98.9 F (37.2 C)] 98.4 F (36.9 C) (04/16 0555) Pulse Rate:  [62-71] 67 (04/15 2106) Resp:  [16-20] 20 (04/16 0555) BP: (132-155)/(59-76) 146/70 mmHg (04/16 0555) SpO2:  [95 %-99 %] 97 % (04/16 0555) Last BM Date: 10/02/14 General:  WD/WN NAD   Cardiac: Regular rate and rhythm; no murmurs Lungs: Respirations even and unlabored, lungs CTA bilaterally Abdomen:  Soft, nontender and nondistended. Normal bowel sounds. Extremities:  Without edema. Neurologic:  Alert and oriented,  grossly normal neurologically. Psych:  Cooperative. Normal mood and affect.  Intake/Output from previous day: 04/15 0701 - 04/16 0700 In: 2659.8 [P.O.:30; I.V.:1954.8; Blood:675] Out: 1895 [YBWLS:9373] Intake/Output this shift: Total I/O In: -  Out: 400 [Urine:400]  Lab Results:  Recent Labs  10/03/14 1010 10/04/14 0245 10/04/14 1732 10/05/14 0829  WBC 5.8 5.1 6.5  --   HGB 5.5* 6.6* 8.6* 9.0*  HCT 21.5* 23.5* 28.8* 30.8*  PLT 352 269 264  --    BMET  Recent Labs  10/03/14 1010  NA 137  K 4.1  CL 101  CO2 26  GLUCOSE 134*  BUN 10  CREATININE 0.83  CALCIUM 9.2   LFT  Recent Labs  10/03/14 1010  PROT 6.9  ALBUMIN 4.0  AST 20  ALT 13  ALKPHOS 62  BILITOT 0.7   PT/INR No results for input(s): LABPROT, INR in the last 72 hours.    Assessment / Plan:   #1 64 yo WM with fe deficiency anemia and low grade GI bleeding- 2 small non bleeding AVM's were identified on enteroscopy-capsule endoscopy completed ,results pending   Will review capsule endoscopy, as pt maybe able to be discharged if OK.   Addendum-Capsule endoscopy-preliminary review-shows 2 very  small AVM's in proximal small bowel-otherwise negative study Pt can be discharged to early follow up with Dr Lynnell Dike needs repeat colonoscopy. Dr Benson Norway to complete CE report.    Principal Problem:   GI bleed-Occult ? Active Problems:   GERD (gastroesophageal reflux disease)   IDA (iron deficiency anemia)   DM type 2 (diabetes mellitus, type 2)   HTN (hypertension)   Hyperlipemia   CAD (coronary artery disease)   Vitamin B 12 deficiency    LOS: 1 day   Amy Esterwood  10/05/2014, 11:57 AM     Attending physician's note   I have taken an interval history, reviewed the chart and examined the patient. I agree with the Advanced Practitioner's note, impression and recommendations. SB AVMs. Consider repeat colonoscopy as outpatient, defer decision to Dr. Laural Golden. Formal/complete CE report to follow by Dr. Benson Norway. Outpatient follow up with Dr. Laural Golden, [place] for discharge from GI standpoint.   Pricilla Riffle. Fuller Plan, MD [facility]

## 2014-10-07 ENCOUNTER — Encounter (HOSPITAL_COMMUNITY): Payer: Self-pay | Admitting: Gastroenterology

## 2014-10-07 LAB — TYPE AND SCREEN
ABO/RH(D): A POS
ANTIBODY SCREEN: NEGATIVE
UNIT DIVISION: 0
UNIT DIVISION: 0
UNIT DIVISION: 0
UNIT DIVISION: 0
Unit division: 0
Unit division: 0

## 2014-10-08 ENCOUNTER — Telehealth (INDEPENDENT_AMBULATORY_CARE_PROVIDER_SITE_OTHER): Payer: Self-pay | Admitting: Internal Medicine

## 2014-10-08 DIAGNOSIS — D531 Other megaloblastic anemias, not elsewhere classified: Secondary | ICD-10-CM | POA: Diagnosis not present

## 2014-10-08 DIAGNOSIS — E119 Type 2 diabetes mellitus without complications: Secondary | ICD-10-CM | POA: Diagnosis not present

## 2014-10-08 DIAGNOSIS — Z6828 Body mass index (BMI) 28.0-28.9, adult: Secondary | ICD-10-CM | POA: Diagnosis not present

## 2014-10-08 NOTE — Anesthesia Postprocedure Evaluation (Signed)
Anesthesia Post Note  Patient: Luke [name]  Procedure(s) Performed: Procedure(s) (LRB): ENTEROSCOPY (N/A)  Anesthesia type: General  Patient location: PACU  Post pain: Pain level controlled  Post assessment: Post-op Vital signs reviewed  Last Vitals: BP 146/70 mmHg  Pulse 67  Temp(Src) 36.9 C (Oral)  Resp 20  Ht 5\' 5"  (1.651 m)  Wt 178 lb 5.6 oz (80.9 kg)  BMI 29.68 kg/m2  SpO2 97%  Post vital signs: Reviewed  Level of consciousness: sedated  Complications: No apparent anesthesia complications

## 2014-10-08 NOTE — Telephone Encounter (Signed)
Luke [name], patient needs a colonoscopy. Melena. Anemia.

## 2014-10-09 ENCOUNTER — Telehealth (INDEPENDENT_AMBULATORY_CARE_PROVIDER_SITE_OTHER): Payer: Self-pay | Admitting: *Deleted

## 2014-10-09 ENCOUNTER — Other Ambulatory Visit (INDEPENDENT_AMBULATORY_CARE_PROVIDER_SITE_OTHER): Payer: Self-pay | Admitting: *Deleted

## 2014-10-09 ENCOUNTER — Ambulatory Visit (INDEPENDENT_AMBULATORY_CARE_PROVIDER_SITE_OTHER): Payer: No Typology Code available for payment source | Admitting: Internal Medicine

## 2014-10-09 DIAGNOSIS — K921 Melena: Secondary | ICD-10-CM

## 2014-10-09 DIAGNOSIS — D649 Anemia, unspecified: Secondary | ICD-10-CM

## 2014-10-09 MED ORDER — PEG 3350-KCL-NA BICARB-NACL 420 G PO SOLR: 4000.0000 mL | Freq: Once | ORAL | Status: DC

## 2014-10-09 NOTE — Telephone Encounter (Signed)
TCS sch'd 10/28/14, patient aware

## 2014-10-09 NOTE — Telephone Encounter (Signed)
Patient needs trilyte 

## 2014-10-10 DIAGNOSIS — D225 Melanocytic nevi of trunk: Secondary | ICD-10-CM | POA: Diagnosis not present

## 2014-10-10 DIAGNOSIS — D2271 Melanocytic nevi of right lower limb, including hip: Secondary | ICD-10-CM | POA: Diagnosis not present

## 2014-10-10 DIAGNOSIS — L711 Rhinophyma: Secondary | ICD-10-CM | POA: Diagnosis not present

## 2014-10-10 DIAGNOSIS — D1801 Hemangioma of skin and subcutaneous tissue: Secondary | ICD-10-CM | POA: Diagnosis not present

## 2014-10-10 DIAGNOSIS — L718 Other rosacea: Secondary | ICD-10-CM | POA: Diagnosis not present

## 2014-10-10 DIAGNOSIS — L400 Psoriasis vulgaris: Secondary | ICD-10-CM | POA: Diagnosis not present

## 2014-10-10 DIAGNOSIS — L570 Actinic keratosis: Secondary | ICD-10-CM | POA: Diagnosis not present

## 2014-10-10 DIAGNOSIS — L821 Other seborrheic keratosis: Secondary | ICD-10-CM | POA: Diagnosis not present

## 2014-10-11 DIAGNOSIS — M5136 Other intervertebral disc degeneration, lumbar region: Secondary | ICD-10-CM | POA: Diagnosis not present

## 2014-10-11 DIAGNOSIS — M5030 Other cervical disc degeneration, unspecified cervical region: Secondary | ICD-10-CM | POA: Diagnosis not present

## 2014-10-11 DIAGNOSIS — M9901 Segmental and somatic dysfunction of cervical region: Secondary | ICD-10-CM | POA: Diagnosis not present

## 2014-10-11 DIAGNOSIS — M9903 Segmental and somatic dysfunction of lumbar region: Secondary | ICD-10-CM | POA: Diagnosis not present

## 2014-10-16 DIAGNOSIS — M5441 Lumbago with sciatica, right side: Secondary | ICD-10-CM | POA: Diagnosis not present

## 2014-10-23 DIAGNOSIS — R195 Other fecal abnormalities: Secondary | ICD-10-CM | POA: Diagnosis not present

## 2014-10-23 DIAGNOSIS — K5520 Angiodysplasia of colon without hemorrhage: Secondary | ICD-10-CM | POA: Diagnosis not present

## 2014-10-23 DIAGNOSIS — D500 Iron deficiency anemia secondary to blood loss (chronic): Secondary | ICD-10-CM | POA: Diagnosis not present

## 2014-10-28 ENCOUNTER — Encounter (HOSPITAL_COMMUNITY): Payer: Self-pay

## 2014-10-28 ENCOUNTER — Ambulatory Visit (HOSPITAL_COMMUNITY)
Admission: RE | Admit: 2014-10-28 | Discharge: 2014-10-28 | Disposition: A | Discharge status: Home / Self Care | Payer: Medicare Other | Source: Ambulatory Visit | Attending: Internal Medicine | Admitting: Internal Medicine

## 2014-10-28 ENCOUNTER — Encounter (HOSPITAL_COMMUNITY)
Admission: RE | Disposition: A | Discharge status: Home / Self Care | Payer: Self-pay | Source: Ambulatory Visit | Attending: Internal Medicine

## 2014-10-28 DIAGNOSIS — K219 Gastro-esophageal reflux disease without esophagitis: Secondary | ICD-10-CM | POA: Insufficient documentation

## 2014-10-28 DIAGNOSIS — E785 Hyperlipidemia, unspecified: Secondary | ICD-10-CM | POA: Diagnosis not present

## 2014-10-28 DIAGNOSIS — I10 Essential (primary) hypertension: Secondary | ICD-10-CM | POA: Insufficient documentation

## 2014-10-28 DIAGNOSIS — E119 Type 2 diabetes mellitus without complications: Secondary | ICD-10-CM | POA: Insufficient documentation

## 2014-10-28 DIAGNOSIS — K861 Other chronic pancreatitis: Secondary | ICD-10-CM | POA: Insufficient documentation

## 2014-10-28 DIAGNOSIS — D649 Anemia, unspecified: Secondary | ICD-10-CM

## 2014-10-28 DIAGNOSIS — Z7982 Long term (current) use of aspirin: Secondary | ICD-10-CM | POA: Insufficient documentation

## 2014-10-28 DIAGNOSIS — Z9861 Coronary angioplasty status: Secondary | ICD-10-CM | POA: Diagnosis not present

## 2014-10-28 DIAGNOSIS — M1712 Unilateral primary osteoarthritis, left knee: Secondary | ICD-10-CM | POA: Diagnosis not present

## 2014-10-28 DIAGNOSIS — K921 Melena: Secondary | ICD-10-CM | POA: Diagnosis not present

## 2014-10-28 DIAGNOSIS — I2510 Atherosclerotic heart disease of native coronary artery without angina pectoris: Secondary | ICD-10-CM | POA: Diagnosis not present

## 2014-10-28 DIAGNOSIS — Z886 Allergy status to analgesic agent status: Secondary | ICD-10-CM | POA: Insufficient documentation

## 2014-10-28 DIAGNOSIS — D509 Iron deficiency anemia, unspecified: Secondary | ICD-10-CM | POA: Diagnosis not present

## 2014-10-28 DIAGNOSIS — G4730 Sleep apnea, unspecified: Secondary | ICD-10-CM | POA: Diagnosis not present

## 2014-10-28 DIAGNOSIS — Z87891 Personal history of nicotine dependence: Secondary | ICD-10-CM | POA: Diagnosis not present

## 2014-10-28 DIAGNOSIS — Z96652 Presence of left artificial knee joint: Secondary | ICD-10-CM | POA: Insufficient documentation

## 2014-10-28 DIAGNOSIS — D127 Benign neoplasm of rectosigmoid junction: Secondary | ICD-10-CM | POA: Diagnosis not present

## 2014-10-28 DIAGNOSIS — K6389 Other specified diseases of intestine: Secondary | ICD-10-CM | POA: Diagnosis not present

## 2014-10-28 HISTORY — DX: Cardiac murmur, unspecified: R01.1

## 2014-10-28 HISTORY — DX: Gastro-esophageal reflux disease without esophagitis: K21.9

## 2014-10-28 HISTORY — PX: COLONOSCOPY: SHX5424

## 2014-10-28 LAB — GLUCOSE, CAPILLARY: Glucose-Capillary: 144 mg/dL — ABNORMAL HIGH (ref 70–99)

## 2014-10-28 LAB — HEMOGLOBIN AND HEMATOCRIT, BLOOD
HCT: 37.6 % — ABNORMAL LOW (ref 39.0–52.0)
Hemoglobin: 10.7 g/dL — ABNORMAL LOW (ref 13.0–17.0)

## 2014-10-28 SURGERY — COLONOSCOPY
Anesthesia: Moderate Sedation

## 2014-10-28 MED ORDER — FERROUS SULFATE 325 (65 FE) MG PO TBEC: 325.0000 mg | DELAYED_RELEASE_TABLET | Freq: Two times a day (BID) | ORAL | Status: DC

## 2014-10-28 MED ORDER — MIDAZOLAM HCL 5 MG/5ML IJ SOLN
INTRAMUSCULAR | Status: AC
  Filled 2014-10-28: qty 10

## 2014-10-28 MED ORDER — MIDAZOLAM HCL 5 MG/5ML IJ SOLN
INTRAMUSCULAR | Status: DC | PRN
  Administered 2014-10-28 (×2): 2 mg via INTRAVENOUS
  Administered 2014-10-28: 1 mg via INTRAVENOUS

## 2014-10-28 MED ORDER — MEPERIDINE HCL 50 MG/ML IJ SOLN
INTRAMUSCULAR | Status: DC | PRN
  Administered 2014-10-28 (×2): 25 mg via INTRAVENOUS

## 2014-10-28 MED ORDER — STERILE WATER FOR IRRIGATION IR SOLN
Status: DC | PRN
  Administered 2014-10-28: 07:00:00

## 2014-10-28 MED ORDER — SODIUM CHLORIDE 0.9 % IV SOLN
INTRAVENOUS | Status: DC
  Administered 2014-10-28: 07:00:00 via INTRAVENOUS

## 2014-10-28 MED ORDER — FERROUS SULFATE 325 (65 FE) MG PO TBEC: 325.0000 mg | DELAYED_RELEASE_TABLET | Freq: Every day | ORAL | Status: DC

## 2014-10-28 MED ORDER — MEPERIDINE HCL 50 MG/ML IJ SOLN
INTRAMUSCULAR | Status: AC
  Filled 2014-10-28: qty 1

## 2014-10-28 NOTE — Discharge Instructions (Signed)
Resume usual medications and diet. Ferrous sulfate 325 mg(OTC) 1 tablet by mouth after breakfast and evening meal daily. No driving for 24 hours. Physician will call with results of blood tests. Office visit in 3 months. Colonoscopy, Care After  Refer to this sheet in the next few weeks. These instructions provide you with information on caring for yourself after your procedure. Your health care provider may also give you more specific instructions. Your treatment has been planned according to current medical practices, but problems sometimes occur. Call your health care provider if you have any problems or questions after your procedure. WHAT TO EXPECT AFTER THE PROCEDURE  After your procedure, it is typical to have the following:  A small amount of blood in your stool.  Moderate amounts of gas and mild abdominal cramping or bloating. HOME CARE INSTRUCTIONS  Do not drive, operate machinery, or sign important documents for 24 hours.  You may shower and resume your regular physical activities, but move at a slower pace for the first 24 hours.  Take frequent rest periods for the first 24 hours.  Drink enough fluids to keep your urine clear or pale yellow.  You may resume your normal diet as instructed by your health care provider. Avoid heavy or fried foods that are hard to digest.  Avoid drinking alcohol for 24 hours or as instructed by your health care provider.  Only take over-the-counter or prescription medicines as directed by your health care provider. SEEK MEDICAL CARE IF: You have persistent spotting of blood in your stool 2-3 days after the procedure. SEEK IMMEDIATE MEDICAL CARE IF:  You have more than a small spotting of blood in your stool.  You pass large blood clots in your stool.  Your abdomen is swollen (distended).  You have nausea or vomiting.  You have a fever.  You have increasing abdominal pain that is not relieved with medicine. Document Released:  01/20/2004 Document Revised: 03/28/2013 Document Reviewed: 02/12/2013 [facility] Patient Information 2015 [place], [place]. This information is not intended to replace advice given to you by your health care provider. Make sure you discuss any questions you have with your health care provider.

## 2014-10-28 NOTE — Op Note (Signed)
COLONOSCOPY PROCEDURE REPORT  PATIENT:  Luke [name]  MR#:  [id number] Birthdate:  Sep 12, 1950, 64 y.o., male Endoscopist:  Dr. Rogene Houston, MD Referred By:  Collene Mares, PA-C  Procedure Date: 10/28/2014  Procedure:   Colonoscopy  Indications:  Patient is 64 year old Caucasian male with history of iron deficiency anemia with workup in 2010. He was seen by me in April 2015 and his hemoglobin was 14 g. Stool was guaiac negative. By mouth iron was discontinued. He now presents with few weeks history of melena and noted to have hemoglobin of 5.5 g. He was [facility] to [facility] and underwent enteroscopy with APC ablation of to jejunal AV malformations and given capsule study by Dr. Carol Ada. These lesions are felt not to be the source of bleeding. He was advised outpatient colonoscopy.  Informed Consent:  The procedure and risks were reviewed with the patient and informed consent was obtained.  Medications:  Demerol 50 mg IV Versed 5 mg IV  Description of procedure:  After a digital rectal exam was performed, that colonoscope was advanced from the anus through the rectum and colon to the area of the cecum, ileocecal valve and appendiceal orifice. The cecum was deeply intubated. These structures were well-seen and photographed for the record. From the level of the cecum and ileocecal valve, the scope was slowly and cautiously withdrawn. The mucosal surfaces were carefully surveyed utilizing scope tip to flexion to facilitate fold flattening as needed. The scope was pulled down into the rectum where a thorough exam including retroflexion was performed. Terminal ileum was also examined.  Findings:   Prep excellent. Normal mucosa of terminal ileum. Focal edema/thickening to fold at mid sigmoid colon. Biopsy taken for histology. Mucosa of rest of the colon and rectum was normal. Unremarkable anorectal junction.    Therapeutic/Diagnostic Maneuvers Performed:  See above  Complications:   None  Cecal Withdrawal Time:  18 minutes  Impression:  Normal mucosa of terminal ileum. Focal edema/thickening to mucosal fold at sigmoid colon. Biopsy taken. No lesions noted to account for patient's melena and iron deficiency anemia.  Recommendations:  Standard instructions given. Will check H&H today. Patient will go back on ferrous sulfate 325 mg by mouth twice a day. I will contact patient with biopsy results and further recommendations. Patient will call office if melena recurs. Office visit in 3 months.  Sinclair Arrazola U  10/28/2014 8:18 AM  CC: Dr. Collene Mares, Marland Kitchen, PA-C & Dr. Rayne Du ref. provider found

## 2014-10-28 NOTE — H&P (Signed)
Luke [name] is an 64 y.o. male.   Chief Complaint: Patient is here for colonoscopy. HPI: Patient is 64 year old Caucasian male with history of iron deficiency anemia with workup back in 2010. He recently developed multiple episodes of melena and was found to have hemoglobin of 5.5 g. He was admitted to [facility] and received 4 units of PRBCs. He underwent enteroscopy 200 cm with APC ablation of 2 small jejunal AV malformations but these are not bleeding. He subsequently had given capsule study and no bleeding lesion was identified. Patient was discharged and advised to have colonoscopy. He states melena has resolved. He denies taking OTC NSAIDs other than low-dose aspirin. He states he had lost appetite for a few weeks and lost 11 pounds in the skin 5 back. He states she was also having left leg pain. This pain has resolved since transfusion. He has an appointment to see Dr. Ricki Rodriguez.  Past Medical History  Diagnosis Date  . Hematochezia 11/19/2009  . Iron deficiency anemia 11/19/2009  . 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 Hypertension   . Sleep apnea     study 20 yrs ago. no cpap used now  . CAD (coronary artery disease) 10/02/2010    nuclear study show normal perfusion on medical therapy without scar or ischemia. EF 64%  . Diabetes mellitus     type 2   . Hyperlipidemia   . Osteoarthritis     left knee  . Heart murmur   . GERD (gastroesophageal reflux disease)     Past Surgical History  Procedure Laterality Date  . Colonoscopy  02/14/06    M JENKINS  . Ercp  12/25/1996    ROURK  . Upper gastrointestinal endoscopy  11/08/2008    EGD TCS  . Small bowel givens  11/25/2008  . Joint replacement  07-12-12    2008/2007 -knee replacements  . Circumcision  07/17/2012    Procedure: CIRCUMCISION ADULT;  Surgeon: Dutch Gray, MD;  Location: [facility];  Service: Urology;  Laterality: N/A;  . Cardiac catheterization  08/1999    which revealed a mild coronary artery disease with 60 and 70 and 80%  stenosis in a small first diagonal vessel at the LAD, 50% mid LAD stenosis, 50% ostial left circumflrx stenosis, and 40% ostial and proximal intermediate stenosis. he had 10 to 20% irregularities of his mid right coronary artery.  . Enteroscopy N/A 10/04/2014    Procedure: ENTEROSCOPY;  Surgeon: Carol Ada, MD;  Location: [place];  Service: Endoscopy;  Laterality: N/A;  . Givens capsule study N/A 10/04/2014    Procedure: GIVENS CAPSULE STUDY;  Surgeon: Carol Ada, MD;  Location: [place];  Service: Endoscopy;  Laterality: N/A;    Family History  Problem Relation Age of Onset  . Healthy Sister   . Healthy Brother   . Healthy Sister   . Healthy Brother   . Healthy Brother   . Healthy Daughter   . Healthy Daughter    Social History:  reports that he quit smoking about 4 weeks ago. His smoking use included Cigarettes. He has a 50 pack-year smoking history. He has never used smokeless tobacco. He reports that he does not drink alcohol or use illicit drugs.  Allergies:  Allergies  Allergen Reactions  . Morphine And Related Itching    Medications Prior to Admission  Medication Sig Dispense Refill  . aspirin 81 MG tablet Take 1 tablet (81 mg total) by mouth daily. HOLD FOR 1 WEEK, THEN RESUME IF NO BLEEDING PROBLEMS 30 tablet   .  atorvastatin (LIPITOR) 40 MG tablet Take 1 tablet (40 mg total) by mouth daily. 90 tablet 3  . bisoprolol-hydrochlorothiazide (ZIAC) 2.5-6.25 MG per tablet Take 1 tablet by mouth daily.    . Canagliflozin (INVOKANA) 300 MG TABS Take 300 mg by mouth every morning.    . finasteride (PROSCAR) 5 MG tablet Take 5 mg by mouth daily.    . metFORMIN (GLUMETZA) 1000 MG (MOD) 24 hr tablet Take 1,000 mg by mouth 2 (two) times daily.     . pantoprazole (PROTONIX) 40 MG tablet Take 40 mg by mouth daily.   3  . polyethylene glycol-electrolytes (NULYTELY/GOLYTELY) 420 G solution Take 4,000 mLs by mouth once. 4000 mL 0  . ramipril (ALTACE) 10 MG capsule Take 10 mg by  mouth daily.    . saxagliptin HCl (ONGLYZA) 5 MG TABS tablet Take 5 mg by mouth 2 (two) times daily.    . tamsulosin (FLOMAX) 0.4 MG CAPS capsule Take 0.4 mg by mouth.    . vitamin B-12 (CYANOCOBALAMIN) 1000 MCG tablet Take 1 tablet (1,000 mcg total) by mouth daily. 30 tablet 1    Results for orders placed or performed during the hospital encounter of 10/28/14 (from the past 48 hour(s))  Glucose, capillary     Status: Abnormal   Collection Time: 10/28/14  7:03 AM  Result Value Ref Range   Glucose-Capillary 144 (H) 70 - 99 mg/dL   No results found.  ROS  Blood pressure 167/78, pulse 60, temperature 97.9 F (36.6 C), temperature source Oral, resp. rate 20, height 5\' 5"  (1.651 m), weight 178 lb (80.74 kg), SpO2 95 %. Physical Exam  Constitutional: He appears well-developed and well-nourished.  HENT:  Mouth/Throat: Oropharynx is clear and moist.  Eyes: Conjunctivae are normal. No scleral icterus.  Neck: No thyromegaly present.  Cardiovascular: Normal rate, regular rhythm and normal heart sounds.   No murmur heard. Respiratory: Effort normal and breath sounds normal.  GI: Soft. He exhibits no distension and no mass. There is no tenderness.  Musculoskeletal: He exhibits no edema.  Lymphadenopathy:    He has no cervical adenopathy.  Neurological: He is alert.  Skin: Skin is warm.     Assessment/Plan History of melena and iron deficiency anemia. Bleeding lesion not identified on enteroscopy and given capsule study. 2 small jejunal AV malformations were ablated on enteroscopy but felt not to be the source of bleeding. Diagnostic colonoscopy.  Brandalyn Harting U 10/28/2014, 7:38 AM

## 2014-10-29 ENCOUNTER — Encounter (HOSPITAL_COMMUNITY): Payer: Self-pay | Admitting: Internal Medicine

## 2014-10-30 ENCOUNTER — Telehealth (INDEPENDENT_AMBULATORY_CARE_PROVIDER_SITE_OTHER): Payer: Self-pay | Admitting: *Deleted

## 2014-10-30 DIAGNOSIS — D509 Iron deficiency anemia, unspecified: Secondary | ICD-10-CM

## 2014-10-30 NOTE — Telephone Encounter (Signed)
Per Dr.Rehman the patient will need to have labs drawn in 6 weeks, noted for 12-11-14.

## 2014-10-31 ENCOUNTER — Telehealth: Payer: Self-pay | Admitting: *Deleted

## 2014-10-31 NOTE — Telephone Encounter (Signed)
Dr.Kelly,Mr.Jeovany Huitron is a patient of your who would like to transfer his care to Dr.McAlhany,his wife is a patient of Dr.McAlhany,if this is agreed with the both of you.Thank you.

## 2014-10-31 NOTE — Telephone Encounter (Signed)
I do not typically accept transfers within our group. He has a good doctor and I would recommend that he continue to follow with Dr. Claiborne Billings.   Darlina Guys

## 2014-11-01 ENCOUNTER — Encounter (INDEPENDENT_AMBULATORY_CARE_PROVIDER_SITE_OTHER): Payer: Self-pay | Admitting: *Deleted

## 2014-11-04 DIAGNOSIS — Z6828 Body mass index (BMI) 28.0-28.9, adult: Secondary | ICD-10-CM | POA: Diagnosis not present

## 2014-11-04 DIAGNOSIS — D531 Other megaloblastic anemias, not elsewhere classified: Secondary | ICD-10-CM | POA: Diagnosis not present

## 2014-11-05 DIAGNOSIS — E1165 Type 2 diabetes mellitus with hyperglycemia: Secondary | ICD-10-CM | POA: Diagnosis not present

## 2014-11-13 ENCOUNTER — Other Ambulatory Visit (INDEPENDENT_AMBULATORY_CARE_PROVIDER_SITE_OTHER): Payer: Self-pay | Admitting: *Deleted

## 2014-11-13 ENCOUNTER — Encounter (INDEPENDENT_AMBULATORY_CARE_PROVIDER_SITE_OTHER): Payer: Self-pay | Admitting: *Deleted

## 2014-11-13 DIAGNOSIS — D509 Iron deficiency anemia, unspecified: Secondary | ICD-10-CM

## 2014-11-29 DIAGNOSIS — Z96652 Presence of left artificial knee joint: Secondary | ICD-10-CM | POA: Diagnosis not present

## 2014-11-29 DIAGNOSIS — M25552 Pain in left hip: Secondary | ICD-10-CM | POA: Diagnosis not present

## 2014-11-29 DIAGNOSIS — Z96651 Presence of right artificial knee joint: Secondary | ICD-10-CM | POA: Diagnosis not present

## 2014-11-29 DIAGNOSIS — Z471 Aftercare following joint replacement surgery: Secondary | ICD-10-CM | POA: Diagnosis not present

## 2014-12-10 DIAGNOSIS — D509 Iron deficiency anemia, unspecified: Secondary | ICD-10-CM | POA: Diagnosis not present

## 2014-12-19 DIAGNOSIS — Z6828 Body mass index (BMI) 28.0-28.9, adult: Secondary | ICD-10-CM | POA: Diagnosis not present

## 2014-12-19 DIAGNOSIS — E119 Type 2 diabetes mellitus without complications: Secondary | ICD-10-CM | POA: Diagnosis not present

## 2014-12-19 DIAGNOSIS — E663 Overweight: Secondary | ICD-10-CM | POA: Diagnosis not present

## 2014-12-19 DIAGNOSIS — I10 Essential (primary) hypertension: Secondary | ICD-10-CM | POA: Diagnosis not present

## 2014-12-19 DIAGNOSIS — Z1389 Encounter for screening for other disorder: Secondary | ICD-10-CM | POA: Diagnosis not present

## 2015-01-01 ENCOUNTER — Ambulatory Visit (INDEPENDENT_AMBULATORY_CARE_PROVIDER_SITE_OTHER): Payer: Medicare Other | Admitting: Cardiovascular Disease

## 2015-01-01 ENCOUNTER — Encounter: Payer: Self-pay | Admitting: Cardiovascular Disease

## 2015-01-01 VITALS — BP 140/70 | HR 55 | Ht 65.0 in | Wt 174.4 lb

## 2015-01-01 DIAGNOSIS — K922 Gastrointestinal hemorrhage, unspecified: Secondary | ICD-10-CM

## 2015-01-01 DIAGNOSIS — D509 Iron deficiency anemia, unspecified: Secondary | ICD-10-CM | POA: Diagnosis not present

## 2015-01-01 DIAGNOSIS — E785 Hyperlipidemia, unspecified: Secondary | ICD-10-CM | POA: Diagnosis not present

## 2015-01-01 DIAGNOSIS — I2583 Coronary atherosclerosis due to lipid rich plaque: Secondary | ICD-10-CM

## 2015-01-01 DIAGNOSIS — I2510 Atherosclerotic heart disease of native coronary artery without angina pectoris: Secondary | ICD-10-CM | POA: Diagnosis not present

## 2015-01-01 DIAGNOSIS — T39395A Adverse effect of other nonsteroidal anti-inflammatory drugs [NSAID], initial encounter: Secondary | ICD-10-CM

## 2015-01-01 NOTE — Patient Instructions (Signed)
Your physician wants you to follow-up in: 1 year or sooner if needed. You will receive a reminder letter in the mail two months in advance. If you don't receive a letter, please call our office to schedule the follow-up appointment.  

## 2015-01-01 NOTE — Progress Notes (Signed)
Patient ID: Luke [name], male   DOB: 11-07-1950, 64 y.o.   MRN: [id number]      HPI: Luke [name] is a 64 y.o. male who presents to the office for a one-year followup evaluation.   Luke [name] has documented mild CAD by cardiac catheterization in 2001 which revealed segmental 60-80% stenoses and a small first diagonal vessel of the LAD, 50% mid LAD stenosis, 50% ostial left circumflex stenosis, and 30% ostial and proximal intermediate stenosis. Mild luminal regularity his of his right coronary artery. He has been on aggressive medical therapy since that time and has remained stable.  Additional problems include mixed hyperlipidemia requiring combination therapy, hypertension, and type 2 diabetes mellitus.  Over the past year, he has continued to do well with reference to chest pain or anginal symptoms.  He has been on Caduet 5/40, tramadol, 10 mg in addition to Ziac 2.5/6.25 mg for blood pressure control.  He now is on multiple drug therapy for his diabetes including invokana, Levemir, metformin, as well as onglyza.   since I last saw him, he was found to develop a profound anemia with a hemoglobin dropped to 5.3 and hematocrit 20.6 in April 2016. MCV was 58.  He was hospitalized and received 4 units of blood transfusion apparently underwent GI evaluation with Dr. Sandi Mealy as well as with Dr. Jearld Adjutant.  He was felt to have acute anemia secondary to iron deficiency and B12 deficiency.  No definitive source of bleeding was demonstrated on his GI studies.  Apparently there was some question of possibly referring him to [facility] for small intestine evaluation  Despite his  profound anemia he did not experience any chest pain , PND or orthopnea.  He had subsequent blood work obtained in [place] hemoglobin to 11.7 and hematocrit 37.3.  MCV had risen to 69.  He continues to be on iron replacement as well as B12 replacement therapy.  With reference to blood pressure control, he is on ramipril 10 mg, bisoprolol  HCTZ 2.5/6.25 mg,.  He is on Lipitor 40 mg for hyperlipidemia.  He is on a 3 drug regimen for his diabetes including Onglyza, metformin, and invokana.  He presents for yearly cardiac evaluation.   Past Medical History  Diagnosis Date  . Hematochezia 11/19/2009  . Iron deficiency anemia 11/19/2009  . 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 Hypertension   . Sleep apnea     study 20 yrs ago. no cpap used now  . CAD (coronary artery disease) 10/02/2010    nuclear study show normal perfusion on medical therapy without scar or ischemia. EF 64%  . Diabetes mellitus     type 2   . Hyperlipidemia   . Osteoarthritis     left knee  . Heart murmur   . GERD (gastroesophageal reflux disease)     Past Surgical History  Procedure Laterality Date  . Colonoscopy  02/14/06    M JENKINS  . Ercp  12/25/1996    ROURK  . Upper gastrointestinal endoscopy  11/08/2008    EGD TCS  . Small bowel givens  11/25/2008  . Joint replacement  07-12-12    2008/2007 -knee replacements  . Circumcision  07/17/2012    Procedure: CIRCUMCISION ADULT;  Surgeon: Dutch Gray, MD;  Location: [facility];  Service: Urology;  Laterality: N/A;  . Cardiac catheterization  08/1999    which revealed a mild coronary artery disease with 60 and 70 and 80% stenosis in a small first diagonal vessel at the LAD, 50% mid  LAD stenosis, 50% ostial left circumflrx stenosis, and 40% ostial and proximal intermediate stenosis. he had 10 to 20% irregularities of his mid right coronary artery.  . Enteroscopy N/A 10/04/2014    Procedure: ENTEROSCOPY;  Surgeon: Carol Ada, MD;  Location: [place];  Service: Endoscopy;  Laterality: N/A;  . Givens capsule study N/A 10/04/2014    Procedure: GIVENS CAPSULE STUDY;  Surgeon: Carol Ada, MD;  Location: [place];  Service: Endoscopy;  Laterality: N/A;  . Colonoscopy N/A 10/28/2014    Procedure: COLONOSCOPY;  Surgeon: Rogene Houston, MD;  [facility];  Service: Endoscopy;  Laterality: N/A;   730    Allergies  Allergen Reactions  . Morphine And Related Itching    Current Outpatient Prescriptions  Medication Sig Dispense Refill  . aspirin 81 MG tablet Take 1 tablet (81 mg total) by mouth daily. HOLD FOR 1 WEEK, THEN RESUME IF NO BLEEDING PROBLEMS 30 tablet   . atorvastatin (LIPITOR) 40 MG tablet Take 1 tablet (40 mg total) by mouth daily. 90 tablet 3  . bisoprolol-hydrochlorothiazide (ZIAC) 2.5-6.25 MG per tablet Take 1 tablet by mouth daily.    . Canagliflozin (INVOKANA) 300 MG TABS Take 300 mg by mouth every morning.    . ferrous sulfate 325 (65 FE) MG EC tablet Take 1 tablet (325 mg total) by mouth 2 (two) times daily after a meal.  3  . finasteride (PROSCAR) 5 MG tablet Take 5 mg by mouth daily.    . metFORMIN (GLUMETZA) 1000 MG (MOD) 24 hr tablet Take 1,000 mg by mouth 2 (two) times daily.     . pantoprazole (PROTONIX) 40 MG tablet Take 40 mg by mouth daily.   3  . ramipril (ALTACE) 10 MG capsule Take 10 mg by mouth daily.    . saxagliptin HCl (ONGLYZA) 5 MG TABS tablet Take 5 mg by mouth daily.     . tamsulosin (FLOMAX) 0.4 MG CAPS capsule Take 0.4 mg by mouth.    . vitamin B-12 (CYANOCOBALAMIN) 1000 MCG tablet Take 1 tablet (1,000 mcg total) by mouth daily. 30 tablet 1   No current facility-administered medications for this visit.   Social he is married and has 2 children 3 grandchildren. 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 There is a remote tobacco history having quit over 10 years ago. His alcohol use.  ROS General: Negative; No fevers, chills, or night sweats;  HEENT: Negative; No changes in vision or hearing, sinus congestion, difficulty swallowing Pulmonary: Negative; No cough, wheezing, shortness of breath, hemoptysis Cardiovascular: Negative; No chest pain, presyncope, syncope, palpatations GI: Positive for GERD No nausea, vomiting, diarrhea, or abdominal pain GU: Negative; No dysuria, hematuria, or difficulty voiding Musculoskeletal: Negative; no  myalgias, joint pain, or weakness Hematologic/Oncology: Negative; no easy bruising, bleeding Endocrine: Positive for diabetes; no heat/cold intolerance;  Neuro: Negative; no changes in balance, headaches Skin: Negative; No rashes or skin lesions Psychiatric: Negative; No behavioral problems, depression Sleep: Negative; No snoring, daytime sleepiness, hypersomnolence, bruxism, restless legs, hypnogognic hallucinations, no cataplexy Other comprehensive 14 point system review is negative.   PE BP 140/70 mmHg  Pulse 55  Ht '5\' 5"'  (1.651 m)  Wt 174 lb 6.4 oz (79.107 kg)  BMI 29.02 kg/m2  Repeat 140/78 General: Alert, oriented, no distress.  Skin: normal turgor, no rashes HEENT: Normocephalic, atraumatic. Pupils round and reactive; sclera anicteric;no lid lag. Nose without nasal septal hypertrophy Mouth/Parynx benign; Mallinpatti scale 2 Neck: No JVD, no carotid bruits with normal carotid upstroke  Lungs: clear to ausculatation and percussion; no wheezing or rales Chest wall: Nontender to palpation Heart: RRR, s1 s2 normal 1/6 systolic murmur.  No diastolic murmur.  No S3 or S4 gallop.  No rubs, thrills or heaves. Abdomen: Mild central adiposity. soft, nontender; no hepatosplenomehaly, BS+; abdominal aorta nontender and not dilated by palpation. Back: No CVA tenderness Pulses 2+ Extremities: no clubbing cyanosis or edema, Homan's sign negative  Neurologic: grossly nonfocal Psychological: Normal affect and mood  ECG (independently read by me):  Sinus bradycardia 55 bpm.  No ectopy.  Normal intervals.   June 2015 ECG (independently read by me): Sinus rhythm 57 beats per minute.  Normal intervals.  No ST changes.  ECG: Sinus rhythm at 59 beats per minute.  LABS: BMP Latest Ref Rng 10/03/2014 07/12/2012 08/24/2010  Glucose 70 - 99 mg/dL 134(H) 127(H) -  BUN 6 - 23 mg/dL 10 14 -  Creatinine 0.50 - 1.35 mg/dL 0.83 0.78 0.8  Sodium 135 - 145 mmol/L 137 139 142  Potassium 3.5 - 5.1 mmol/L  4.1 4.4 4.3  Chloride 96 - 112 mmol/L 101 101 -  CO2 19 - 32 mmol/L 26 28 -  Calcium 8.4 - 10.5 mg/dL 9.2 10.3 -   Hepatic Function Latest Ref Rng 10/03/2014 08/24/2010 06/26/2007  Total Protein 6.0 - 8.3 g/dL 6.9 - 7.2  Albumin 3.5 - 5.2 g/dL 4.0 - 4.1  AST 0 - 37 U/L '20 20 29  ' ALT 0 - 53 U/L '13 20 28  ' Alk Phosphatase 39 - 117 U/L 62 - 77  Total Bilirubin 0.3 - 1.2 mg/dL 0.7 - 0.5   CBC Latest Ref Rng 10/28/2014 10/05/2014 10/04/2014  WBC 4.0 - 10.5 K/uL - - 6.5  Hemoglobin 13.0 - 17.0 g/dL 10.7(L) 9.0(L) 8.6(L)  Hematocrit 39.0 - 52.0 % 37.6(L) 30.8(L) 28.8(L)  Platelets 150 - 400 K/uL - - 264   Lab Results  Component Value Date   MCV 67.8* 10/04/2014   MCV 63.3* 10/04/2014   MCV 59.4* 10/03/2014   No results found for: TSH   No results found for: HGBA1C   Lipid Panel  No results found for: CHOL, TRIG, HDL, CHOLHDL, VLDL, LDLCALC, LDLDIRECT    ASSESSMENT AND PLAN: Luke [name] is a 64 years old and is 15 years status post cardiac catheterization which demonstrated mild/moderate CAD.  He has been on medical therapy and has been without recurrent symptomatology.  His last nuclear perfusion study in April 2012 showed normal perfusion.  He has a history of hypertension.  There also is a history of chronic iron deficiency anemia.  He was recently hospitalized with profound anemia and required 4 units of blood cell transfusion.  He states he underwent third GI evaluation but was also recommended that he possibly seek further evaluation of the small intestines at [facility]. His hemoglobin and hematocrit have risen with iron replacement therapy as well as B12 therapy.  His MCV is increased from 58-69 and is still low. His anemia, he did not develop any episodes of chest pain or anginal type symptomatology. His blood pressure medication was reduced when he was mildly hypotensive in the setting of his anemia.  His blood pressure today is 140/70.  He is bradycardic with a pulse of 55 on his reduced  dose of bisoprolol HCT Z.  He continues to tolerate ran a pill 10 mg.  He will be following up with his GI physicians.  Cardiac-wise he remains fairly stable.  I will see him in one  year for reevaluation.   Time spent: 25 minutes   Troy Sine, MD, [facility]  01/01/2015 12:16 PM

## 2015-01-07 ENCOUNTER — Encounter: Payer: Self-pay | Admitting: Cardiovascular Disease

## 2015-01-07 ENCOUNTER — Telehealth (INDEPENDENT_AMBULATORY_CARE_PROVIDER_SITE_OTHER): Payer: Self-pay | Admitting: *Deleted

## 2015-01-07 DIAGNOSIS — D509 Iron deficiency anemia, unspecified: Secondary | ICD-10-CM | POA: Diagnosis not present

## 2015-01-07 NOTE — Telephone Encounter (Signed)
Per Dr.Rehman the patient will need to have labs drawn the week of 01/06/15.

## 2015-01-09 ENCOUNTER — Other Ambulatory Visit: Payer: Self-pay | Admitting: *Deleted

## 2015-01-09 ENCOUNTER — Other Ambulatory Visit (INDEPENDENT_AMBULATORY_CARE_PROVIDER_SITE_OTHER): Payer: Self-pay | Admitting: Internal Medicine

## 2015-01-09 MED ORDER — PANTOPRAZOLE SODIUM 40 MG PO TBEC: 40.0000 mg | DELAYED_RELEASE_TABLET | Freq: Every day | ORAL | Status: DC

## 2015-01-09 MED ORDER — RAMIPRIL 10 MG PO CAPS: 10.0000 mg | ORAL_CAPSULE | Freq: Every day | ORAL | Status: DC

## 2015-01-09 MED ORDER — ATORVASTATIN CALCIUM 40 MG PO TABS: 40.0000 mg | ORAL_TABLET | Freq: Every day | ORAL | Status: DC

## 2015-01-09 MED ORDER — BISOPROLOL-HYDROCHLOROTHIAZIDE 2.5-6.25 MG PO TABS: 1.0000 | ORAL_TABLET | Freq: Every day | ORAL | Status: DC

## 2015-01-09 NOTE — Telephone Encounter (Signed)
Rx sent to his pharmacy

## 2015-01-10 ENCOUNTER — Encounter (INDEPENDENT_AMBULATORY_CARE_PROVIDER_SITE_OTHER): Payer: Self-pay

## 2015-01-10 ENCOUNTER — Telehealth (INDEPENDENT_AMBULATORY_CARE_PROVIDER_SITE_OTHER): Payer: Self-pay | Admitting: *Deleted

## 2015-01-10 DIAGNOSIS — D6489 Other specified anemias: Secondary | ICD-10-CM

## 2015-01-10 NOTE — Telephone Encounter (Signed)
Per Dr.Rehman the patient will need to have labs drawn in 8 weeks. 

## 2015-01-13 ENCOUNTER — Other Ambulatory Visit: Payer: Self-pay

## 2015-01-13 DIAGNOSIS — M9903 Segmental and somatic dysfunction of lumbar region: Secondary | ICD-10-CM | POA: Diagnosis not present

## 2015-01-13 DIAGNOSIS — M9902 Segmental and somatic dysfunction of thoracic region: Secondary | ICD-10-CM | POA: Diagnosis not present

## 2015-01-13 DIAGNOSIS — M9904 Segmental and somatic dysfunction of sacral region: Secondary | ICD-10-CM | POA: Diagnosis not present

## 2015-01-13 DIAGNOSIS — M9901 Segmental and somatic dysfunction of cervical region: Secondary | ICD-10-CM | POA: Diagnosis not present

## 2015-01-13 DIAGNOSIS — M5136 Other intervertebral disc degeneration, lumbar region: Secondary | ICD-10-CM | POA: Diagnosis not present

## 2015-01-13 MED ORDER — ATORVASTATIN CALCIUM 40 MG PO TABS: 40.0000 mg | ORAL_TABLET | Freq: Every day | ORAL | Status: DC

## 2015-01-13 MED ORDER — BISOPROLOL-HYDROCHLOROTHIAZIDE 2.5-6.25 MG PO TABS: 1.0000 | ORAL_TABLET | Freq: Every day | ORAL | Status: DC

## 2015-01-13 MED ORDER — RAMIPRIL 10 MG PO CAPS: 10.0000 mg | ORAL_CAPSULE | Freq: Every day | ORAL | Status: DC

## 2015-01-13 NOTE — Telephone Encounter (Signed)
Rx(s) sent to pharmacy electronically.  

## 2015-01-16 ENCOUNTER — Other Ambulatory Visit (INDEPENDENT_AMBULATORY_CARE_PROVIDER_SITE_OTHER): Payer: Self-pay | Admitting: Internal Medicine

## 2015-02-11 ENCOUNTER — Encounter (INDEPENDENT_AMBULATORY_CARE_PROVIDER_SITE_OTHER): Payer: Self-pay | Admitting: *Deleted

## 2015-02-11 ENCOUNTER — Other Ambulatory Visit (INDEPENDENT_AMBULATORY_CARE_PROVIDER_SITE_OTHER): Payer: Self-pay | Admitting: *Deleted

## 2015-02-11 DIAGNOSIS — D6489 Other specified anemias: Secondary | ICD-10-CM

## 2015-02-18 ENCOUNTER — Ambulatory Visit (INDEPENDENT_AMBULATORY_CARE_PROVIDER_SITE_OTHER): Payer: Medicare Other | Admitting: Internal Medicine

## 2015-02-18 ENCOUNTER — Telehealth (INDEPENDENT_AMBULATORY_CARE_PROVIDER_SITE_OTHER): Payer: Self-pay | Admitting: *Deleted

## 2015-02-18 ENCOUNTER — Encounter (INDEPENDENT_AMBULATORY_CARE_PROVIDER_SITE_OTHER): Payer: Self-pay | Admitting: Internal Medicine

## 2015-02-18 VITALS — BP 130/70 | HR 72 | Temp 97.9°F | Resp 18 | Ht 65.0 in | Wt 177.1 lb

## 2015-02-18 DIAGNOSIS — D509 Iron deficiency anemia, unspecified: Secondary | ICD-10-CM | POA: Diagnosis not present

## 2015-02-18 DIAGNOSIS — I2510 Atherosclerotic heart disease of native coronary artery without angina pectoris: Secondary | ICD-10-CM | POA: Diagnosis not present

## 2015-02-18 DIAGNOSIS — K589 Irritable bowel syndrome without diarrhea: Secondary | ICD-10-CM | POA: Diagnosis not present

## 2015-02-18 DIAGNOSIS — K219 Gastro-esophageal reflux disease without esophagitis: Secondary | ICD-10-CM | POA: Diagnosis not present

## 2015-02-18 MED ORDER — LOPERAMIDE HCL 2 MG PO CAPS: 2.0000 mg | ORAL_CAPSULE | Freq: Every day | ORAL | Status: DC

## 2015-02-18 MED ORDER — BENEFIBER DRINK MIX PO PACK: 4.0000 g | PACK | Freq: Every day | ORAL | Status: DC

## 2015-02-18 NOTE — Progress Notes (Signed)
Presenting complaint;  Follow-up for iron deficiency anemia and GERD.   Database;  Patient is 64 year old Caucasian male who presents for scheduled visit. He has history of iron deficiency anemia which was diagnosed a few years ago. He was briefly admitted to [facility] in April 2016 for hemoglobin of 5.5 g. He was given transfusion. He gave history of melena. 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 Small bowel given capsule study was unremarkable. He underwent colonoscopy on 10/28/2014 by me without significant findings. Patient is advised to continue ferrous sulfate and now presents for scheduled visit.  Subjective:  Patient feels well. He states heartburn well controlled with therapy. He denies dysphagia nausea or vomiting. He had tooth pulled yesterday and took 5 tablets of Advil. He says he does not take very often. He denies melena or rectal bleeding. He complains of diarrhea with as many as 6-8 stools per day. Most of his bowel movements occur after meals. He has had diarrhea for about a year. His appetite is good and his weight is stable. He denies weakness or fatigue.    Current Medications: Outpatient Encounter Prescriptions as of 02/18/2015  Medication Sig  . aspirin 81 MG tablet Take 1 tablet (81 mg total) by mouth daily. HOLD FOR 1 WEEK, THEN RESUME IF NO BLEEDING PROBLEMS  . atorvastatin (LIPITOR) 40 MG tablet Take 1 tablet (40 mg total) by mouth daily.  . bisoprolol-hydrochlorothiazide (ZIAC) 2.5-6.25 MG per tablet Take 1 tablet by mouth daily.  . Canagliflozin (INVOKANA) 300 MG TABS Take 300 mg by mouth every morning.  . ferrous sulfate 325 (65 FE) MG EC tablet Take 1 tablet (325 mg total) by mouth 2 (two) times daily after a meal.  . finasteride (PROSCAR) 5 MG tablet Take 5 mg by mouth daily.  . metFORMIN (GLUMETZA) 1000 MG (MOD) 24 hr tablet Take 1,000 mg by mouth 2 (two) times daily.   . pantoprazole (PROTONIX) 40 MG  tablet Take 1 tablet (40 mg total) by mouth daily.  . ramipril (ALTACE) 10 MG capsule Take 1 capsule (10 mg total) by mouth daily.  . saxagliptin HCl (ONGLYZA) 5 MG TABS tablet Take 5 mg by mouth daily.   . tamsulosin (FLOMAX) 0.4 MG CAPS capsule Take 0.4 mg by mouth.  . [DISCONTINUED] vitamin B-12 (CYANOCOBALAMIN) 1000 MCG tablet Take 1 tablet (1,000 mcg total) by mouth daily. (Patient not taking: Reported on 02/18/2015)   No facility-administered encounter medications on file as of 02/18/2015.     Objective: Blood pressure 130/70, pulse 72, temperature 97.9 F (36.6 C), temperature source Oral, resp. rate 18, height 5\' 5"  (1.651 m), weight 177 lb 1.6 oz (80.332 kg). Patient is alert and in no acute distress. Conjunctiva is pink. Sclera is nonicteric Oropharyngeal mucosa is normal. No neck masses or thyromegaly noted. Cardiac exam with regular rhythm normal S1 and S2. Faint systolic ejection murmur best heard at aortic area. Lungs are clear to auscultation. Abdomen is symmetrical with periumblical scaly erythematous rash. Abdomen is soft and nontender without organomegaly or masses. No LE edema or clubbing noted.  Labs/studies Results:  Lab data from 01/07/2015 H&H 12 and 37.2 MCV 78  Assessment:  #1. IDA. Patient has history of iron deficiency anemia dating back to about 5 years ago but hemoglobin last year was over 14 g. 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 Hemoglobin has come  up nicely with oral iron but not normal yet. #2. GERD. Heartburn is well controlled with therapy. #3. IBS. Diarrhea is typical of IBS. Sigmoid colon biopsy in May 2016 was negative for microscopic colitis. He is not a candidate for anti-spasmodic therapy because of BPH.    Plan:  CBC and serum ferritin on 03/07/2015. Imodium OTC 2 mg by mouth every morning. Benefiber 4 g by mouth daily at bedtime(or  equivalent). Patient will call with progress report regarding frequency of stools in one month. Office visit in 6 months.

## 2015-02-18 NOTE — Telephone Encounter (Signed)
Per Dr.Rehman the patient will need to have labs drawn on 03/07/2015.

## 2015-02-18 NOTE — Patient Instructions (Signed)
Blood work on 03/07/2015 as planned. Take Imodium OTC 2 mg by mouth every morning. Benefiber 4 g or equivalent by mouth daily at bedtime. Call with progress report regarding frequency of stools in one month

## 2015-03-07 DIAGNOSIS — D509 Iron deficiency anemia, unspecified: Secondary | ICD-10-CM | POA: Diagnosis not present

## 2015-03-14 ENCOUNTER — Encounter (INDEPENDENT_AMBULATORY_CARE_PROVIDER_SITE_OTHER): Payer: Self-pay

## 2015-03-18 DIAGNOSIS — H2511 Age-related nuclear cataract, right eye: Secondary | ICD-10-CM | POA: Diagnosis not present

## 2015-03-18 DIAGNOSIS — E1165 Type 2 diabetes mellitus with hyperglycemia: Secondary | ICD-10-CM | POA: Diagnosis not present

## 2015-03-18 DIAGNOSIS — E119 Type 2 diabetes mellitus without complications: Secondary | ICD-10-CM | POA: Diagnosis not present

## 2015-03-18 DIAGNOSIS — H2513 Age-related nuclear cataract, bilateral: Secondary | ICD-10-CM | POA: Diagnosis not present

## 2015-03-26 ENCOUNTER — Encounter (INDEPENDENT_AMBULATORY_CARE_PROVIDER_SITE_OTHER): Payer: Self-pay | Admitting: *Deleted

## 2015-03-26 ENCOUNTER — Telehealth (INDEPENDENT_AMBULATORY_CARE_PROVIDER_SITE_OTHER): Payer: Self-pay | Admitting: *Deleted

## 2015-03-26 NOTE — Telephone Encounter (Signed)
Dr.Rehman will be made aware. 

## 2015-03-26 NOTE — Telephone Encounter (Signed)
Progress Report - Dr. Laural Golden asked him to call with how may times he is going to the bathroom. It has went down from 10 to 2 times daily.

## 2015-05-01 DIAGNOSIS — Z6828 Body mass index (BMI) 28.0-28.9, adult: Secondary | ICD-10-CM | POA: Diagnosis not present

## 2015-05-01 DIAGNOSIS — E663 Overweight: Secondary | ICD-10-CM | POA: Diagnosis not present

## 2015-05-01 DIAGNOSIS — E1165 Type 2 diabetes mellitus with hyperglycemia: Secondary | ICD-10-CM | POA: Diagnosis not present

## 2015-05-01 DIAGNOSIS — Z23 Encounter for immunization: Secondary | ICD-10-CM | POA: Diagnosis not present

## 2015-05-01 DIAGNOSIS — I10 Essential (primary) hypertension: Secondary | ICD-10-CM | POA: Diagnosis not present

## 2015-05-01 DIAGNOSIS — Z1389 Encounter for screening for other disorder: Secondary | ICD-10-CM | POA: Diagnosis not present

## 2015-06-06 DIAGNOSIS — D649 Anemia, unspecified: Secondary | ICD-10-CM | POA: Diagnosis not present

## 2015-07-23 DIAGNOSIS — N401 Enlarged prostate with lower urinary tract symptoms: Secondary | ICD-10-CM | POA: Diagnosis not present

## 2015-07-23 DIAGNOSIS — Z125 Encounter for screening for malignant neoplasm of prostate: Secondary | ICD-10-CM | POA: Diagnosis not present

## 2015-07-28 DIAGNOSIS — M9903 Segmental and somatic dysfunction of lumbar region: Secondary | ICD-10-CM | POA: Diagnosis not present

## 2015-07-28 DIAGNOSIS — M9902 Segmental and somatic dysfunction of thoracic region: Secondary | ICD-10-CM | POA: Diagnosis not present

## 2015-07-28 DIAGNOSIS — M9901 Segmental and somatic dysfunction of cervical region: Secondary | ICD-10-CM | POA: Diagnosis not present

## 2015-07-28 DIAGNOSIS — M9904 Segmental and somatic dysfunction of sacral region: Secondary | ICD-10-CM | POA: Diagnosis not present

## 2015-07-30 DIAGNOSIS — R3916 Straining to void: Secondary | ICD-10-CM | POA: Diagnosis not present

## 2015-07-30 DIAGNOSIS — Z0000 Encounter for general adult medical examination without abnormal findings: Secondary | ICD-10-CM | POA: Diagnosis not present

## 2015-07-30 DIAGNOSIS — N5201 Erectile dysfunction due to arterial insufficiency: Secondary | ICD-10-CM | POA: Diagnosis not present

## 2015-07-30 DIAGNOSIS — N401 Enlarged prostate with lower urinary tract symptoms: Secondary | ICD-10-CM | POA: Diagnosis not present

## 2015-08-01 DIAGNOSIS — Z1389 Encounter for screening for other disorder: Secondary | ICD-10-CM | POA: Diagnosis not present

## 2015-08-01 DIAGNOSIS — E119 Type 2 diabetes mellitus without complications: Secondary | ICD-10-CM | POA: Diagnosis not present

## 2015-08-01 DIAGNOSIS — E663 Overweight: Secondary | ICD-10-CM | POA: Diagnosis not present

## 2015-08-01 DIAGNOSIS — I10 Essential (primary) hypertension: Secondary | ICD-10-CM | POA: Diagnosis not present

## 2015-08-01 DIAGNOSIS — E1165 Type 2 diabetes mellitus with hyperglycemia: Secondary | ICD-10-CM | POA: Diagnosis not present

## 2015-08-01 DIAGNOSIS — Z6829 Body mass index (BMI) 29.0-29.9, adult: Secondary | ICD-10-CM | POA: Diagnosis not present

## 2015-08-26 ENCOUNTER — Encounter (INDEPENDENT_AMBULATORY_CARE_PROVIDER_SITE_OTHER): Payer: Self-pay | Admitting: Internal Medicine

## 2015-08-26 ENCOUNTER — Ambulatory Visit (INDEPENDENT_AMBULATORY_CARE_PROVIDER_SITE_OTHER): Payer: Medicare Other | Admitting: Internal Medicine

## 2015-08-26 ENCOUNTER — Encounter (INDEPENDENT_AMBULATORY_CARE_PROVIDER_SITE_OTHER): Payer: Self-pay

## 2015-08-26 VITALS — BP 180/90 | HR 72 | Temp 97.4°F | Ht 66.0 in | Wt 179.0 lb

## 2015-08-26 DIAGNOSIS — D509 Iron deficiency anemia, unspecified: Secondary | ICD-10-CM | POA: Diagnosis not present

## 2015-08-26 DIAGNOSIS — D62 Acute posthemorrhagic anemia: Secondary | ICD-10-CM

## 2015-08-26 NOTE — Patient Instructions (Addendum)
OV in 6 months. Labs from [place]

## 2015-08-26 NOTE — Progress Notes (Signed)
Subjective:    Patient ID: Luke [name], male    DOB: 11-26-1950, 65 y.o.   MRN: [id number]  HPIHere today for f/u of his iron deficiency anemia. He tells me his stools are light brown. Feels good. Continues to work part-time at [employer].  His appetite is good. No weight loss.   Last seen by Dr. Laural Golden in August of 2016. Hx of iron deficiency anemia which was diagnosed a few years ago. He was admitted to [facility] in April of 2016 with a hemoglobin of 5.5. He received blood transfusion. He gave a hx of melena.  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 Benson Norway. Small blowel Given's capsule study was negative. He underwent a colonoscopy in May of 2016 without any significant finding. Presently maintained on Iron.  1216/2016 H and H 14.5 and 42.9. 03/07/2015 H and H 12.5 and 39.4, MCV 79. Platelet ct 293, ferritin 13.   01/07/2015 H and H 12.0 and 37.2, MCV 78.   10/28/2014   Colonoscopy  Indications:  Patient is 65 year old Caucasian male with history of iron deficiency anemia with workup in 2010. He was seen by me in April 2015 and his hemoglobin was 14 g. Stool was guaiac negative. By mouth iron was discontinued. He now presents with few weeks history of melena and noted to have hemoglobin of 5.5 g. He was admitted to [facility] and underwent enteroscopy with APC ablation of to jejunal AV malformations and given capsule study by Dr. Carol Ada. These lesions are felt not to be the source of bleeding. He was advised outpatient col Impression:   Normal mucosa of terminal ileum. Focal edema/thickening to mucosal fold at sigmoid colon. Biopsy taken.   Biopsy from: Is negative for adenoma. Results reviewed with patient.    Review of Systems Past Medical History  Diagnosis Date  . Hematochezia 11/19/2009  . Iron deficiency anemia 11/19/2009  . Recurrent pancreatitis ([place]) 11/19/2009  . Hypertension   . Sleep apnea     study 20 yrs ago. no  cpap used now  . CAD (coronary artery disease) 10/02/2010    nuclear study show normal perfusion on medical therapy without scar or ischemia. EF 64%  . Diabetes mellitus ([place])     type 2   . Hyperlipidemia   . Osteoarthritis     left knee  . Heart murmur   . GERD (gastroesophageal reflux disease)     Past Surgical History  Procedure Laterality Date  . Colonoscopy  02/14/06    M JENKINS  . Ercp  12/25/1996    ROURK  . Upper gastrointestinal endoscopy  11/08/2008    EGD TCS  . Small bowel givens  11/25/2008  . Joint replacement  07-12-12    2008/2007 -knee replacements  . Circumcision  07/17/2012    Procedure: CIRCUMCISION ADULT;  Surgeon: Dutch Gray, MD;  [facility]: [facility];  Service: Urology;  Laterality: N/A;  . Cardiac catheterization  08/1999    which revealed a mild coronary artery disease with 60 and 70 and 80% stenosis in a small first diagonal vessel at the LAD, 50% mid LAD stenosis, 50% ostial left circumflrx stenosis, and 40% ostial and proximal intermediate stenosis. he had 10 to 20% irregularities of his mid right coronary artery.  . Enteroscopy N/A 10/04/2014    Procedure: ENTEROSCOPY;  Surgeon: Carol Ada, MD;  Location: [place];  Service: Endoscopy;  Laterality: N/A;  . Givens capsule study N/A  10/04/2014    Procedure: GIVENS CAPSULE STUDY;  Surgeon: Carol Ada, MD;  Location: [facility] ENDOSCOPY;  Service: Endoscopy;  Laterality: N/A;  . Colonoscopy N/A 10/28/2014    Procedure: COLONOSCOPY;  Surgeon: Rogene Houston, MD;  [facility];  Service: Endoscopy;  Laterality: N/A;  730    Allergies  Allergen Reactions  . Morphine And Related Itching    Current Outpatient Prescriptions on File Prior to Visit  Medication Sig Dispense Refill  . aspirin 81 MG tablet Take 1 tablet (81 mg total) by mouth daily. HOLD FOR 1 WEEK, THEN RESUME IF NO BLEEDING PROBLEMS 30 tablet   . atorvastatin (LIPITOR) 40 MG tablet Take 1 tablet (40 mg total) by mouth daily. 90 tablet 3   . bisoprolol-hydrochlorothiazide (ZIAC) 2.5-6.25 MG per tablet Take 1 tablet by mouth daily. 90 tablet 3  . Canagliflozin (INVOKANA) 300 MG TABS Take 300 mg by mouth every morning.    . ferrous sulfate 325 (65 FE) MG EC tablet Take 1 tablet (325 mg total) by mouth 2 (two) times daily after a meal. (Patient taking differently: Take 325 mg by mouth daily with breakfast. )  3  . finasteride (PROSCAR) 5 MG tablet Take 5 mg by mouth daily.    Marland Kitchen loperamide (IMODIUM) 2 MG capsule Take 1 capsule (2 mg total) by mouth daily before breakfast. 1 capsule 5  . metFORMIN (GLUMETZA) 1000 MG (MOD) 24 hr tablet Take 1,000 mg by mouth 2 (two) times daily.     . pantoprazole (PROTONIX) 40 MG tablet Take 1 tablet (40 mg total) by mouth daily. 30 tablet 11  . ramipril (ALTACE) 10 MG capsule Take 1 capsule (10 mg total) by mouth daily. 90 capsule 3  . saxagliptin HCl (ONGLYZA) 5 MG TABS tablet Take 5 mg by mouth daily.     . tamsulosin (FLOMAX) 0.4 MG CAPS capsule Take 0.4 mg by mouth.    . Wheat Dextrin (BENEFIBER DRINK MIX) PACK Take 4 g by mouth at bedtime.     No current facility-administered medications on file prior to visit.        Objective:   Physical ExamBlood pressure 180/90, pulse 72, temperature 97.4 F (36.3 C), height 5\' 6"  (1.676 m), weight 179 lb (81.194 kg).  Alert and oriented. Skin warm and dry. Oral mucosa is moist.   . Sclera anicteric, conjunctivae is pink. Thyroid not enlarged. No cervical lymphadenopathy. Lungs clear. Heart regular rate and rhythm.  Abdomen is soft. Bowel sounds are positive. No hepatomegaly. No abdominal masses felt. No tenderness.  No edema to lower extremities.         Assessment & Plan:  Iron deficiency anemia. Hemogloin in September 12.5. Has had labs in December. Will get from [place]. Three stools cards home with patient. OV in 6 months

## 2015-09-09 ENCOUNTER — Telehealth (INDEPENDENT_AMBULATORY_CARE_PROVIDER_SITE_OTHER): Payer: Self-pay | Admitting: *Deleted

## 2015-09-09 NOTE — Telephone Encounter (Signed)
Results given to wife 

## 2015-09-09 NOTE — Telephone Encounter (Signed)
   Diagnosis:    Result(s)   Card 1: Negative:          Completed by: Thomas Hoff ,LPN   HEMOCCULT SENSA DEVELOPER: LOT#:  [id number] EXPIRATION DATE: 07/18   HEMOCCULT SENSA CARD:  LOT#:  [id number] EXPIRATION DATE: 9-17   CARD CONTROL RESULTS:  POSITIVE: Positive NEGATIVE: negative    ADDITIONAL COMMENTS: Forwarded to [place] for review . Patient has not been called.

## 2015-09-13 DIAGNOSIS — J0100 Acute maxillary sinusitis, unspecified: Secondary | ICD-10-CM | POA: Diagnosis not present

## 2015-11-18 DIAGNOSIS — M9904 Segmental and somatic dysfunction of sacral region: Secondary | ICD-10-CM | POA: Diagnosis not present

## 2015-11-18 DIAGNOSIS — M9903 Segmental and somatic dysfunction of lumbar region: Secondary | ICD-10-CM | POA: Diagnosis not present

## 2015-11-18 DIAGNOSIS — M5136 Other intervertebral disc degeneration, lumbar region: Secondary | ICD-10-CM | POA: Diagnosis not present

## 2015-11-18 DIAGNOSIS — M9901 Segmental and somatic dysfunction of cervical region: Secondary | ICD-10-CM | POA: Diagnosis not present

## 2015-11-18 DIAGNOSIS — M9902 Segmental and somatic dysfunction of thoracic region: Secondary | ICD-10-CM | POA: Diagnosis not present

## 2015-11-19 DIAGNOSIS — M9902 Segmental and somatic dysfunction of thoracic region: Secondary | ICD-10-CM | POA: Diagnosis not present

## 2015-11-19 DIAGNOSIS — M9903 Segmental and somatic dysfunction of lumbar region: Secondary | ICD-10-CM | POA: Diagnosis not present

## 2015-11-19 DIAGNOSIS — M9904 Segmental and somatic dysfunction of sacral region: Secondary | ICD-10-CM | POA: Diagnosis not present

## 2015-11-19 DIAGNOSIS — M5136 Other intervertebral disc degeneration, lumbar region: Secondary | ICD-10-CM | POA: Diagnosis not present

## 2015-11-19 DIAGNOSIS — M9901 Segmental and somatic dysfunction of cervical region: Secondary | ICD-10-CM | POA: Diagnosis not present

## 2015-11-21 DIAGNOSIS — M9903 Segmental and somatic dysfunction of lumbar region: Secondary | ICD-10-CM | POA: Diagnosis not present

## 2015-11-21 DIAGNOSIS — M9904 Segmental and somatic dysfunction of sacral region: Secondary | ICD-10-CM | POA: Diagnosis not present

## 2015-11-21 DIAGNOSIS — M9901 Segmental and somatic dysfunction of cervical region: Secondary | ICD-10-CM | POA: Diagnosis not present

## 2015-11-21 DIAGNOSIS — M9902 Segmental and somatic dysfunction of thoracic region: Secondary | ICD-10-CM | POA: Diagnosis not present

## 2015-11-21 DIAGNOSIS — M5136 Other intervertebral disc degeneration, lumbar region: Secondary | ICD-10-CM | POA: Diagnosis not present

## 2015-11-24 DIAGNOSIS — Z6828 Body mass index (BMI) 28.0-28.9, adult: Secondary | ICD-10-CM | POA: Diagnosis not present

## 2015-11-24 DIAGNOSIS — E1165 Type 2 diabetes mellitus with hyperglycemia: Secondary | ICD-10-CM | POA: Diagnosis not present

## 2015-11-24 DIAGNOSIS — Z1389 Encounter for screening for other disorder: Secondary | ICD-10-CM | POA: Diagnosis not present

## 2015-11-24 DIAGNOSIS — I10 Essential (primary) hypertension: Secondary | ICD-10-CM | POA: Diagnosis not present

## 2015-11-28 DIAGNOSIS — M9904 Segmental and somatic dysfunction of sacral region: Secondary | ICD-10-CM | POA: Diagnosis not present

## 2015-11-28 DIAGNOSIS — M5136 Other intervertebral disc degeneration, lumbar region: Secondary | ICD-10-CM | POA: Diagnosis not present

## 2015-11-28 DIAGNOSIS — M9901 Segmental and somatic dysfunction of cervical region: Secondary | ICD-10-CM | POA: Diagnosis not present

## 2015-11-28 DIAGNOSIS — M9903 Segmental and somatic dysfunction of lumbar region: Secondary | ICD-10-CM | POA: Diagnosis not present

## 2015-11-28 DIAGNOSIS — M9902 Segmental and somatic dysfunction of thoracic region: Secondary | ICD-10-CM | POA: Diagnosis not present

## 2015-12-05 DIAGNOSIS — M9902 Segmental and somatic dysfunction of thoracic region: Secondary | ICD-10-CM | POA: Diagnosis not present

## 2015-12-05 DIAGNOSIS — M9901 Segmental and somatic dysfunction of cervical region: Secondary | ICD-10-CM | POA: Diagnosis not present

## 2015-12-05 DIAGNOSIS — M9903 Segmental and somatic dysfunction of lumbar region: Secondary | ICD-10-CM | POA: Diagnosis not present

## 2015-12-05 DIAGNOSIS — M5136 Other intervertebral disc degeneration, lumbar region: Secondary | ICD-10-CM | POA: Diagnosis not present

## 2015-12-05 DIAGNOSIS — M9904 Segmental and somatic dysfunction of sacral region: Secondary | ICD-10-CM | POA: Diagnosis not present

## 2015-12-12 DIAGNOSIS — M9901 Segmental and somatic dysfunction of cervical region: Secondary | ICD-10-CM | POA: Diagnosis not present

## 2015-12-12 DIAGNOSIS — M5136 Other intervertebral disc degeneration, lumbar region: Secondary | ICD-10-CM | POA: Diagnosis not present

## 2015-12-12 DIAGNOSIS — M9903 Segmental and somatic dysfunction of lumbar region: Secondary | ICD-10-CM | POA: Diagnosis not present

## 2015-12-12 DIAGNOSIS — M9904 Segmental and somatic dysfunction of sacral region: Secondary | ICD-10-CM | POA: Diagnosis not present

## 2015-12-12 DIAGNOSIS — M9902 Segmental and somatic dysfunction of thoracic region: Secondary | ICD-10-CM | POA: Diagnosis not present

## 2015-12-18 DIAGNOSIS — L821 Other seborrheic keratosis: Secondary | ICD-10-CM | POA: Diagnosis not present

## 2015-12-18 DIAGNOSIS — D2271 Melanocytic nevi of right lower limb, including hip: Secondary | ICD-10-CM | POA: Diagnosis not present

## 2015-12-18 DIAGNOSIS — D692 Other nonthrombocytopenic purpura: Secondary | ICD-10-CM | POA: Diagnosis not present

## 2015-12-18 DIAGNOSIS — L918 Other hypertrophic disorders of the skin: Secondary | ICD-10-CM | POA: Diagnosis not present

## 2015-12-18 DIAGNOSIS — D2262 Melanocytic nevi of left upper limb, including shoulder: Secondary | ICD-10-CM | POA: Diagnosis not present

## 2015-12-18 DIAGNOSIS — L400 Psoriasis vulgaris: Secondary | ICD-10-CM | POA: Diagnosis not present

## 2015-12-18 DIAGNOSIS — D225 Melanocytic nevi of trunk: Secondary | ICD-10-CM | POA: Diagnosis not present

## 2015-12-18 DIAGNOSIS — D1801 Hemangioma of skin and subcutaneous tissue: Secondary | ICD-10-CM | POA: Diagnosis not present

## 2015-12-19 DIAGNOSIS — M9904 Segmental and somatic dysfunction of sacral region: Secondary | ICD-10-CM | POA: Diagnosis not present

## 2015-12-19 DIAGNOSIS — M9902 Segmental and somatic dysfunction of thoracic region: Secondary | ICD-10-CM | POA: Diagnosis not present

## 2015-12-19 DIAGNOSIS — M9903 Segmental and somatic dysfunction of lumbar region: Secondary | ICD-10-CM | POA: Diagnosis not present

## 2015-12-19 DIAGNOSIS — M5136 Other intervertebral disc degeneration, lumbar region: Secondary | ICD-10-CM | POA: Diagnosis not present

## 2015-12-19 DIAGNOSIS — M9901 Segmental and somatic dysfunction of cervical region: Secondary | ICD-10-CM | POA: Diagnosis not present

## 2016-01-09 DIAGNOSIS — M9904 Segmental and somatic dysfunction of sacral region: Secondary | ICD-10-CM | POA: Diagnosis not present

## 2016-01-09 DIAGNOSIS — M9903 Segmental and somatic dysfunction of lumbar region: Secondary | ICD-10-CM | POA: Diagnosis not present

## 2016-01-09 DIAGNOSIS — M9901 Segmental and somatic dysfunction of cervical region: Secondary | ICD-10-CM | POA: Diagnosis not present

## 2016-01-09 DIAGNOSIS — M5136 Other intervertebral disc degeneration, lumbar region: Secondary | ICD-10-CM | POA: Diagnosis not present

## 2016-01-09 DIAGNOSIS — M9902 Segmental and somatic dysfunction of thoracic region: Secondary | ICD-10-CM | POA: Diagnosis not present

## 2016-02-02 ENCOUNTER — Other Ambulatory Visit: Payer: Self-pay | Admitting: Cardiovascular Disease

## 2016-02-02 ENCOUNTER — Other Ambulatory Visit (INDEPENDENT_AMBULATORY_CARE_PROVIDER_SITE_OTHER): Payer: Self-pay | Admitting: Internal Medicine

## 2016-02-26 ENCOUNTER — Encounter (INDEPENDENT_AMBULATORY_CARE_PROVIDER_SITE_OTHER): Payer: Self-pay | Admitting: Internal Medicine

## 2016-02-26 ENCOUNTER — Ambulatory Visit (INDEPENDENT_AMBULATORY_CARE_PROVIDER_SITE_OTHER): Payer: Medicare Other | Admitting: Internal Medicine

## 2016-02-26 VITALS — BP 160/82 | HR 72 | Temp 97.4°F | Ht 66.0 in | Wt 176.5 lb

## 2016-02-26 DIAGNOSIS — D509 Iron deficiency anemia, unspecified: Secondary | ICD-10-CM | POA: Diagnosis not present

## 2016-02-26 DIAGNOSIS — E509 Vitamin A deficiency, unspecified: Secondary | ICD-10-CM | POA: Diagnosis not present

## 2016-02-26 NOTE — Progress Notes (Signed)
Subjective:    Patient ID: Luke [name], male    DOB: Jan 04, 1951, 65 y.o.   MRN: [id number]  HPI   Here today for f/u of his IDA.  Admitted to [facility] in April of 2016 with a hemoglobin of 5.5.  Hx of melena. 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 Benson Norway. Small bowel Given's capsule study was negative. Colonoscopy in May of 2016 without any significant findings.    He tells me he is doing good. Appetite is good.  He has a BM daily. No melena or BRRB. No abdominal pain.    CBC    Component Value Date/Time   WBC 6.5 10/04/2014 1732   RBC 4.25 10/04/2014 1732   HGB 10.7 (L) 10/28/2014 0850   HCT 37.6 (L) 10/28/2014 0850   PLT 264 10/04/2014 1732   MCV 67.8 (L) 10/04/2014 1732   MCH 20.2 (L) 10/04/2014 1732   MCHC 29.9 (L) 10/04/2014 1732   RDW 24.6 (H) 10/04/2014 1732   LYMPHSABS 1.0 10/03/2014 1010   MONOABS 0.7 10/03/2014 1010   EOSABS 0.1 10/03/2014 1010   BASOSABS 0.1 10/03/2014 1010    1216/2016 H and H 14.5 and 42.9. 03/07/2015 H and H 12.5 and 39.4, MCV 79. Platelet ct 293, ferritin 13.   01/07/2015 H and H 12.0 and 37.2, MCV 78.    10/28/2014   Colonoscopy   Indications:  Patient is 65 year old Caucasian male with history of iron deficiency anemia with workup in 2010. He was seen by me in April 2015 and his hemoglobin was 14 g. Stool was guaiac negative. By mouth iron was discontinued. He now presents with few weeks history of melena and noted to have hemoglobin of 5.5 g. 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 Carol Ada. These lesions are felt not to be the source of bleeding. He was advised outpatient col Impression:   Normal mucosa of terminal ileum. Focal edema/thickening to mucosal fold at sigmoid colon. Biopsy taken.   Biopsy from: Is negative for adenoma. Results reviewed with patient.    Review of Systems Past  Medical History:  Diagnosis Date  . CAD (coronary artery disease) 10/02/2010   nuclear study show normal perfusion on medical therapy without scar or ischemia. EF 64%  . Diabetes mellitus ([place])    type 2   . GERD (gastroesophageal reflux disease)   . Heart murmur   . Hematochezia 11/19/2009  . Hyperlipidemia   . Hypertension   . Iron deficiency anemia 11/19/2009  . Osteoarthritis    left knee  . Recurrent pancreatitis ([place]) 11/19/2009  . Sleep apnea    study 20 yrs ago. no cpap used now    Past Surgical History:  Procedure Laterality Date  . CARDIAC CATHETERIZATION  08/1999   which revealed a mild coronary artery disease with 60 and 70 and 80% stenosis in a small first diagonal vessel at the LAD, 50% mid LAD stenosis, 50% ostial left circumflrx stenosis, and 40% ostial and proximal intermediate stenosis. he had 10 to 20% irregularities of his mid right coronary artery.  Marland Kitchen CIRCUMCISION  07/17/2012   Procedure: CIRCUMCISION ADULT;  Surgeon: Dutch Gray, MD;  [facility];  Service: Urology;  Laterality: N/A;  . COLONOSCOPY  02/14/06   M JENKINS  . COLONOSCOPY N/A 10/28/2014   Procedure: COLONOSCOPY;  Surgeon: Rogene Houston, MD;  [facility];  Service: Endoscopy;  Laterality: N/A;  730  . ENTEROSCOPY N/A 10/04/2014   Procedure: ENTEROSCOPY;  Surgeon: Carol Ada, MD;  Location: [facility] ENDOSCOPY;  Service: Endoscopy;  Laterality: N/A;  . ERCP  12/25/1996   ROURK  . GIVENS CAPSULE STUDY N/A 10/04/2014   Procedure: GIVENS CAPSULE STUDY;  Surgeon: Carol Ada, MD;  Location: [place];  Service: Endoscopy;  Laterality: N/A;  . JOINT REPLACEMENT  07-12-12   2008/2007 -knee replacements  . SMALL BOWEL GIVENS  11/25/2008  . UPPER GASTROINTESTINAL ENDOSCOPY  11/08/2008   EGD TCS    Allergies  Allergen Reactions  . Morphine And Related Itching    Current Outpatient Prescriptions on File Prior to Visit  Medication Sig Dispense Refill  . aspirin 81 MG tablet Take 1  tablet (81 mg total) by mouth daily. HOLD FOR 1 WEEK, THEN RESUME IF NO BLEEDING PROBLEMS 30 tablet   . atorvastatin (LIPITOR) 40 MG tablet TAKE 1 TABLET EVERY DAY 90 tablet 3  . bisoprolol-hydrochlorothiazide (ZIAC) 2.5-6.25 MG tablet TAKE 1 TABLET EVERY DAY 90 tablet 3  . Canagliflozin (INVOKANA) 300 MG TABS Take 300 mg by mouth every morning.    . ferrous sulfate 325 (65 FE) MG EC tablet Take 1 tablet (325 mg total) by mouth 2 (two) times daily after a meal. (Patient taking differently: Take 325 mg by mouth daily with breakfast. )  3  . finasteride (PROSCAR) 5 MG tablet Take 5 mg by mouth daily.    Marland Kitchen loperamide (IMODIUM) 2 MG capsule Take 1 capsule (2 mg total) by mouth daily before breakfast. 1 capsule 5  . metFORMIN (GLUMETZA) 1000 MG (MOD) 24 hr tablet Take 1,000 mg by mouth 2 (two) times daily.     . pantoprazole (PROTONIX) 40 MG tablet TAKE 1 TABLET EVERY DAY 90 tablet 3  . ramipril (ALTACE) 10 MG capsule TAKE 1 CAPSULE EVERY DAY 90 capsule 3  . saxagliptin HCl (ONGLYZA) 5 MG TABS tablet Take 5 mg by mouth daily.     . tamsulosin (FLOMAX) 0.4 MG CAPS capsule Take 0.4 mg by mouth.    . Wheat Dextrin (BENEFIBER DRINK MIX) PACK Take 4 g by mouth at bedtime.     No current facility-administered medications on file prior to visit.        Objective:   Physical Exam Blood pressure (!) 160/82, pulse 72, temperature 97.4 F (36.3 C), height 5\' 6"  (1.676 m), weight 176 lb 8 oz (80.1 kg).  Alert and oriented. Skin warm and dry. Oral mucosa is moist.   . Sclera anicteric, conjunctivae is pink. Thyroid not enlarged. No cervical lymphadenopathy. Lungs clear. Heart regular rate and rhythm.  Abdomen is soft. Bowel sounds are positive. No hepatomegaly. No abdominal masses felt. No tenderness.  No edema to lower extremities.        Assessment & Plan:  IDA. Will give one stool and CBC today. OV in 6 months.

## 2016-02-26 NOTE — Patient Instructions (Signed)
CBC today.  Stool card x 1. OV in 6 months.

## 2016-02-27 ENCOUNTER — Encounter (INDEPENDENT_AMBULATORY_CARE_PROVIDER_SITE_OTHER): Payer: Self-pay

## 2016-03-01 ENCOUNTER — Other Ambulatory Visit (INDEPENDENT_AMBULATORY_CARE_PROVIDER_SITE_OTHER): Payer: Self-pay | Admitting: *Deleted

## 2016-03-01 ENCOUNTER — Telehealth (INDEPENDENT_AMBULATORY_CARE_PROVIDER_SITE_OTHER): Payer: Self-pay | Admitting: Internal Medicine

## 2016-03-01 ENCOUNTER — Other Ambulatory Visit (INDEPENDENT_AMBULATORY_CARE_PROVIDER_SITE_OTHER): Payer: Self-pay | Admitting: Internal Medicine

## 2016-03-01 DIAGNOSIS — D62 Acute posthemorrhagic anemia: Secondary | ICD-10-CM

## 2016-03-01 DIAGNOSIS — D509 Iron deficiency anemia, unspecified: Secondary | ICD-10-CM

## 2016-03-01 NOTE — Telephone Encounter (Signed)
CBC is noted for December 11,2017.

## 2016-03-01 NOTE — Telephone Encounter (Signed)
CBC is noted for 3 months.

## 2016-03-01 NOTE — Telephone Encounter (Signed)
Stool card guaiac positive (very slight). Dr. Laural Golden aware.   CBC in 3 months

## 2016-03-01 NOTE — Telephone Encounter (Signed)
Tammy CBC in 3 months.

## 2016-03-03 ENCOUNTER — Encounter: Payer: Self-pay | Admitting: Cardiovascular Disease

## 2016-03-03 ENCOUNTER — Ambulatory Visit (INDEPENDENT_AMBULATORY_CARE_PROVIDER_SITE_OTHER): Payer: Medicare Other | Admitting: Cardiovascular Disease

## 2016-03-03 VITALS — BP 104/64 | HR 62 | Ht 66.0 in | Wt 175.0 lb

## 2016-03-03 DIAGNOSIS — K922 Gastrointestinal hemorrhage, unspecified: Secondary | ICD-10-CM | POA: Diagnosis not present

## 2016-03-03 DIAGNOSIS — I2583 Coronary atherosclerosis due to lipid rich plaque: Secondary | ICD-10-CM

## 2016-03-03 DIAGNOSIS — I2510 Atherosclerotic heart disease of native coronary artery without angina pectoris: Secondary | ICD-10-CM

## 2016-03-03 DIAGNOSIS — E785 Hyperlipidemia, unspecified: Secondary | ICD-10-CM

## 2016-03-03 DIAGNOSIS — T39395A Adverse effect of other nonsteroidal anti-inflammatory drugs [NSAID], initial encounter: Secondary | ICD-10-CM

## 2016-03-03 DIAGNOSIS — E1129 Type 2 diabetes mellitus with other diabetic kidney complication: Secondary | ICD-10-CM

## 2016-03-03 NOTE — Patient Instructions (Signed)
Fasting lab ( cmet,tsh,lipid panel,a1c )   Your physician wants you to follow-up in: 1 year. You will receive a reminder letter in the mail two months in advance. If you don't receive a letter, please call our office to schedule the follow-up appointment.

## 2016-03-05 ENCOUNTER — Other Ambulatory Visit: Payer: Self-pay | Admitting: Cardiovascular Disease

## 2016-03-05 DIAGNOSIS — E785 Hyperlipidemia, unspecified: Secondary | ICD-10-CM | POA: Diagnosis not present

## 2016-03-05 DIAGNOSIS — E1129 Type 2 diabetes mellitus with other diabetic kidney complication: Secondary | ICD-10-CM | POA: Diagnosis not present

## 2016-03-05 DIAGNOSIS — I2510 Atherosclerotic heart disease of native coronary artery without angina pectoris: Secondary | ICD-10-CM | POA: Diagnosis not present

## 2016-03-05 NOTE — Progress Notes (Signed)
Patient ID: Luke [name], male   DOB: [birth year], 65 y.o.   MRN: [id number]      HPI: Luke [name] is a 65 y.o. male who presents to the office for a one-year followup evaluation.   Luke [name] has documented mild CAD by cardiac catheterization in 2001 which revealed segmental 60-80% stenoses and a small first diagonal vessel of the LAD, 50% mid LAD stenosis, 50% ostial left circumflex stenosis, and 30% ostial and proximal intermediate stenosis. Mild luminal regularity his of his right coronary artery. He has been on aggressive medical therapy since that time and has remained stable.  Additional problems include mixed hyperlipidemia requiring combination therapy, hypertension, and type 2 diabetes mellitus.  Over the past year, he has continued to do well with reference to chest pain or anginal symptoms.  He has been on Caduet 5/40, tramadol, 10 mg in addition to Ziac 2.5/6.25 mg for blood pressure control.  He now is on multiple drug therapy for his diabetes including invokana, Levemir, metformin, as well as onglyza.   since I last saw him, he was found to develop a profound anemia with a hemoglobin dropped to 5.3 and hematocrit 20.6 in April 2016. MCV was 58.  He was hospitalized and received 4 units of blood transfusion apparently underwent GI evaluation with Dr. Sandi Mealy as well as with Dr. Jearld Adjutant.  He was felt to have acute anemia secondary to iron deficiency and B12 deficiency.  No definitive source of bleeding was demonstrated on his GI studies.  Apparently there was some question of possibly referring him to [facility] for small intestine evaluation  Despite his  profound anemia he did not experience any chest pain , PND or orthopnea.  He had subsequent blood work obtained in [place] hemoglobin to 11.7 and hematocrit 37.3.  MCV had risen to 69.  He continues to be on iron replacement as well as B12 replacement therapy.  He states that time, she still is passing some blood in his stool.  He just had  repeat blood work done in Dr. Chrys Racer office this week and he was told that his hemoglobin was now 14.4.  With reference to blood pressure control, he is on ramipril 10 mg, bisoprolol HCTZ 2.5/6.25 mg,.  He is on Lipitor 40 mg for hyperlipidemia.  He is on  metformin, and invokana for his diabetes mellitus.  He denies any chest pain.  He is playing golf one time per week and typically walks his dogs daily.  He denies PND, orthopnea.  There is no presyncope or syncope.   Past Medical History:  Diagnosis Date  . CAD (coronary artery disease) 10/02/2010   nuclear study show normal perfusion on medical therapy without scar or ischemia. EF 64%  . Diabetes mellitus ([place])    type 2   . GERD (gastroesophageal reflux disease)   . Heart murmur   . Hematochezia 11/19/2009  . Hyperlipidemia   . Hypertension   . Iron deficiency anemia 11/19/2009  . Osteoarthritis    left knee  . 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 Sleep apnea    study 20 yrs ago. no cpap used now    Past Surgical History:  Procedure Laterality Date  . CARDIAC CATHETERIZATION  08/1999   which revealed a mild coronary artery disease with 60 and 70 and 80% stenosis in a small first diagonal vessel at the LAD, 50% mid LAD stenosis, 50% ostial left circumflrx stenosis, and 40% ostial and proximal intermediate stenosis. he had 10 to 20%  irregularities of his mid right coronary artery.  Marland Kitchen CIRCUMCISION  07/17/2012   Procedure: CIRCUMCISION ADULT;  Surgeon: Dutch Gray, MD;  [facility];  Service: Urology;  Laterality: N/A;  . COLONOSCOPY  02/14/06   M JENKINS  . COLONOSCOPY N/A 10/28/2014   Procedure: COLONOSCOPY;  Surgeon: Rogene Houston, MD;  [facility];  Service: Endoscopy;  Laterality: N/A;  730  . ENTEROSCOPY N/A 10/04/2014   Procedure: ENTEROSCOPY;  Surgeon: Carol Ada, MD;  Location: [facility] ENDOSCOPY;  Service: Endoscopy;  Laterality: N/A;  . ERCP  12/25/1996   ROURK  . GIVENS CAPSULE STUDY N/A  10/04/2014   Procedure: GIVENS CAPSULE STUDY;  Surgeon: Carol Ada, MD;  Location: [place];  Service: Endoscopy;  Laterality: N/A;  . JOINT REPLACEMENT  07-12-12   2008/2007 -knee replacements  . SMALL BOWEL GIVENS  11/25/2008  . UPPER GASTROINTESTINAL ENDOSCOPY  11/08/2008   EGD TCS    Allergies  Allergen Reactions  . Morphine And Related Itching    Current Outpatient Prescriptions  Medication Sig Dispense Refill  . aspirin 81 MG tablet Take 1 tablet (81 mg total) by mouth daily. HOLD FOR 1 WEEK, THEN RESUME IF NO BLEEDING PROBLEMS 30 tablet   . atorvastatin (LIPITOR) 40 MG tablet TAKE 1 TABLET EVERY DAY 90 tablet 3  . bisoprolol-hydrochlorothiazide (ZIAC) 2.5-6.25 MG tablet TAKE 1 TABLET EVERY DAY 90 tablet 3  . Canagliflozin (INVOKANA) 300 MG TABS Take 300 mg by mouth every morning.    . clobetasol (TEMOVATE) 0.05 % external solution Use as directed    . ferrous sulfate 325 (65 FE) MG EC tablet Take 1 tablet (325 mg total) by mouth 2 (two) times daily after a meal. (Patient taking differently: Take 325 mg by mouth daily with breakfast. )  3  . finasteride (PROSCAR) 5 MG tablet Take 5 mg by mouth daily.    Marland Kitchen loperamide (IMODIUM) 2 MG capsule Take 1 capsule (2 mg total) by mouth daily before breakfast. 1 capsule 5  . metFORMIN (GLUMETZA) 1000 MG (MOD) 24 hr tablet Take 1,000 mg by mouth 2 (two) times daily.     . pantoprazole (PROTONIX) 40 MG tablet TAKE 1 TABLET EVERY DAY 90 tablet 3  . ramipril (ALTACE) 10 MG capsule TAKE 1 CAPSULE EVERY DAY 90 capsule 3  . tamsulosin (FLOMAX) 0.4 MG CAPS capsule Take 0.4 mg by mouth.    . triamcinolone cream (KENALOG) 0.1 % Use as directed     No current facility-administered medications for this visit.    Social he is married and has 2 children 3 grandchildren. 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 There is a remote tobacco history having quit over 10 years ago. His alcohol use.  ROS General: Negative; No fevers, chills, or night  sweats;  HEENT: Negative; No changes in vision or hearing, sinus congestion, difficulty swallowing Pulmonary: Negative; No cough, wheezing, shortness of breath, hemoptysis Cardiovascular: Negative; No chest pain, presyncope, syncope, palpatations GI: Positive for GERD No nausea, vomiting, diarrhea, or abdominal pain GU: Negative; No dysuria, hematuria, or difficulty voiding Musculoskeletal: Negative; no myalgias, joint pain, or weakness Hematologic/Oncology: Negative; no easy bruising, bleeding Endocrine: Positive for diabetes; no heat/cold intolerance;  Neuro: Negative; no changes in balance, headaches Skin: Negative; No rashes or skin lesions Psychiatric: Negative; No behavioral problems, depression Sleep: Negative; No snoring, daytime sleepiness, hypersomnolence, bruxism, restless legs, hypnogognic hallucinations, no cataplexy Other comprehensive 14 point system review is negative.   PE BP 104/64 (BP Location: Left  Arm, Patient Position: Sitting, Cuff Size: Normal)   Pulse 62   Ht '5\' 6"'  (1.676 m)   Wt 175 lb (79.4 kg)   BMI 28.25 kg/m    Repeat 120/70  Wt Readings from Last 3 Encounters:  03/03/16 175 lb (79.4 kg)  02/26/16 176 lb 8 oz (80.1 kg)  08/26/15 179 lb (81.2 kg)   General: Alert, oriented, no distress.  Skin: normal turgor, no rashes HEENT: Normocephalic, atraumatic. Pupils round and reactive; sclera anicteric;no lid lag. Nose without nasal septal hypertrophy Mouth/Parynx benign; Mallinpatti scale 2 Neck: No JVD, no carotid bruits with normal carotid upstroke Lungs: clear to ausculatation and percussion; no wheezing or rales Chest wall: Nontender to palpation Heart: RRR, s1 s2 normal 1/6 systolic murmur.  No diastolic murmur.  No S3 or S4 gallop.  No rubs, thrills or heaves. Abdomen: Mild central adiposity. soft, nontender; no hepatosplenomehaly, BS+; abdominal aorta nontender and not dilated by palpation. Back: No CVA tenderness Pulses 2+ Extremities: no  clubbing cyanosis or edema, Homan's sign negative  Neurologic: grossly nonfocal Psychological: Normal affect and mood  ECG (independently read by me): Normal sinus rhythm at 62 bpm.  No ectopy.  Normal intervals.  No ST segment changes.  July 2016 ECG (independently read by me):  Sinus bradycardia 55 bpm.  No ectopy.  Normal intervals.   June 2015 ECG (independently read by me): Sinus rhythm 57 beats per minute.  Normal intervals.  No ST changes.  ECG: Sinus rhythm at 59 beats per minute.  LABS: BMP Latest Ref Rng & Units 10/03/2014 07/12/2012 08/24/2010  Glucose 70 - 99 mg/dL 134(H) 127(H) -  BUN 6 - 23 mg/dL 10 14 -  Creatinine 0.50 - 1.35 mg/dL 0.83 0.78 0.8  Sodium 135 - 145 mmol/L 137 139 142  Potassium 3.5 - 5.1 mmol/L 4.1 4.4 4.3  Chloride 96 - 112 mmol/L 101 101 -  CO2 19 - 32 mmol/L 26 28 -  Calcium 8.4 - 10.5 mg/dL 9.2 10.3 -   Hepatic Function Latest Ref Rng & Units 10/03/2014 08/24/2010 06/26/2007  Total Protein 6.0 - 8.3 g/dL 6.9 - 7.2  Albumin 3.5 - 5.2 g/dL 4.0 - 4.1  AST 0 - 37 U/L '20 20 29  ' ALT 0 - 53 U/L '13 20 28  ' Alk Phosphatase 39 - 117 U/L 62 - 77  Total Bilirubin 0.3 - 1.2 mg/dL 0.7 - 0.5   CBC Latest Ref Rng & Units 10/28/2014 10/05/2014 10/04/2014  WBC 4.0 - 10.5 K/uL - - 6.5  Hemoglobin 13.0 - 17.0 g/dL 10.7(L) 9.0(L) 8.6(L)  Hematocrit 39.0 - 52.0 % 37.6(L) 30.8(L) 28.8(L)  Platelets 150 - 400 K/uL - - 264   Lab Results  Component Value Date   MCV 67.8 (L) 10/04/2014   MCV 63.3 (L) 10/04/2014   MCV 59.4 (L) 10/03/2014   No results found for: TSH   No results found for: HGBA1C   Lipid Panel  No results found for: CHOL, TRIG, HDL, CHOLHDL, VLDL, LDLCALC, LDLDIRECT    ASSESSMENT AND PLAN: Luke [name] is a 65 year old [place] male who is 16 years status post cardiac catheterization which demonstrated mild/moderate CAD.  He has been on medical therapy and has been without recurrent symptomatology.  His last nuclear perfusion study in April 2012 showed  normal perfusion.  He has a history of hypertension.  There also is a history of chronic iron deficiency anemia.  He was  hospitalized last year with profound anemia and required 4 units  of blood cell transfusion.  He states he underwent third GI evaluation but was also recommended that he possibly seek further evaluation of the small intestines at [facility].  He tells me [facility]  Times he still passes some blood in stool, but his hemoglobin have has stabilized.  He denies any anginal symptoms.  He denies PND, orthopnea.  His ECG remained stable with sinus rhythm at 92 bpm.  I am recommending repeat blood work be obtained including before meals met, TSH, lipid panel and hemoglobin A1c.  He just had a CBC done on 02/26/2016 which showed a hemoglobin of 13.3 and hematocrit of 39.8.  He also tells me he had a another hemoglobin test drawn yesterday and his hemoglobin was now 14.4.  I will contact him regarding his blood work.  He continues to be on atorvastatin 40 mg for hyperlipidemia and is tolerating this.  Target LDL is less than 70.  His blood pressure today is controlled on ramipril and bisoprolol HCT.  As long as he remains cardiac stable I'll see him in one year for reevaluation.  Time spent: 25 minutes  Troy Sine, MD, [facility]  03/05/2016 1:27 PM

## 2016-03-06 LAB — LIPID PANEL W/O CHOL/HDL RATIO
Cholesterol, Total: 149 mg/dL (ref 100–199)
HDL: 36 mg/dL — AB (ref >39)
LDL CALC: 86 mg/dL (ref 0–99)
TRIGLYCERIDES: 133 mg/dL (ref 0–149)
VLDL CHOLESTEROL CAL: 27 mg/dL (ref 5–40)

## 2016-03-06 LAB — COMPREHENSIVE METABOLIC PANEL
ALT: 22 IU/L (ref 0–44)
AST: 21 IU/L (ref 0–40)
Albumin/Globulin Ratio: 1.8 (ref 1.2–2.2)
Albumin: 4.5 g/dL (ref 3.6–4.8)
Alkaline Phosphatase: 91 IU/L (ref 39–117)
BUN/Creatinine Ratio: 13 (ref 10–24)
BUN: 11 mg/dL (ref 8–27)
Bilirubin Total: 0.3 mg/dL (ref 0.0–1.2)
CALCIUM: 10.1 mg/dL (ref 8.6–10.2)
CO2: 24 mmol/L (ref 18–29)
CREATININE: 0.86 mg/dL (ref 0.76–1.27)
Chloride: 95 mmol/L — ABNORMAL LOW (ref 96–106)
GFR calc Af Amer: 104 mL/min/{1.73_m2} (ref >59)
GFR, EST NON AFRICAN AMERICAN: 90 mL/min/{1.73_m2} (ref >59)
GLOBULIN, TOTAL: 2.5 g/dL (ref 1.5–4.5)
Glucose: 203 mg/dL — ABNORMAL HIGH (ref 65–99)
Potassium: 5.1 mmol/L (ref 3.5–5.2)
SODIUM: 139 mmol/L (ref 134–144)
Total Protein: 7 g/dL (ref 6.0–8.5)

## 2016-03-06 LAB — TSH: TSH: 4.41 u[IU]/mL (ref 0.450–4.500)

## 2016-03-06 LAB — HGB A1C W/O EAG: HEMOGLOBIN A1C: 9 % — AB (ref 4.8–5.6)

## 2016-03-16 DIAGNOSIS — H2513 Age-related nuclear cataract, bilateral: Secondary | ICD-10-CM | POA: Diagnosis not present

## 2016-03-16 DIAGNOSIS — E119 Type 2 diabetes mellitus without complications: Secondary | ICD-10-CM | POA: Diagnosis not present

## 2016-04-01 ENCOUNTER — Telehealth: Payer: Self-pay | Admitting: *Deleted

## 2016-04-01 NOTE — Telephone Encounter (Signed)
Informed patient's wife of lab results and recommendations. Copy faxed via EPIC to Dr Gerarda Fraction.

## 2016-04-01 NOTE — Telephone Encounter (Signed)
-----   Message from Troy Sine, MD sent at 03/28/2016 11:43 AM EDT ----- Needs improved glucose control; HbA1c 9; send results to PCP

## 2016-05-10 ENCOUNTER — Other Ambulatory Visit (INDEPENDENT_AMBULATORY_CARE_PROVIDER_SITE_OTHER): Payer: Self-pay | Admitting: *Deleted

## 2016-05-10 ENCOUNTER — Encounter (INDEPENDENT_AMBULATORY_CARE_PROVIDER_SITE_OTHER): Payer: Self-pay | Admitting: *Deleted

## 2016-05-10 DIAGNOSIS — D509 Iron deficiency anemia, unspecified: Secondary | ICD-10-CM

## 2016-05-17 DIAGNOSIS — Z23 Encounter for immunization: Secondary | ICD-10-CM | POA: Diagnosis not present

## 2016-05-17 DIAGNOSIS — Z1389 Encounter for screening for other disorder: Secondary | ICD-10-CM | POA: Diagnosis not present

## 2016-05-17 DIAGNOSIS — E782 Mixed hyperlipidemia: Secondary | ICD-10-CM | POA: Diagnosis not present

## 2016-05-17 DIAGNOSIS — Z6828 Body mass index (BMI) 28.0-28.9, adult: Secondary | ICD-10-CM | POA: Diagnosis not present

## 2016-05-17 DIAGNOSIS — E1165 Type 2 diabetes mellitus with hyperglycemia: Secondary | ICD-10-CM | POA: Diagnosis not present

## 2016-05-17 DIAGNOSIS — E663 Overweight: Secondary | ICD-10-CM | POA: Diagnosis not present

## 2016-05-17 DIAGNOSIS — I10 Essential (primary) hypertension: Secondary | ICD-10-CM | POA: Diagnosis not present

## 2016-05-17 DIAGNOSIS — E669 Obesity, unspecified: Secondary | ICD-10-CM | POA: Diagnosis not present

## 2016-05-18 DIAGNOSIS — M7740 Metatarsalgia, unspecified foot: Secondary | ICD-10-CM | POA: Diagnosis not present

## 2016-05-18 DIAGNOSIS — L851 Acquired keratosis [keratoderma] palmaris et plantaris: Secondary | ICD-10-CM | POA: Diagnosis not present

## 2016-05-18 DIAGNOSIS — M79673 Pain in unspecified foot: Secondary | ICD-10-CM | POA: Diagnosis not present

## 2016-05-18 DIAGNOSIS — E1151 Type 2 diabetes mellitus with diabetic peripheral angiopathy without gangrene: Secondary | ICD-10-CM | POA: Diagnosis not present

## 2016-05-28 DIAGNOSIS — E119 Type 2 diabetes mellitus without complications: Secondary | ICD-10-CM | POA: Diagnosis not present

## 2016-05-29 DIAGNOSIS — E119 Type 2 diabetes mellitus without complications: Secondary | ICD-10-CM | POA: Diagnosis not present

## 2016-05-31 DIAGNOSIS — D509 Iron deficiency anemia, unspecified: Secondary | ICD-10-CM | POA: Diagnosis not present

## 2016-05-31 LAB — CBC
HEMATOCRIT: 38.9 % (ref 38.5–50.0)
Hemoglobin: 12.5 g/dL — ABNORMAL LOW (ref 13.2–17.1)
MCH: 27.4 pg (ref 27.0–33.0)
MCHC: 32.1 g/dL (ref 32.0–36.0)
MCV: 85.1 fL (ref 80.0–100.0)
MPV: 9.9 fL (ref 7.5–12.5)
PLATELETS: 262 10*3/uL (ref 140–400)
RBC: 4.57 MIL/uL (ref 4.20–5.80)
RDW: 16.1 % — AB (ref 11.0–15.0)
WBC: 7.2 10*3/uL (ref 3.8–10.8)

## 2016-07-21 DIAGNOSIS — N401 Enlarged prostate with lower urinary tract symptoms: Secondary | ICD-10-CM | POA: Diagnosis not present

## 2016-07-26 DIAGNOSIS — M79673 Pain in unspecified foot: Secondary | ICD-10-CM | POA: Diagnosis not present

## 2016-07-26 DIAGNOSIS — B351 Tinea unguium: Secondary | ICD-10-CM | POA: Diagnosis not present

## 2016-07-26 DIAGNOSIS — L851 Acquired keratosis [keratoderma] palmaris et plantaris: Secondary | ICD-10-CM | POA: Diagnosis not present

## 2016-07-28 DIAGNOSIS — R3912 Poor urinary stream: Secondary | ICD-10-CM | POA: Diagnosis not present

## 2016-07-28 DIAGNOSIS — N401 Enlarged prostate with lower urinary tract symptoms: Secondary | ICD-10-CM | POA: Diagnosis not present

## 2016-08-25 ENCOUNTER — Ambulatory Visit (INDEPENDENT_AMBULATORY_CARE_PROVIDER_SITE_OTHER): Payer: Medicare Other | Admitting: Internal Medicine

## 2016-08-25 ENCOUNTER — Encounter (INDEPENDENT_AMBULATORY_CARE_PROVIDER_SITE_OTHER): Payer: Self-pay | Admitting: Internal Medicine

## 2016-08-25 ENCOUNTER — Encounter (INDEPENDENT_AMBULATORY_CARE_PROVIDER_SITE_OTHER): Payer: Self-pay

## 2016-08-25 VITALS — BP 142/80 | HR 72 | Temp 97.6°F | Ht 66.0 in | Wt 171.4 lb

## 2016-08-25 DIAGNOSIS — D508 Other iron deficiency anemias: Secondary | ICD-10-CM | POA: Diagnosis not present

## 2016-08-25 NOTE — Progress Notes (Signed)
Subjective:    Patient ID: Luke [name], male    DOB: [birth year], 66 y.o.   MRN: [id number] Weight in September 176 HPI Here today for f/u of his IDA. Last seen in September of 2017.  Hemoglobin in December 12.5. He tells me he is doing well. Sometimes he has severe gas pain at night. When he has the pain , he will have a BM.  Appetite is good. Has a BM daily. Stools are light brown. No melena or BRRB Continues to work part time at [employer].     Admitted to [facility] in April of 2016 with a hemoglobin of 5.5.  Hx of melena. 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 Benson Norway. Small bowel Given's capsule study was negative. Colonoscopy in May of 2016 without any significant findings.        5/9/2016Colonoscopy  Indications:Patient is 66 year old Caucasian male with history of iron deficiency anemia with workup in 2010. He was seen by me in April 2015 and his hemoglobin was 14 g. Stool was guaiac negative. By mouth iron was discontinued. He now presents with few weeks history of melena and noted to have hemoglobin of 5.5 g. 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 Carol Ada. These lesions are felt not to be the source of bleeding. He was advised outpatient col Impression:  Normal mucosa of terminal ileum. Focal edema/thickening to mucosal fold at sigmoid colon. Biopsy taken.  Biopsy from: Is negative for adenoma. Results reviewed with patient.      CBC    Component Value Date/Time   WBC 7.2 05/31/2016 0726   RBC 4.57 05/31/2016 0726   HGB 12.5 (L) 05/31/2016 0726   HCT 38.9 05/31/2016 0726   PLT 262 05/31/2016 0726   MCV 85.1 05/31/2016 0726   MCH 27.4 05/31/2016 0726   MCHC 32.1 05/31/2016 0726   RDW 16.1 (H) 05/31/2016 0726   LYMPHSABS 1.0 10/03/2014 1010   MONOABS 0.7 10/03/2014 1010   EOSABS 0.1 10/03/2014 1010   BASOSABS 0.1 10/03/2014 1010       Review of Systems Past Medical History:  Diagnosis Date  . CAD (coronary artery disease) 10/02/2010   nuclear study show normal perfusion on medical therapy without scar or ischemia. EF 64%  . Diabetes mellitus ([place])    type 2   . GERD (gastroesophageal reflux disease)   . Heart murmur   . Hematochezia 11/19/2009  . Hyperlipidemia   . Hypertension   . Iron deficiency anemia 11/19/2009  . Osteoarthritis    left knee  . 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 Sleep apnea    study 20 yrs ago. no cpap used now    Past Surgical History:  Procedure Laterality Date  . CARDIAC CATHETERIZATION  08/1999   which revealed a mild coronary artery disease with 60 and 70 and 80% stenosis in a small first diagonal vessel at the LAD, 50% mid LAD stenosis, 50% ostial left circumflrx stenosis, and 40% ostial and proximal intermediate stenosis. he had 10 to 20% irregularities of his mid right coronary artery.  Marland Kitchen CIRCUMCISION  07/17/2012   Procedure: CIRCUMCISION ADULT;  Surgeon: Dutch Gray, MD;  Location: [facility];  Service: Urology;  Laterality: N/A;  . COLONOSCOPY  02/14/06   M JENKINS  . COLONOSCOPY N/A 10/28/2014   Procedure: COLONOSCOPY;  Surgeon: Rogene Houston, MD;  [facility];  Service: Endoscopy;  Laterality: N/A;  730  . ENTEROSCOPY N/A 10/04/2014   Procedure: ENTEROSCOPY;  Surgeon: Carol Ada, MD;  Location: [facility] ENDOSCOPY;  Service: Endoscopy;  Laterality: N/A;  . ERCP  12/25/1996   ROURK  . GIVENS CAPSULE STUDY N/A 10/04/2014   Procedure: GIVENS CAPSULE STUDY;  Surgeon: Carol Ada, MD;  Location: [place];  Service: Endoscopy;  Laterality: N/A;  . JOINT REPLACEMENT  07-12-12   2008/2007 -knee replacements  . SMALL BOWEL GIVENS  11/25/2008  . UPPER GASTROINTESTINAL ENDOSCOPY  11/08/2008   EGD TCS    Allergies  Allergen Reactions  . Morphine And Related Itching    Current Outpatient Prescriptions on File Prior to Visit    Medication Sig Dispense Refill  . aspirin 81 MG tablet Take 1 tablet (81 mg total) by mouth daily. HOLD FOR 1 WEEK, THEN RESUME IF NO BLEEDING PROBLEMS 30 tablet   . atorvastatin (LIPITOR) 40 MG tablet TAKE 1 TABLET EVERY DAY 90 tablet 3  . bisoprolol-hydrochlorothiazide (ZIAC) 2.5-6.25 MG tablet TAKE 1 TABLET EVERY DAY 90 tablet 3  . Canagliflozin (INVOKANA) 300 MG TABS Take 300 mg by mouth every morning.    . clobetasol (TEMOVATE) 0.05 % external solution Use as directed    . ferrous sulfate 325 (65 FE) MG EC tablet Take 1 tablet (325 mg total) by mouth 2 (two) times daily after a meal. (Patient taking differently: Take 325 mg by mouth daily with breakfast. Once a day)  3  . finasteride (PROSCAR) 5 MG tablet Take 5 mg by mouth daily.    Marland Kitchen loperamide (IMODIUM) 2 MG capsule Take 1 capsule (2 mg total) by mouth daily before breakfast. 1 capsule 5  . metFORMIN (GLUMETZA) 1000 MG (MOD) 24 hr tablet Take 1,000 mg by mouth 2 (two) times daily.     . pantoprazole (PROTONIX) 40 MG tablet TAKE 1 TABLET EVERY DAY 90 tablet 3  . ramipril (ALTACE) 10 MG capsule TAKE 1 CAPSULE EVERY DAY 90 capsule 3  . tamsulosin (FLOMAX) 0.4 MG CAPS capsule Take 0.4 mg by mouth.    . triamcinolone cream (KENALOG) 0.1 % Use as directed     No current facility-administered medications on file prior to visit.        Objective:   Physical Exam Blood pressure (!) 142/80, pulse 72, temperature 97.6 F (36.4 C), height 5\' 6"  (1.676 m), weight 171 lb 6.4 oz (77.7 kg).  Alert and oriented. Skin warm and dry. Oral mucosa is moist.   . Sclera anicteric, conjunctivae is pink. Thyroid not enlarged. No cervical lymphadenopathy. Lungs clear. Heart regular rate and rhythm.  Abdomen is soft. Bowel sounds are positive. No hepatomegaly. No abdominal masses felt. No tenderness.  No edema to lower extremities.        Assessment & Plan:  IDA. He is doing well.  Hemoglobin is stable. No melena. OV in 6 months with a CBC.

## 2016-08-25 NOTE — Patient Instructions (Signed)
OV in 6 months. 

## 2016-09-10 DIAGNOSIS — E1165 Type 2 diabetes mellitus with hyperglycemia: Secondary | ICD-10-CM | POA: Diagnosis not present

## 2016-09-10 DIAGNOSIS — Z6828 Body mass index (BMI) 28.0-28.9, adult: Secondary | ICD-10-CM | POA: Diagnosis not present

## 2016-09-10 DIAGNOSIS — N400 Enlarged prostate without lower urinary tract symptoms: Secondary | ICD-10-CM | POA: Diagnosis not present

## 2016-09-10 DIAGNOSIS — I2510 Atherosclerotic heart disease of native coronary artery without angina pectoris: Secondary | ICD-10-CM | POA: Diagnosis not present

## 2016-09-10 DIAGNOSIS — E782 Mixed hyperlipidemia: Secondary | ICD-10-CM | POA: Diagnosis not present

## 2016-09-10 DIAGNOSIS — I10 Essential (primary) hypertension: Secondary | ICD-10-CM | POA: Diagnosis not present

## 2016-09-10 DIAGNOSIS — D509 Iron deficiency anemia, unspecified: Secondary | ICD-10-CM | POA: Diagnosis not present

## 2016-09-29 DIAGNOSIS — E1165 Type 2 diabetes mellitus with hyperglycemia: Secondary | ICD-10-CM | POA: Diagnosis not present

## 2016-09-29 DIAGNOSIS — I10 Essential (primary) hypertension: Secondary | ICD-10-CM | POA: Diagnosis not present

## 2016-09-29 DIAGNOSIS — N400 Enlarged prostate without lower urinary tract symptoms: Secondary | ICD-10-CM | POA: Diagnosis not present

## 2016-09-30 DIAGNOSIS — E119 Type 2 diabetes mellitus without complications: Secondary | ICD-10-CM | POA: Diagnosis not present

## 2016-09-30 DIAGNOSIS — H2513 Age-related nuclear cataract, bilateral: Secondary | ICD-10-CM | POA: Diagnosis not present

## 2016-10-01 DIAGNOSIS — Z96653 Presence of artificial knee joint, bilateral: Secondary | ICD-10-CM | POA: Diagnosis not present

## 2016-10-01 DIAGNOSIS — N400 Enlarged prostate without lower urinary tract symptoms: Secondary | ICD-10-CM | POA: Diagnosis not present

## 2016-10-01 DIAGNOSIS — E1165 Type 2 diabetes mellitus with hyperglycemia: Secondary | ICD-10-CM | POA: Diagnosis not present

## 2016-10-01 DIAGNOSIS — I10 Essential (primary) hypertension: Secondary | ICD-10-CM | POA: Diagnosis not present

## 2016-10-01 DIAGNOSIS — E782 Mixed hyperlipidemia: Secondary | ICD-10-CM | POA: Diagnosis not present

## 2016-10-01 DIAGNOSIS — I2510 Atherosclerotic heart disease of native coronary artery without angina pectoris: Secondary | ICD-10-CM | POA: Diagnosis not present

## 2016-10-01 DIAGNOSIS — D509 Iron deficiency anemia, unspecified: Secondary | ICD-10-CM | POA: Diagnosis not present

## 2016-10-01 DIAGNOSIS — Z6828 Body mass index (BMI) 28.0-28.9, adult: Secondary | ICD-10-CM | POA: Diagnosis not present

## 2016-10-04 DIAGNOSIS — B351 Tinea unguium: Secondary | ICD-10-CM | POA: Diagnosis not present

## 2016-10-04 DIAGNOSIS — E1151 Type 2 diabetes mellitus with diabetic peripheral angiopathy without gangrene: Secondary | ICD-10-CM | POA: Diagnosis not present

## 2016-10-04 DIAGNOSIS — M79674 Pain in right toe(s): Secondary | ICD-10-CM | POA: Diagnosis not present

## 2016-10-04 DIAGNOSIS — L851 Acquired keratosis [keratoderma] palmaris et plantaris: Secondary | ICD-10-CM | POA: Diagnosis not present

## 2016-10-04 DIAGNOSIS — M79675 Pain in left toe(s): Secondary | ICD-10-CM | POA: Diagnosis not present

## 2016-12-13 DIAGNOSIS — M79674 Pain in right toe(s): Secondary | ICD-10-CM | POA: Diagnosis not present

## 2016-12-13 DIAGNOSIS — M79675 Pain in left toe(s): Secondary | ICD-10-CM | POA: Diagnosis not present

## 2016-12-13 DIAGNOSIS — L851 Acquired keratosis [keratoderma] palmaris et plantaris: Secondary | ICD-10-CM | POA: Diagnosis not present

## 2016-12-13 DIAGNOSIS — B351 Tinea unguium: Secondary | ICD-10-CM | POA: Diagnosis not present

## 2016-12-13 DIAGNOSIS — E1151 Type 2 diabetes mellitus with diabetic peripheral angiopathy without gangrene: Secondary | ICD-10-CM | POA: Diagnosis not present

## 2017-01-10 DIAGNOSIS — Z1159 Encounter for screening for other viral diseases: Secondary | ICD-10-CM | POA: Diagnosis not present

## 2017-01-10 DIAGNOSIS — I10 Essential (primary) hypertension: Secondary | ICD-10-CM | POA: Diagnosis not present

## 2017-01-10 DIAGNOSIS — E1165 Type 2 diabetes mellitus with hyperglycemia: Secondary | ICD-10-CM | POA: Diagnosis not present

## 2017-01-14 DIAGNOSIS — I10 Essential (primary) hypertension: Secondary | ICD-10-CM | POA: Diagnosis not present

## 2017-01-14 DIAGNOSIS — N400 Enlarged prostate without lower urinary tract symptoms: Secondary | ICD-10-CM | POA: Diagnosis not present

## 2017-01-14 DIAGNOSIS — Z6827 Body mass index (BMI) 27.0-27.9, adult: Secondary | ICD-10-CM | POA: Diagnosis not present

## 2017-01-14 DIAGNOSIS — Z862 Personal history of diseases of the blood and blood-forming organs and certain disorders involving the immune mechanism: Secondary | ICD-10-CM | POA: Diagnosis not present

## 2017-01-14 DIAGNOSIS — I2510 Atherosclerotic heart disease of native coronary artery without angina pectoris: Secondary | ICD-10-CM | POA: Diagnosis not present

## 2017-01-14 DIAGNOSIS — Z96653 Presence of artificial knee joint, bilateral: Secondary | ICD-10-CM | POA: Diagnosis not present

## 2017-01-14 DIAGNOSIS — E1165 Type 2 diabetes mellitus with hyperglycemia: Secondary | ICD-10-CM | POA: Diagnosis not present

## 2017-01-14 DIAGNOSIS — E782 Mixed hyperlipidemia: Secondary | ICD-10-CM | POA: Diagnosis not present

## 2017-02-09 DIAGNOSIS — I2510 Atherosclerotic heart disease of native coronary artery without angina pectoris: Secondary | ICD-10-CM | POA: Diagnosis not present

## 2017-02-09 DIAGNOSIS — E1165 Type 2 diabetes mellitus with hyperglycemia: Secondary | ICD-10-CM | POA: Diagnosis not present

## 2017-02-09 DIAGNOSIS — I10 Essential (primary) hypertension: Secondary | ICD-10-CM | POA: Diagnosis not present

## 2017-02-09 DIAGNOSIS — N400 Enlarged prostate without lower urinary tract symptoms: Secondary | ICD-10-CM | POA: Diagnosis not present

## 2017-02-09 DIAGNOSIS — Z96653 Presence of artificial knee joint, bilateral: Secondary | ICD-10-CM | POA: Diagnosis not present

## 2017-02-09 DIAGNOSIS — Z6827 Body mass index (BMI) 27.0-27.9, adult: Secondary | ICD-10-CM | POA: Diagnosis not present

## 2017-02-12 DIAGNOSIS — L308 Other specified dermatitis: Secondary | ICD-10-CM | POA: Diagnosis not present

## 2017-02-12 DIAGNOSIS — S30860A Insect bite (nonvenomous) of lower back and pelvis, initial encounter: Secondary | ICD-10-CM | POA: Diagnosis not present

## 2017-02-23 DIAGNOSIS — E782 Mixed hyperlipidemia: Secondary | ICD-10-CM | POA: Diagnosis not present

## 2017-02-23 DIAGNOSIS — I2510 Atherosclerotic heart disease of native coronary artery without angina pectoris: Secondary | ICD-10-CM | POA: Diagnosis not present

## 2017-02-23 DIAGNOSIS — D509 Iron deficiency anemia, unspecified: Secondary | ICD-10-CM | POA: Diagnosis not present

## 2017-02-23 DIAGNOSIS — E1165 Type 2 diabetes mellitus with hyperglycemia: Secondary | ICD-10-CM | POA: Diagnosis not present

## 2017-02-23 DIAGNOSIS — N400 Enlarged prostate without lower urinary tract symptoms: Secondary | ICD-10-CM | POA: Diagnosis not present

## 2017-02-23 DIAGNOSIS — Z6827 Body mass index (BMI) 27.0-27.9, adult: Secondary | ICD-10-CM | POA: Diagnosis not present

## 2017-02-23 DIAGNOSIS — Z96653 Presence of artificial knee joint, bilateral: Secondary | ICD-10-CM | POA: Diagnosis not present

## 2017-02-23 DIAGNOSIS — I10 Essential (primary) hypertension: Secondary | ICD-10-CM | POA: Diagnosis not present

## 2017-02-25 ENCOUNTER — Ambulatory Visit (INDEPENDENT_AMBULATORY_CARE_PROVIDER_SITE_OTHER): Payer: Medicare Other | Admitting: Internal Medicine

## 2017-02-25 ENCOUNTER — Encounter (INDEPENDENT_AMBULATORY_CARE_PROVIDER_SITE_OTHER): Payer: Self-pay

## 2017-02-25 ENCOUNTER — Encounter (INDEPENDENT_AMBULATORY_CARE_PROVIDER_SITE_OTHER): Payer: Self-pay | Admitting: Internal Medicine

## 2017-02-25 VITALS — BP 160/80 | HR 72 | Temp 97.4°F | Ht 65.0 in | Wt 168.9 lb

## 2017-02-25 DIAGNOSIS — D500 Iron deficiency anemia secondary to blood loss (chronic): Secondary | ICD-10-CM

## 2017-02-25 NOTE — Progress Notes (Signed)
   Subjective:    Patient ID: Luke [name], male    DOB: 06-01-1951, 66 y.o.   MRN: [id number]  HPI Here today for f/u. Last seen in March of this year. Hx of iron deficiency anemia.  Hx of melena.  Admitted to [facility] in April of 2016 with a hemoglobin of 5.5. Hx of melena. 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 Benson Norway. Small bowel Given's capsule study was negative. Colonoscopy in May of 2016 without any significant findings.  He tells me he is doing good. He is working part time. No rectal bleeding. He is exercising. He is trying to lose weight. He is eating healthy.  01/10/2017 Hemoglobin 12.9   5/9/2016Colonoscopy  Indications:Patient is 66 year old Caucasian male with history of iron deficiency anemia with workup in 2010. He was seen by me in April 2015 and his hemoglobin was 14 g. Stool was guaiac negative. By mouth iron was discontinued. He now presents with few weeks history of melena and noted to have hemoglobin of 5.5 g. 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 Carol Ada. These lesions are felt not to be the source of bleeding.  Impression:   Normal mucosa of terminal ileum. Focal edema/thickening to mucosal fold at sigmoid colon. Biopsy taken.  Biopsy from: Is negative for adenoma. Results reviewed with patient.    Review of Systems [name],Luke [name]    Objective:   Physical Exam Blood pressure (!) 160/80, pulse 72, temperature (!) 97.4 F (36.3 C), height 5\' 5"  (1.651 m), weight 168 lb 14.4 oz (76.6 kg). Alert and oriented. Skin warm and dry. Oral mucosa is moist.   . Sclera anicteric, conjunctivae is pink. Thyroid not enlarged. No cervical lymphadenopathy. Lungs clear. Heart regular rate and rhythm.  Abdomen is soft. Bowel sounds are positive. No hepatomegaly. No abdominal masses felt. No tenderness.  No edema to  lower extremities.          Assessment & Plan:  IDA. He is doing well. Hemoglobin has remained stable.  No rectal bleeding. Will see back in 6 months.

## 2017-02-25 NOTE — Patient Instructions (Addendum)
OV in 6 months. 

## 2017-03-07 DIAGNOSIS — M79675 Pain in left toe(s): Secondary | ICD-10-CM | POA: Diagnosis not present

## 2017-03-07 DIAGNOSIS — L851 Acquired keratosis [keratoderma] palmaris et plantaris: Secondary | ICD-10-CM | POA: Diagnosis not present

## 2017-03-07 DIAGNOSIS — M79674 Pain in right toe(s): Secondary | ICD-10-CM | POA: Diagnosis not present

## 2017-03-07 DIAGNOSIS — E1151 Type 2 diabetes mellitus with diabetic peripheral angiopathy without gangrene: Secondary | ICD-10-CM | POA: Diagnosis not present

## 2017-03-07 DIAGNOSIS — B351 Tinea unguium: Secondary | ICD-10-CM | POA: Diagnosis not present

## 2017-03-29 ENCOUNTER — Ambulatory Visit (INDEPENDENT_AMBULATORY_CARE_PROVIDER_SITE_OTHER): Payer: Medicare Other | Admitting: Cardiovascular Disease

## 2017-03-29 ENCOUNTER — Encounter: Payer: Self-pay | Admitting: Cardiovascular Disease

## 2017-03-29 VITALS — BP 180/84 | HR 72 | Ht 65.0 in | Wt 174.0 lb

## 2017-03-29 DIAGNOSIS — I10 Essential (primary) hypertension: Secondary | ICD-10-CM | POA: Diagnosis not present

## 2017-03-29 DIAGNOSIS — T39395A Adverse effect of other nonsteroidal anti-inflammatory drugs [NSAID], initial encounter: Secondary | ICD-10-CM | POA: Diagnosis not present

## 2017-03-29 DIAGNOSIS — I2510 Atherosclerotic heart disease of native coronary artery without angina pectoris: Secondary | ICD-10-CM

## 2017-03-29 DIAGNOSIS — K922 Gastrointestinal hemorrhage, unspecified: Secondary | ICD-10-CM

## 2017-03-29 DIAGNOSIS — E118 Type 2 diabetes mellitus with unspecified complications: Secondary | ICD-10-CM

## 2017-03-29 DIAGNOSIS — E785 Hyperlipidemia, unspecified: Secondary | ICD-10-CM | POA: Diagnosis not present

## 2017-03-29 MED ORDER — BISOPROLOL-HYDROCHLOROTHIAZIDE 5-6.25 MG PO TABS: 1.0000 | ORAL_TABLET | Freq: Every day | ORAL | 3 refills | Status: DC

## 2017-03-29 NOTE — Patient Instructions (Signed)
Medication Instructions:  INCREASE bisoprolol-hctz to 5-6.25 mg (1 tablet) daily.  Follow-Up: Your physician wants you to follow-up in: 6 MONTHS with Dr. Claiborne Billings.  You will receive a reminder letter in the mail two months in advance. If you don't receive a letter, please call our office to schedule the follow-up appointment.   Any Other Special Instructions Will Be Listed Below (If Applicable).     If you need a refill on your cardiac medications before your next appointment, please call your pharmacy.

## 2017-03-29 NOTE — Progress Notes (Signed)
Patient ID: Luke [name], male   DOB: January 31, 1951, 66 y.o.   MRN: [id number]      HPI: Luke [name] is a 66 y.o. male who presents to the office for a one-year followup evaluation.   [name] has documented mild CAD by cardiac catheterization in 2001 which revealed segmental 60-80% stenoses and a small first diagonal vessel of the LAD, 50% mid LAD stenosis, 50% ostial left circumflex stenosis, and 30% ostial and proximal intermediate stenosis. Mild luminal regularity his of his right coronary artery. He has been on aggressive medical therapy since that time and has remained stable.  Additional problems include mixed hyperlipidemia requiring combination therapy, hypertension, and type 2 diabetes mellitus.  Over the past year, he has continued to do well with reference to chest pain or anginal symptoms.  He has been on Caduet 5/40, tramadol, 10 mg in addition to Ziac 2.5/6.25 mg for blood pressure control.  He now is on multiple drug therapy for his diabetes including invokana, Levemir, metformin, as well as onglyza.   since I last saw him, he was found to develop a profound anemia with a hemoglobin dropped to 5.3 and hematocrit 20.6 in April 2016. MCV was 58.  He was hospitalized and received 4 units of blood transfusion apparently underwent GI evaluation with Dr. Sandi Mealy as well as with Dr. Jearld Adjutant.  He was felt to have acute anemia secondary to iron deficiency and B12 deficiency.  No definitive source of bleeding was demonstrated on his GI studies.  Apparently there was some question of possibly referring [facility] to [facility] for small intestine evaluation  Despite his  profound anemia he did not experience any chest pain , PND or orthopnea.  He had subsequent blood work obtained in [place] hemoglobin to 11.7 and hematocrit 37.3.  MCV had risen to 69.  He was on iron and B12 therapy.  Subsequent lab work by Dr. Jearld Adjutant showed improvement.  Since I last saw him one year ago, he denies any episodes of chest  pain.  He was started on Nolic for his diabetes mellitus.  He continues to walk his dogs.  He is unaware of presyncope or syncope or palpitations.  He presents for evaluation.   Past Medical History:  Diagnosis Date  . CAD (coronary artery disease) 10/02/2010   nuclear study show normal perfusion on medical therapy without scar or ischemia. EF 64%  . Diabetes mellitus ([place])    type 2   . GERD (gastroesophageal reflux disease)   . Heart murmur   . Hematochezia 11/19/2009  . Hyperlipidemia   . Hypertension   . Iron deficiency anemia 11/19/2009  . Osteoarthritis    left knee  . 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 Sleep apnea    study 20 yrs ago. no cpap used now    Past Surgical History:  Procedure Laterality Date  . CARDIAC CATHETERIZATION  08/1999   which revealed a mild coronary artery disease with 60 and 70 and 80% stenosis in a small first diagonal vessel at the LAD, 50% mid LAD stenosis, 50% ostial left circumflrx stenosis, and 40% ostial and proximal intermediate stenosis. he had 10 to 20% irregularities of his mid right coronary artery.  Marland Kitchen CIRCUMCISION  07/17/2012   Procedure: CIRCUMCISION ADULT;  Surgeon: Dutch Gray, MD;  [facility];  Service: Urology;  Laterality: N/A;  . COLONOSCOPY  02/14/06   M JENKINS  . COLONOSCOPY N/A 10/28/2014   Procedure: COLONOSCOPY;  Surgeon: Rogene Houston,  MD;  [facility];  Service: Endoscopy;  Laterality: N/A;  730  . ENTEROSCOPY N/A 10/04/2014   Procedure: ENTEROSCOPY;  Surgeon: Carol Ada, MD;  Location: [facility] ENDOSCOPY;  Service: Endoscopy;  Laterality: N/A;  . ERCP  12/25/1996   ROURK  . GIVENS CAPSULE STUDY N/A 10/04/2014   Procedure: GIVENS CAPSULE STUDY;  Surgeon: Carol Ada, MD;  Location: [place];  Service: Endoscopy;  Laterality: N/A;  . JOINT REPLACEMENT  07-12-12   2008/2007 -knee replacements  . SMALL BOWEL GIVENS  11/25/2008  . UPPER GASTROINTESTINAL ENDOSCOPY  11/08/2008   EGD TCS     Allergies  Allergen Reactions  . Morphine And Related Itching    Current Outpatient Prescriptions  Medication Sig Dispense Refill  . aspirin 81 MG tablet Take 1 tablet (81 mg total) by mouth daily. HOLD FOR 1 WEEK, THEN RESUME IF NO BLEEDING PROBLEMS 30 tablet   . atorvastatin (LIPITOR) 40 MG tablet TAKE 1 TABLET EVERY DAY 90 tablet 3  . clobetasol (TEMOVATE) 0.05 % external solution Use as directed    . ferrous sulfate 325 (65 FE) MG EC tablet Take 1 tablet (325 mg total) by mouth 2 (two) times daily after a meal. (Patient taking differently: Take 325 mg by mouth daily with breakfast. Once a day)  3  . finasteride (PROSCAR) 5 MG tablet Take 5 mg by mouth daily.    . Insulin Degludec (TRESIBA FLEXTOUCH [place]) Inject 10 Units into the skin daily.    . Liraglutide (VICTOZA [place]) Inject 0.6 mg into the skin daily.    Marland Kitchen loperamide (IMODIUM) 2 MG capsule Take 1 capsule (2 mg total) by mouth daily before breakfast. 1 capsule 5  . metFORMIN (GLUMETZA) 1000 MG (MOD) 24 hr tablet Take 1,000 mg by mouth 2 (two) times daily.     . pantoprazole (PROTONIX) 40 MG tablet TAKE 1 TABLET EVERY DAY 90 tablet 3  . ramipril (ALTACE) 10 MG capsule TAKE 1 CAPSULE EVERY DAY 90 capsule 3  . tamsulosin (FLOMAX) 0.4 MG CAPS capsule Take 0.4 mg by mouth.    . bisoprolol-hydrochlorothiazide (ZIAC) 5-6.25 MG tablet Take 1 tablet by mouth daily. 90 tablet 3  . triamcinolone cream (KENALOG) 0.1 % Use as directed     No current facility-administered medications for this visit.    Social he is married and has 2 children 3 grandchildren. 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 There is a remote tobacco history having quit over 10 years ago. His alcohol use.  ROS General: Negative; No fevers, chills, or night sweats;  HEENT: Negative; No changes in vision or hearing, sinus congestion, difficulty swallowing Pulmonary: Negative; No cough, wheezing, shortness of breath, hemoptysis Cardiovascular: Negative; No chest pain,  presyncope, syncope, palpatations GI: Positive for GERD No nausea, vomiting, diarrhea, or abdominal pain GU: Negative; No dysuria, hematuria, or difficulty voiding Musculoskeletal: Negative; no myalgias, joint pain, or weakness Hematologic/Oncology: Negative; no easy bruising, bleeding Endocrine: Positive for diabetes; no heat/cold intolerance;  Neuro: Negative; no changes in balance, headaches Skin: Negative; No rashes or skin lesions Psychiatric: Negative; No behavioral problems, depression Sleep: Negative; No snoring, daytime sleepiness, hypersomnolence, bruxism, restless legs, hypnogognic hallucinations, no cataplexy Other comprehensive 14 point system review is negative.   PE BP (!) 180/84   Pulse 72   Ht '5\' 5"'  (1.651 m)   Wt 174 lb (78.9 kg)   BMI 28.96 kg/m    Repeat blood pressure by me was 164/84 initially and repeat and another time was  176/84.  Wt Readings from Last 3 Encounters:  03/29/17 174 lb (78.9 kg)  02/25/17 168 lb 14.4 oz (76.6 kg)  08/25/16 171 lb 6.4 oz (77.7 kg)   General: Alert, oriented, no distress.  Skin: normal turgor, no rashes, warm and dry HEENT: Normocephalic, atraumatic. Pupils equal round and reactive to light; sclera anicteric; extraocular muscles intact; Nose without nasal septal hypertrophy Mouth/Parynx benign; Mallinpatti scale 3 Neck: No JVD, no carotid bruits; normal carotid upstroke Lungs: clear to ausculatation and percussion; no wheezing or rales Chest wall: without tenderness to palpitation Heart: PMI not displaced, RRR, s1 s2 normal, 1/6 systolic murmur, no diastolic murmur, no rubs, gallops, thrills, or heaves Abdomen: soft, nontender; no hepatosplenomehaly, BS+; abdominal aorta nontender and not dilated by palpation. Back: no CVA tenderness Pulses 2+ Musculoskeletal: full range of motion, normal strength, no joint deformities Extremities: no clubbing cyanosis or edema, Homan's sign negative  Neurologic: grossly nonfocal; Cranial  nerves grossly wnl Psychologic: Normal mood and affect   ECG (independently read by me): Normal sinus rhythm at 72 bpm.  Possible left atrial enlargement.  Normal intervals.  No ST segment changes.  No ectopy.  September 2017 ECG (independently read by me): Normal sinus rhythm at 62 bpm.  No ectopy.  Normal intervals.  No ST segment changes.  July 2016 ECG (independently read by me):  Sinus bradycardia 55 bpm.  No ectopy.  Normal intervals.   June 2015 ECG (independently read by me): Sinus rhythm 57 beats per minute.  Normal intervals.  No ST changes.  ECG: Sinus rhythm at 59 beats per minute.  LABS: BMP Latest Ref Rng & Units 03/05/2016 10/03/2014 07/12/2012  Glucose 65 - 99 mg/dL 203(H) 134(H) 127(H)  BUN 8 - 27 mg/dL '11 10 14  ' Creatinine 0.76 - 1.27 mg/dL 0.86 0.83 0.78  BUN/Creat Ratio 10 - 24 13 - -  Sodium 134 - 144 mmol/L 139 137 139  Potassium 3.5 - 5.2 mmol/L 5.1 4.1 4.4  Chloride 96 - 106 mmol/L 95(L) 101 101  CO2 18 - 29 mmol/L '24 26 28  ' Calcium 8.6 - 10.2 mg/dL 10.1 9.2 10.3   Hepatic Function Latest Ref Rng & Units 03/05/2016 10/03/2014 08/24/2010  Total Protein 6.0 - 8.5 g/dL 7.0 6.9 -  Albumin 3.6 - 4.8 g/dL 4.5 4.0 -  AST 0 - 40 IU/L '21 20 20  ' ALT 0 - 44 IU/L '22 13 20  ' Alk Phosphatase 39 - 117 IU/L 91 62 -  Total Bilirubin 0.0 - 1.2 mg/dL 0.3 0.7 -   CBC Latest Ref Rng & Units 05/31/2016 10/28/2014 10/05/2014  WBC 3.8 - 10.8 K/uL 7.2 - -  Hemoglobin 13.2 - 17.1 g/dL 12.5(L) 10.7(L) 9.0(L)  Hematocrit 38.5 - 50.0 % 38.9 37.6(L) 30.8(L)  Platelets 140 - 400 K/uL 262 - -   Lab Results  Component Value Date   MCV 85.1 05/31/2016   MCV 67.8 (L) 10/04/2014   MCV 63.3 (L) 10/04/2014   Lab Results  Component Value Date   TSH 4.410 03/05/2016     Lab Results  Component Value Date   HGBA1C 9.0 (H) 03/05/2016     Lipid Panel     Component Value Date/Time   CHOL 149 03/05/2016 0830   TRIG 133 03/05/2016 0830   HDL 36 (L) 03/05/2016 0830   LDLCALC 86 03/05/2016  0830   IMPRESSION: 1. CAD in native artery   2. Hyperlipidemia with target LDL less than 70   3. GI bleed due to NSAIDs  4. Type 2 diabetes mellitus with complication, without long-term current use of insulin ([place])   5. Essential hypertension      ASSESSMENT AND PLAN: Mr. Otero is a 66 year old [place] male who is  status post cardiac catheterization in 2001 which demonstrated mild/moderate CAD.  He has been on medical therapy and has been without recurrent symptomatology.  His last nuclear perfusion study in April 2012 showed normal perfusion.  He has a history of hypertension.  There also is a history of chronic iron deficiency anemia.  He was  hospitalized 2 years ago with  profound anemia and required 4 units of blood cell transfusion.  Since I last saw he was started on Treshiba  and Victoza and is no longer taking Invokana.  Is to be on bisoprolol HCT at 2.5/6.25 mg and ramipril 10 mg for hypertension.  His blood pressure is elevated today.  I am titrating his bisoprolol HCT 25/6.25 mg.  He will monitor his blood pressure.  His ECG today reveals sinus rhythm in the 70s without ectopy.  He continues to be on atorvastatin 40 mg for hyperlipidemia with target LDL less than 70.  He continues to be on iron supplementation.  He has been on.  Toprol is all for GERD.  He tells me his primary physician will be checking blood work in one month.  If his blood pressure continues to be elevated, consider adding amlodipine to his medical regimen.  I will see him in 6 months for reevaluation. Time spent: 25 minutes  Troy Sine, MD, [facility]  03/31/2017 5:08 PM

## 2017-04-08 DIAGNOSIS — Z23 Encounter for immunization: Secondary | ICD-10-CM | POA: Diagnosis not present

## 2017-04-13 ENCOUNTER — Other Ambulatory Visit: Payer: Self-pay | Admitting: Cardiovascular Disease

## 2017-05-16 DIAGNOSIS — R1084 Generalized abdominal pain: Secondary | ICD-10-CM | POA: Diagnosis not present

## 2017-05-16 DIAGNOSIS — M79675 Pain in left toe(s): Secondary | ICD-10-CM | POA: Diagnosis not present

## 2017-05-16 DIAGNOSIS — M79674 Pain in right toe(s): Secondary | ICD-10-CM | POA: Diagnosis not present

## 2017-05-16 DIAGNOSIS — L851 Acquired keratosis [keratoderma] palmaris et plantaris: Secondary | ICD-10-CM | POA: Diagnosis not present

## 2017-05-16 DIAGNOSIS — E1151 Type 2 diabetes mellitus with diabetic peripheral angiopathy without gangrene: Secondary | ICD-10-CM | POA: Diagnosis not present

## 2017-05-16 DIAGNOSIS — B351 Tinea unguium: Secondary | ICD-10-CM | POA: Diagnosis not present

## 2017-05-26 ENCOUNTER — Other Ambulatory Visit: Payer: Self-pay | Admitting: Cardiovascular Disease

## 2017-05-26 ENCOUNTER — Other Ambulatory Visit (INDEPENDENT_AMBULATORY_CARE_PROVIDER_SITE_OTHER): Payer: Self-pay | Admitting: Internal Medicine

## 2017-05-27 DIAGNOSIS — R1084 Generalized abdominal pain: Secondary | ICD-10-CM | POA: Diagnosis not present

## 2017-05-27 DIAGNOSIS — I2510 Atherosclerotic heart disease of native coronary artery without angina pectoris: Secondary | ICD-10-CM | POA: Diagnosis not present

## 2017-05-27 DIAGNOSIS — E782 Mixed hyperlipidemia: Secondary | ICD-10-CM | POA: Diagnosis not present

## 2017-05-27 DIAGNOSIS — I10 Essential (primary) hypertension: Secondary | ICD-10-CM | POA: Diagnosis not present

## 2017-05-27 DIAGNOSIS — N400 Enlarged prostate without lower urinary tract symptoms: Secondary | ICD-10-CM | POA: Diagnosis not present

## 2017-05-27 DIAGNOSIS — E1165 Type 2 diabetes mellitus with hyperglycemia: Secondary | ICD-10-CM | POA: Diagnosis not present

## 2017-05-27 DIAGNOSIS — Z6828 Body mass index (BMI) 28.0-28.9, adult: Secondary | ICD-10-CM | POA: Diagnosis not present

## 2017-05-27 DIAGNOSIS — D509 Iron deficiency anemia, unspecified: Secondary | ICD-10-CM | POA: Diagnosis not present

## 2017-05-27 DIAGNOSIS — Z96653 Presence of artificial knee joint, bilateral: Secondary | ICD-10-CM | POA: Diagnosis not present

## 2017-06-01 DIAGNOSIS — Z96653 Presence of artificial knee joint, bilateral: Secondary | ICD-10-CM | POA: Diagnosis not present

## 2017-06-01 DIAGNOSIS — E1165 Type 2 diabetes mellitus with hyperglycemia: Secondary | ICD-10-CM | POA: Diagnosis not present

## 2017-06-01 DIAGNOSIS — N400 Enlarged prostate without lower urinary tract symptoms: Secondary | ICD-10-CM | POA: Diagnosis not present

## 2017-06-01 DIAGNOSIS — I10 Essential (primary) hypertension: Secondary | ICD-10-CM | POA: Diagnosis not present

## 2017-06-01 DIAGNOSIS — E782 Mixed hyperlipidemia: Secondary | ICD-10-CM | POA: Diagnosis not present

## 2017-06-01 DIAGNOSIS — D509 Iron deficiency anemia, unspecified: Secondary | ICD-10-CM | POA: Diagnosis not present

## 2017-06-08 ENCOUNTER — Encounter (INDEPENDENT_AMBULATORY_CARE_PROVIDER_SITE_OTHER): Payer: Self-pay

## 2017-06-08 ENCOUNTER — Encounter (INDEPENDENT_AMBULATORY_CARE_PROVIDER_SITE_OTHER): Payer: Self-pay | Admitting: *Deleted

## 2017-06-08 ENCOUNTER — Ambulatory Visit (INDEPENDENT_AMBULATORY_CARE_PROVIDER_SITE_OTHER): Payer: Medicare Other | Admitting: Internal Medicine

## 2017-06-08 ENCOUNTER — Encounter (INDEPENDENT_AMBULATORY_CARE_PROVIDER_SITE_OTHER): Payer: Self-pay | Admitting: Internal Medicine

## 2017-06-08 VITALS — BP 142/68 | HR 64 | Temp 97.7°F | Ht 65.0 in | Wt 170.6 lb

## 2017-06-08 DIAGNOSIS — I2510 Atherosclerotic heart disease of native coronary artery without angina pectoris: Secondary | ICD-10-CM

## 2017-06-08 DIAGNOSIS — L400 Psoriasis vulgaris: Secondary | ICD-10-CM | POA: Diagnosis not present

## 2017-06-08 DIAGNOSIS — D2239 Melanocytic nevi of other parts of face: Secondary | ICD-10-CM | POA: Diagnosis not present

## 2017-06-08 DIAGNOSIS — D225 Melanocytic nevi of trunk: Secondary | ICD-10-CM | POA: Diagnosis not present

## 2017-06-08 DIAGNOSIS — R1030 Lower abdominal pain, unspecified: Secondary | ICD-10-CM

## 2017-06-08 DIAGNOSIS — D2262 Melanocytic nevi of left upper limb, including shoulder: Secondary | ICD-10-CM | POA: Diagnosis not present

## 2017-06-08 DIAGNOSIS — D1801 Hemangioma of skin and subcutaneous tissue: Secondary | ICD-10-CM | POA: Diagnosis not present

## 2017-06-08 DIAGNOSIS — D2271 Melanocytic nevi of right lower limb, including hip: Secondary | ICD-10-CM | POA: Diagnosis not present

## 2017-06-08 DIAGNOSIS — D485 Neoplasm of uncertain behavior of skin: Secondary | ICD-10-CM | POA: Diagnosis not present

## 2017-06-08 DIAGNOSIS — L821 Other seborrheic keratosis: Secondary | ICD-10-CM | POA: Diagnosis not present

## 2017-06-08 NOTE — Progress Notes (Signed)
Subjective:    Patient ID: Luke [name], male    DOB: 1951/04/10, 66 y.o.   MRN: [id number]  HPI Presents today with c/o Hx of UGIB. Admitted to [facility] 2016 with hemoglobin of 5.5. Hx of melena. Admitted to [facility] in April of 2016 with a hemoglobin of 5.5. Hx of melena. 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 Benson Norway. Small bowel Given's capsule study was negative. Colonoscopy in May of 2016 without any significant findings.  States Thanksgiving he vomited.  He was sore thru his abdomen. Saw Dr. [name] and he states his WBC ct was up. Gave stool sample last week.  He had nausea and vomiting x 8 hrs.  He states his BMs are normal now.  No melena or BRRB. Having 2 stools a day.s There is no abdominal pain.  5/9/2016Colonoscopy  Indications:Patient is 66 year old Caucasian male with history of iron deficiency anemia with workup in 2010. He was seen by me in April 2015 and his hemoglobin was 14 g. Stool was guaiac negative. By mouth iron was discontinued. He now presents with few weeks history of melena and noted to have hemoglobin of 5.5 g. He was admitted to 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 Carol Ada. These lesions are felt not to be the source of bleeding.  Impression:   Normal mucosa of terminal ileum. Focal edema/thickening to mucosal fold at sigmoid colon. Biopsy taken.  Biopsy from: Is negative for adenoma. Results reviewed with patient.  01/10/2017 H and H 12.9 and 40.5.   CBC    Component Value Date/Time   WBC 7.2 05/31/2016 0726   RBC 4.57 05/31/2016 0726   HGB 12.5 (L) 05/31/2016 0726   HCT 38.9 05/31/2016 0726   PLT 262 05/31/2016 0726   MCV 85.1 05/31/2016 0726   MCH 27.4 05/31/2016 0726   MCHC 32.1 05/31/2016 0726   RDW 16.1 (H) 05/31/2016 0726   LYMPHSABS 1.0 10/03/2014 1010   MONOABS 0.7 10/03/2014 1010   EOSABS 0.1  10/03/2014 1010   BASOSABS 0.1 10/03/2014 1010      Review of Systems  Past Medical History:  Diagnosis Date  . CAD (coronary artery disease) 10/02/2010   nuclear study show normal perfusion on medical therapy without scar or ischemia. EF 64%  . Diabetes mellitus ([place])    type 2   . GERD (gastroesophageal reflux disease)   . Heart murmur   . Hematochezia 11/19/2009  . Hyperlipidemia   . Hypertension   . Iron deficiency anemia 11/19/2009  . Osteoarthritis    left knee  . 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 Sleep apnea    study 20 yrs ago. no cpap used now    Past Surgical History:  Procedure Laterality Date  . CARDIAC CATHETERIZATION  08/1999   which revealed a mild coronary artery disease with 60 and 70 and 80% stenosis in a small first diagonal vessel at the LAD, 50% mid LAD stenosis, 50% ostial left circumflrx stenosis, and 40% ostial and proximal intermediate stenosis. he had 10 to 20% irregularities of his mid right coronary artery.  Marland Kitchen CIRCUMCISION  07/17/2012   Procedure: CIRCUMCISION ADULT;  Surgeon: Dutch Gray, MD;  Location: [facility];  Service: Urology;  Laterality: N/A;  . COLONOSCOPY  02/14/06   M JENKINS  . COLONOSCOPY N/A 10/28/2014   Procedure: COLONOSCOPY;  Surgeon: Rogene Houston, MD;  [facility];  Service: Endoscopy;  Laterality: N/A;  730  . ENTEROSCOPY N/A 10/04/2014   Procedure: ENTEROSCOPY;  Surgeon: Carol Ada, MD;  Location: [facility] ENDOSCOPY;  Service: Endoscopy;  Laterality: N/A;  . ERCP  12/25/1996   ROURK  . GIVENS CAPSULE STUDY N/A 10/04/2014   Procedure: GIVENS CAPSULE STUDY;  Surgeon: Carol Ada, MD;  Location: [place];  Service: Endoscopy;  Laterality: N/A;  . JOINT REPLACEMENT  07-12-12   2008/2007 -knee replacements  . SMALL BOWEL GIVENS  11/25/2008  . UPPER GASTROINTESTINAL ENDOSCOPY  11/08/2008   EGD TCS    Allergies  Allergen Reactions  . Morphine And Related Itching    Current Outpatient Medications on File  Prior to Visit  Medication Sig Dispense Refill  . aspirin 81 MG tablet Take 1 tablet (81 mg total) by mouth daily. HOLD FOR 1 WEEK, THEN RESUME IF NO BLEEDING PROBLEMS 30 tablet   . atorvastatin (LIPITOR) 40 MG tablet TAKE 1 TABLET EVERY DAY 90 tablet 3  . bisoprolol-hydrochlorothiazide (ZIAC) 2.5-6.25 MG tablet TAKE 1 TABLET EVERY DAY 90 tablet 3  . bisoprolol-hydrochlorothiazide (ZIAC) 5-6.25 MG tablet Take 1 tablet by mouth daily. 90 tablet 3  . clobetasol (TEMOVATE) 0.05 % external solution Use as directed    . ferrous sulfate 325 (65 FE) MG EC tablet Take 1 tablet (325 mg total) by mouth 2 (two) times daily after a meal. (Patient taking differently: Take 325 mg by mouth daily with breakfast. Once a day)  3  . finasteride (PROSCAR) 5 MG tablet Take 5 mg by mouth daily.    . Insulin Degludec (TRESIBA FLEXTOUCH [place]) Inject 10 Units into the skin daily.    . Liraglutide (VICTOZA [place]) Inject 0.6 mg into the skin daily.    Marland Kitchen loperamide (IMODIUM) 2 MG capsule Take 1 capsule (2 mg total) by mouth daily before breakfast. 1 capsule 5  . metFORMIN (GLUMETZA) 1000 MG (MOD) 24 hr tablet Take 1,000 mg by mouth 2 (two) times daily.     . pantoprazole (PROTONIX) 40 MG tablet TAKE 1 TABLET EVERY DAY 90 tablet 3  . ramipril (ALTACE) 10 MG capsule TAKE 1 CAPSULE EVERY DAY 90 capsule 3  . tamsulosin (FLOMAX) 0.4 MG CAPS capsule Take 0.4 mg by mouth.    . triamcinolone cream (KENALOG) 0.1 % Use as directed     No current facility-administered medications on file prior to visit.         Objective:   Physical Exam Blood pressure (!) 142/68, pulse 64, temperature 97.7 F (36.5 C), height 5\' 5"  (1.651 m), weight 170 lb 9.6 oz (77.4 kg). Alert and oriented. Skin warm and dry. Oral mucosa is moist.   . Sclera anicteric, conjunctivae is pink. Thyroid not enlarged. No cervical lymphadenopathy. Lungs clear. Heart regular rate and rhythm.  Abdomen is soft. Bowel sounds are positive. No hepatomegaly. No abdominal  masses felt. Slight lower abdominal  tenderness.  No edema to lower extremities.          Assessment & Plan:  Nausea and vomiting. Will get labs from Dr. [name]. US Abdomen.

## 2017-06-08 NOTE — Patient Instructions (Signed)
US abdomen 

## 2017-06-15 ENCOUNTER — Ambulatory Visit (HOSPITAL_COMMUNITY)
Admission: RE | Admit: 2017-06-15 | Discharge: 2017-06-15 | Disposition: A | Discharge status: Home / Self Care | Payer: Medicare Other | Source: Ambulatory Visit | Attending: Internal Medicine | Admitting: Internal Medicine

## 2017-06-15 DIAGNOSIS — R932 Abnormal findings on diagnostic imaging of liver and biliary tract: Secondary | ICD-10-CM | POA: Diagnosis not present

## 2017-06-15 DIAGNOSIS — R93421 Abnormal radiologic findings on diagnostic imaging of right kidney: Secondary | ICD-10-CM | POA: Insufficient documentation

## 2017-06-15 DIAGNOSIS — R1030 Lower abdominal pain, unspecified: Secondary | ICD-10-CM

## 2017-06-15 DIAGNOSIS — K8020 Calculus of gallbladder without cholecystitis without obstruction: Secondary | ICD-10-CM | POA: Diagnosis not present

## 2017-06-15 DIAGNOSIS — K838 Other specified diseases of biliary tract: Secondary | ICD-10-CM | POA: Insufficient documentation

## 2017-06-15 DIAGNOSIS — K8010 Calculus of gallbladder with chronic cholecystitis without obstruction: Secondary | ICD-10-CM | POA: Insufficient documentation

## 2017-06-17 DIAGNOSIS — E782 Mixed hyperlipidemia: Secondary | ICD-10-CM | POA: Diagnosis not present

## 2017-06-17 DIAGNOSIS — Z6829 Body mass index (BMI) 29.0-29.9, adult: Secondary | ICD-10-CM | POA: Diagnosis not present

## 2017-06-17 DIAGNOSIS — N400 Enlarged prostate without lower urinary tract symptoms: Secondary | ICD-10-CM | POA: Diagnosis not present

## 2017-06-17 DIAGNOSIS — D72829 Elevated white blood cell count, unspecified: Secondary | ICD-10-CM | POA: Diagnosis not present

## 2017-06-17 DIAGNOSIS — I10 Essential (primary) hypertension: Secondary | ICD-10-CM | POA: Diagnosis not present

## 2017-06-17 DIAGNOSIS — R1084 Generalized abdominal pain: Secondary | ICD-10-CM | POA: Diagnosis not present

## 2017-06-17 DIAGNOSIS — Z96653 Presence of artificial knee joint, bilateral: Secondary | ICD-10-CM | POA: Diagnosis not present

## 2017-06-17 DIAGNOSIS — E1165 Type 2 diabetes mellitus with hyperglycemia: Secondary | ICD-10-CM | POA: Diagnosis not present

## 2017-06-17 DIAGNOSIS — D509 Iron deficiency anemia, unspecified: Secondary | ICD-10-CM | POA: Diagnosis not present

## 2017-07-07 ENCOUNTER — Encounter (HOSPITAL_COMMUNITY): Payer: Self-pay | Admitting: Internal Medicine

## 2017-07-07 ENCOUNTER — Inpatient Hospital Stay (HOSPITAL_COMMUNITY)
Admission: EM | Admit: 2017-07-07 | Discharge: 2017-07-08 | DRG: 419 | Disposition: A | Discharge status: Home / Self Care | Payer: Medicare Other | Attending: General Surgery | Admitting: General Surgery

## 2017-07-07 ENCOUNTER — Other Ambulatory Visit: Payer: Self-pay

## 2017-07-07 ENCOUNTER — Encounter (HOSPITAL_COMMUNITY): Payer: Self-pay | Admitting: Emergency Medicine

## 2017-07-07 ENCOUNTER — Inpatient Hospital Stay (HOSPITAL_COMMUNITY): Payer: Medicare Other

## 2017-07-07 ENCOUNTER — Inpatient Hospital Stay (HOSPITAL_COMMUNITY): Payer: Medicare Other | Attending: Internal Medicine | Admitting: Internal Medicine

## 2017-07-07 ENCOUNTER — Emergency Department (HOSPITAL_COMMUNITY): Payer: Medicare Other

## 2017-07-07 DIAGNOSIS — Z885 Allergy status to narcotic agent status: Secondary | ICD-10-CM | POA: Diagnosis not present

## 2017-07-07 DIAGNOSIS — D72829 Elevated white blood cell count, unspecified: Secondary | ICD-10-CM | POA: Insufficient documentation

## 2017-07-07 DIAGNOSIS — Z7982 Long term (current) use of aspirin: Secondary | ICD-10-CM

## 2017-07-07 DIAGNOSIS — E1165 Type 2 diabetes mellitus with hyperglycemia: Secondary | ICD-10-CM | POA: Diagnosis present

## 2017-07-07 DIAGNOSIS — E785 Hyperlipidemia, unspecified: Secondary | ICD-10-CM | POA: Diagnosis present

## 2017-07-07 DIAGNOSIS — E119 Type 2 diabetes mellitus without complications: Secondary | ICD-10-CM | POA: Insufficient documentation

## 2017-07-07 DIAGNOSIS — Z794 Long term (current) use of insulin: Secondary | ICD-10-CM | POA: Diagnosis not present

## 2017-07-07 DIAGNOSIS — I2510 Atherosclerotic heart disease of native coronary artery without angina pectoris: Secondary | ICD-10-CM | POA: Diagnosis present

## 2017-07-07 DIAGNOSIS — K219 Gastro-esophageal reflux disease without esophagitis: Secondary | ICD-10-CM | POA: Diagnosis present

## 2017-07-07 DIAGNOSIS — Z96653 Presence of artificial knee joint, bilateral: Secondary | ICD-10-CM | POA: Diagnosis not present

## 2017-07-07 DIAGNOSIS — D509 Iron deficiency anemia, unspecified: Secondary | ICD-10-CM | POA: Diagnosis not present

## 2017-07-07 DIAGNOSIS — I159 Secondary hypertension, unspecified: Secondary | ICD-10-CM

## 2017-07-07 DIAGNOSIS — K810 Acute cholecystitis: Secondary | ICD-10-CM

## 2017-07-07 DIAGNOSIS — K819 Cholecystitis, unspecified: Secondary | ICD-10-CM | POA: Diagnosis not present

## 2017-07-07 DIAGNOSIS — N400 Enlarged prostate without lower urinary tract symptoms: Secondary | ICD-10-CM | POA: Insufficient documentation

## 2017-07-07 DIAGNOSIS — K8000 Calculus of gallbladder with acute cholecystitis without obstruction: Secondary | ICD-10-CM | POA: Diagnosis not present

## 2017-07-07 DIAGNOSIS — R739 Hyperglycemia, unspecified: Secondary | ICD-10-CM

## 2017-07-07 DIAGNOSIS — R1011 Right upper quadrant pain: Secondary | ICD-10-CM | POA: Diagnosis not present

## 2017-07-07 DIAGNOSIS — K8020 Calculus of gallbladder without cholecystitis without obstruction: Secondary | ICD-10-CM | POA: Diagnosis not present

## 2017-07-07 DIAGNOSIS — Z87891 Personal history of nicotine dependence: Secondary | ICD-10-CM

## 2017-07-07 DIAGNOSIS — I10 Essential (primary) hypertension: Secondary | ICD-10-CM | POA: Diagnosis not present

## 2017-07-07 DIAGNOSIS — Z79899 Other long term (current) drug therapy: Secondary | ICD-10-CM | POA: Insufficient documentation

## 2017-07-07 DIAGNOSIS — R109 Unspecified abdominal pain: Secondary | ICD-10-CM | POA: Diagnosis present

## 2017-07-07 LAB — COMPREHENSIVE METABOLIC PANEL
ALBUMIN: 3.9 g/dL (ref 3.5–5.0)
ALT: 17 U/L (ref 17–63)
ANION GAP: 13 (ref 5–15)
AST: 20 U/L (ref 15–41)
Alkaline Phosphatase: 69 U/L (ref 38–126)
BUN: 12 mg/dL (ref 6–20)
CHLORIDE: 98 mmol/L — AB (ref 101–111)
CO2: 24 mmol/L (ref 22–32)
Calcium: 9.2 mg/dL (ref 8.9–10.3)
Creatinine, Ser: 0.89 mg/dL (ref 0.61–1.24)
GFR calc Af Amer: >60 mL/min (ref >60)
GFR calc non Af Amer: >60 mL/min (ref >60)
GLUCOSE: 265 mg/dL — AB (ref 65–99)
POTASSIUM: 3.5 mmol/L (ref 3.5–5.1)
SODIUM: 135 mmol/L (ref 135–145)
TOTAL PROTEIN: 6.8 g/dL (ref 6.5–8.1)
Total Bilirubin: 0.4 mg/dL (ref 0.3–1.2)

## 2017-07-07 LAB — CBC WITH DIFFERENTIAL/PLATELET
BASOS ABS: 0.1 10*3/uL (ref 0.0–0.1)
Basophils Relative: 1 %
Eosinophils Absolute: 0.3 10*3/uL (ref 0.0–0.7)
Eosinophils Relative: 4 %
HCT: 39.5 % (ref 39.0–52.0)
HEMOGLOBIN: 11.8 g/dL — AB (ref 13.0–17.0)
LYMPHS ABS: 1.6 10*3/uL (ref 0.7–4.0)
Lymphocytes Relative: 25 %
MCH: 24.9 pg — ABNORMAL LOW (ref 26.0–34.0)
MCHC: 29.9 g/dL — ABNORMAL LOW (ref 30.0–36.0)
MCV: 83.5 fL (ref 78.0–100.0)
Monocytes Absolute: 0.5 10*3/uL (ref 0.1–1.0)
Monocytes Relative: 8 %
NEUTROS PCT: 62 %
Neutro Abs: 4.1 10*3/uL (ref 1.7–7.7)
Platelets: 302 10*3/uL (ref 150–400)
RBC: 4.73 MIL/uL (ref 4.22–5.81)
RDW: 15.6 % — ABNORMAL HIGH (ref 11.5–15.5)
WBC: 6.5 10*3/uL (ref 4.0–10.5)

## 2017-07-07 LAB — CBC
HEMATOCRIT: 36.5 % — AB (ref 39.0–52.0)
HEMOGLOBIN: 11.1 g/dL — AB (ref 13.0–17.0)
MCH: 25.1 pg — AB (ref 26.0–34.0)
MCHC: 30.4 g/dL (ref 30.0–36.0)
MCV: 82.4 fL (ref 78.0–100.0)
Platelets: 322 10*3/uL (ref 150–400)
RBC: 4.43 MIL/uL (ref 4.22–5.81)
RDW: 15.6 % — ABNORMAL HIGH (ref 11.5–15.5)
WBC: 7.9 10*3/uL (ref 4.0–10.5)

## 2017-07-07 LAB — LIPASE, BLOOD: LIPASE: 18 U/L (ref 11–51)

## 2017-07-07 LAB — PATHOLOGIST SMEAR REVIEW

## 2017-07-07 MED ORDER — SODIUM CHLORIDE 0.9 % IV SOLN
Freq: Once | INTRAVENOUS | Status: AC
  Administered 2017-07-08: 01:00:00 via INTRAVENOUS

## 2017-07-07 MED ORDER — SODIUM CHLORIDE 0.9 % IV BOLUS (SEPSIS)
1000.0000 mL | Freq: Once | INTRAVENOUS | Status: AC
  Administered 2017-07-07: 1000 mL via INTRAVENOUS

## 2017-07-07 MED ORDER — ONDANSETRON HCL 4 MG/2ML IJ SOLN
4.0000 mg | Freq: Once | INTRAMUSCULAR | Status: AC
  Administered 2017-07-07: 4 mg via INTRAVENOUS
  Filled 2017-07-07: qty 2

## 2017-07-07 MED ORDER — HYDROMORPHONE HCL 1 MG/ML IJ SOLN
0.5000 mg | Freq: Once | INTRAMUSCULAR | Status: AC
  Administered 2017-07-07: 0.5 mg via INTRAVENOUS
  Filled 2017-07-07: qty 1

## 2017-07-07 MED ORDER — IOPAMIDOL (ISOVUE-300) INJECTION 61%
100.0000 mL | Freq: Once | INTRAVENOUS | Status: AC | PRN
  Administered 2017-07-07: 100 mL via INTRAVENOUS

## 2017-07-07 NOTE — ED Notes (Signed)
Pt states he took 2-325mg  Motrin an hour ago for the pain with no relief.

## 2017-07-07 NOTE — Progress Notes (Signed)
Medical Oncology/Hematology Initial Consultation note   Luke [name] [facility]),  July 07, 2017  Chief complaint: Consultation requested for leukocytosis by Primary Care Physician Dr. Allyn Kenner   History of present illness: Luke [name] is a pleasant 67 year old gentleman who was in his usual state of health he had seen his primary care physician for routine visits for diabetes care and the last 2 visits he was noted to have elevated white blood cell counts.Upon detailed review of his history patient notes that around Thanksgiving he was sick with vomiting abdominal pain that occurred suddenly.  While the vomiting subsided after 1 day he continued to have symptoms of abdominal discomfort malleus that lasted for approximately 2 weeks.  He is completely recovered from that episode now there was no close contacts with similar symptoms.   CBC from 06/17/2017 faxed from his office performed at [facility] shows white blood cell count of 11.5 hemoglobin 11.4 platelets 431 absolute neutrophil count was elevated at seven-point 8 repeat CBC from 05/27/2017 showed a white blood cell count of 14.7 hemoglobin 12.6 platelets 447 neutrophils were 11.8.   CMP was was unremarkable except for mild hyperglycemia. Review of systems:He currently denies any bleeding no change in his bowel habits he takes oral iron supplements regularly.  Denies any melena hematochezia.  Weight is normal appetite is normal he denies any excess fatigue.   All other systems review is negative.  Past medical history: Patient has a history of long-standing diabetes type 2 he is currently on metformin and Xarelto feet 20 units daily.  He states his diabetes is well controlled he has no symptoms of peripheral neuropathy.  He has a history of hypertension hyperlipidemia BPH.    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  He subsequently underwent a colonoscopy and a capsule enteroscopy all of which were benign. 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 Jearld Adjutant from GI and is up-to-date with his EGD and colonoscopies.    His family history has been reviewed and noted in the EMR and is noncontributory to his current illness.  His social history has been reviewed per EMR His current medications have been reviewed.   His vital signs have been reviewed.  And are noted to be stable.   There were no vitals taken for this visit.  General Appearance:    Alert, cooperative, no distress, appears stated age  Head:    Normocephalic, without obvious abnormality, atraumatic  Eyes:    PERRL, conjunctiva/corneas clear, EOM's intact, fundi    benign, both eyes          Nose:   no drainage  or sinus tenderness  Throat:   Lips, mucosa, and tongue normal; teeth and gums normal  Neck:   Supple, symmetrical, trachea midline, no adenopathy;       thyroid:  No enlargement/tenderness/nodules.  Back:     Symmetric, no curvature, ROM normal, no CVA tenderness  Lungs:     Clear to auscultation bilaterally, respirations unlabored  Chest wall:    No tenderness or deformity  Heart:    Regular rate and rhythm, S1 and S2 normal, no murmur, rub   or gallop  Abdomen:     Soft, non-tender, bowel sounds active all four quadrants,  no masses, no organomegaly        Extremities:   Extremities normal, atraumatic, no cyanosis or edema  Pulses:   2+ and symmetric all extremities  Skin:   Skin color, texture, turgor normal, no rashes or lesions  Lymph nodes:   Cervical, supraclavicular, and axillary nodes normal  Neurologic:   CNII-XII intact. Normal strength, sensation and reflexes      throughout    Impression and plan: -Mild transient leukocytosis predominantly neutrophilia along with mild thrombocytosis that started first  on a CBC dated 05/27/2017 a repeat CBC on 1228 shows a decline pending  trend of white blood cell count and platelet counts.    I believe he may have had some sort of food poisoning/viral gastritis.  -He does not have any symptoms of underlying inflammation at this time he did have an ultrasound abdomen that showed gallstones without any evidence of obstruction.  -I have requested a repeat CBC today I am expecting that the counts would be normal.  I will have smear review by pathology.  -No additional tests are necessary today unless his counts remain persistently elevated today.  -I would contact him once CBC results are back.  If they are normalized he will not need any additional workup.  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  He is not currently bleeding he is not anemic but he continues to take iron supplements he remains in follow-up with GI.  -Hypertension his blood pressure is noted to be elevated today he states that it is better controlled now than before and he is working with his primary care and cardiology and adjusting his medications to optimize control.  -He will return to the care of primary care physician will see him back on a as needed basis in the future if there is persistent abnormalities in his peripheral blood counts.

## 2017-07-07 NOTE — ED Provider Notes (Addendum)
[facility] EMERGENCY DEPARTMENT Provider Note   CSN: [id number] Arrival date & time: 07/07/17  2223     History   Chief Complaint Chief Complaint  Patient presents with  . Abdominal Pain    HPI Luke [name] is a 67 y.o. male.  HPI Luke [name] is a 67 y.o. male with hx of CAD, hyperlipidimia, HTN, anemia, pancreatitis, presents to ED with complaint of abdominal pain. Pt states pain started approximately 2 hrs ago. Pain is sharp, diffuse, worse in right upper abdomen. States recently diagnosed with gallstones. Reports nausea and several episodes of emesis. No recent BM. No diarrhea. No fever or chills. No blood in stool or emesis. No tx prior to coming in. No prior abdominal surgeries. Nothing making his symptoms better or worse.   Past Medical History:  Diagnosis Date  . CAD (coronary artery disease) 10/02/2010   nuclear study show normal perfusion on medical therapy without scar or ischemia. EF 64%  . Diabetes mellitus ([place])    type 2   . GERD (gastroesophageal reflux disease)   . Heart murmur   . Hematochezia 11/19/2009  . Hyperlipidemia   . Hypertension   . Iron deficiency anemia 11/19/2009  . Osteoarthritis    left knee  . 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 Sleep apnea    study 20 yrs ago. no cpap used now    Patient Active Problem List   Diagnosis Date Noted  . Absolute anemia   . Vitamin B 12 deficiency 10/04/2014  . GI bleed-Occult ? 10/03/2014  . CAD (coronary artery disease) 12/13/2012  . GERD (gastroesophageal reflux disease) 08/31/2011  . IDA (iron deficiency anemia) 08/31/2011  . DM type 2 (diabetes mellitus, type 2) ([place]) 08/31/2011  . HTN (hypertension) 08/31/2011  . Hyperlipemia 08/31/2011    Past Surgical History:  Procedure Laterality Date  . CARDIAC CATHETERIZATION  08/1999   which revealed a mild coronary artery disease with 60 and 70 and 80% stenosis in a small first diagonal vessel at the LAD, 50% mid LAD stenosis, 50% ostial left  circumflrx stenosis, and 40% ostial and proximal intermediate stenosis. he had 10 to 20% irregularities of his mid right coronary artery.  Marland Kitchen CIRCUMCISION  07/17/2012   Procedure: CIRCUMCISION ADULT;  Surgeon: Dutch Gray, MD;  Location: [facility];  Service: Urology;  Laterality: N/A;  . COLONOSCOPY  02/14/06   M JENKINS  . COLONOSCOPY N/A 10/28/2014   Procedure: COLONOSCOPY;  Surgeon: Rogene Houston, MD;  [facility];  Service: Endoscopy;  Laterality: N/A;  730  . ENTEROSCOPY N/A 10/04/2014   Procedure: ENTEROSCOPY;  Surgeon: Carol Ada, MD;  Location: [facility] ENDOSCOPY;  Service: Endoscopy;  Laterality: N/A;  . ERCP  12/25/1996   ROURK  . GIVENS CAPSULE STUDY N/A 10/04/2014   Procedure: GIVENS CAPSULE STUDY;  Surgeon: Carol Ada, MD;  Location: [place];  Service: Endoscopy;  Laterality: N/A;  . JOINT REPLACEMENT  07-12-12   2008/2007 -knee replacements  . SMALL BOWEL GIVENS  11/25/2008  . UPPER GASTROINTESTINAL ENDOSCOPY  11/08/2008   EGD TCS       Home Medications    Prior to Admission medications   Medication Sig Start Date End Date Taking? Authorizing Provider  aspirin 81 MG tablet Take 1 tablet (81 mg total) by mouth daily. HOLD FOR 1 WEEK, THEN RESUME IF NO BLEEDING PROBLEMS Patient taking differently: Take 81 mg by mouth daily.  10/05/14   Delfina Redwood, MD  atorvastatin (LIPITOR) 40 MG tablet TAKE  1 TABLET EVERY DAY 04/14/17   Troy Sine, MD  bisoprolol-hydrochlorothiazide [facility]) 2.5-6.25 MG tablet TAKE 1 TABLET EVERY DAY 05/27/17   Troy Sine, MD  cetirizine (ZYRTEC) 10 MG tablet Take 10 mg by mouth daily.    [provider]  empagliflozin (JARDIANCE) 10 MG TABS tablet Take 10 mg by mouth daily.    [provider]  ferrous sulfate 325 (65 FE) MG EC tablet Take 1 tablet (325 mg total) by mouth 2 (two) times daily after a meal. Patient taking differently: Take 325 mg by mouth daily with breakfast. Once a day 10/28/14   Rogene Houston, MD   finasteride (PROSCAR) 5 MG tablet Take 5 mg by mouth daily.    [provider]  Insulin Glargine-Lixisenatide (SOLIQUA) 100-33 UNT-MCG/ML SOPN Inject 1 Units into the skin daily.    [provider]  loperamide (IMODIUM) 2 MG capsule Take 1 capsule (2 mg total) by mouth daily before breakfast. 02/18/15   Rehman, Mechele Dawley, MD  metFORMIN (GLUMETZA) 1000 MG (MOD) 24 hr tablet Take 1,000 mg by mouth 2 (two) times daily.     [provider]  pantoprazole (PROTONIX) 40 MG tablet TAKE 1 TABLET EVERY DAY 05/27/17   Rehman, Mechele Dawley, MD  ramipril (ALTACE) 10 MG capsule TAKE 1 CAPSULE EVERY DAY 04/14/17   Troy Sine, MD  tamsulosin (FLOMAX) 0.4 MG CAPS capsule Take 0.4 mg by mouth.    [provider]    Family History Family History  Problem Relation Age of Onset  . Healthy Sister   . Healthy Brother   . Healthy Sister   . Healthy Brother   . Healthy Brother   . Healthy Daughter   . Healthy Daughter     Social History Social History   Tobacco Use  . Smoking status: Former Smoker    Packs/day: 1.00    Years: 50.00    Pack years: 50.00    Types: Cigarettes    Last attempt to quit: 09/28/2014    Years since quitting: 2.7  . Smokeless tobacco: Never Used  . Tobacco comment: patient smokes 1/2 pack per day  Substance Use Topics  . Alcohol use: No    Alcohol/week: 0.0 oz  . Drug use: No     Allergies   Morphine and related   Review of Systems Review of Systems  Constitutional: Negative for chills and fever.  Respiratory: Negative for cough, chest tightness and shortness of breath.   Cardiovascular: Negative for chest pain, palpitations and leg swelling.  Gastrointestinal: Positive for abdominal pain, nausea and vomiting. Negative for abdominal distention and diarrhea.  Genitourinary: Negative for dysuria, frequency, hematuria and urgency.  Musculoskeletal: Negative for arthralgias, myalgias, neck pain and neck stiffness.  Skin: Negative for  rash.  Allergic/Immunologic: Negative for immunocompromised state.  Neurological: Negative for dizziness, weakness, light-headedness, numbness and headaches.     Physical Exam Updated Vital Signs BP (!) 197/137 (BP Location: Left Arm)   Pulse 60   Temp 97.7 F (36.5 C) (Oral)   Resp 20   Ht 5\' 5"  (1.651 m)   Wt 79.4 kg (175 lb)   SpO2 95%   BMI 29.12 kg/m   Physical Exam  Constitutional: He appears well-developed and well-nourished. No distress.  HENT:  Head: Normocephalic and atraumatic.  Eyes: Conjunctivae are normal.  Neck: Neck supple.  Cardiovascular: Normal rate, regular rhythm and normal heart sounds.  Pulmonary/Chest: Effort normal. No respiratory distress. He has no wheezes. He  has no rales.  Abdominal: Soft. Bowel sounds are normal. He exhibits no distension. There is tenderness in the right upper quadrant and epigastric area. There is no rigidity, no rebound and no guarding.  Musculoskeletal: He exhibits no edema.  Neurological: He is alert.  Skin: Skin is warm and dry.  Nursing note and vitals reviewed.    ED Treatments / Results  Labs (all labs ordered are listed, but only abnormal results are displayed) Labs Reviewed  COMPREHENSIVE METABOLIC PANEL - Abnormal; Notable for the following components:      Result Value   Chloride 98 (*)    Glucose, Bld 265 (*)    All other components within normal limits  CBC - Abnormal; Notable for the following components:   Hemoglobin 11.1 (*)    HCT 36.5 (*)    MCH 25.1 (*)    RDW 15.6 (*)    All other components within normal limits  URINALYSIS, ROUTINE W REFLEX MICROSCOPIC - Abnormal; Notable for the following components:   Color, Urine STRAW (*)    Glucose, UA >=500 (*)    All other components within normal limits  LIPASE, BLOOD    EKG ED ECG REPORT   Date: 07/26/2017  Rate: 70  Rhythm: normal sinus rhythm  QRS Axis: normal  Intervals: normal  ST/T Wave abnormalities: nonspecific T wave changes   Conduction Disutrbances:none  Narrative Interpretation:   Old EKG Reviewed: unchanged  I have personally reviewed the EKG tracing and agree with the computerized printout as noted.  Radiology Ct Abdomen Pelvis W Contrast  Result Date: 07/08/2017 CLINICAL DATA:  67 y/o M; generalized abdominal pain with 2 episodes of vomiting. EXAM: CT ABDOMEN AND PELVIS WITH CONTRAST TECHNIQUE: Multidetector CT imaging of the abdomen and pelvis was performed using the standard protocol following bolus administration of intravenous contrast. CONTRAST:  121mL ISOVUE-300 IOPAMIDOL (ISOVUE-300) INJECTION 61% COMPARISON:  06/15/2017 abdominal ultrasound. FINDINGS: Lower chest: No acute abnormality. Hepatobiliary: No focal liver lesion. Gallbladder wall thickening and pericholecystic fluid with 7 mm stone at the gallbladder neck (series 2, image 24). No intrahepatic biliary ductal dilatation. Common bile duct measures up to 10 mm, no obstructing stone or mass identified. Pancreas: Unremarkable. No pancreatic ductal dilatation or surrounding inflammatory changes. Spleen: Normal in size without focal abnormality. Adrenals/Urinary Tract: Adrenal glands are unremarkable. Kidneys are normal, without renal calculi, focal lesion, or hydronephrosis. Bladder is unremarkable. 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 No obstructive or inflammatory changes of stomach, small bowel, or large bowel. Normal appendix. Vascular/Lymphatic: Aortic atherosclerosis. No enlarged abdominal or pelvic lymph nodes. Reproductive: Moderate prostate enlargement. Other: No abdominal wall hernia or abnormality. No abdominopelvic ascites. Musculoskeletal: Mild lumbar spine spondylosis. Grade 1 L4-5 anterolisthesis without pars defect. No acute fracture. IMPRESSION: 1. Gallbladder wall thickening, pericholecystic fluid, and 7 mm gallstone within gallbladder neck. Findings are compatible with acute cholecystitis. 2. Moderate  prostate enlargement. 3. 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 4. Mild lumbar spine spondylosis. Electronically Signed   By: Kristine Garbe M.D.   On: 07/08/2017 00:36    Procedures Procedures (including critical care time)  Medications Ordered in ED Medications  sodium chloride 0.9 % bolus 1,000 mL (not administered)  0.9 %  sodium chloride infusion (not administered)  HYDROmorphone (DILAUDID) injection 0.5 mg (not administered)  ondansetron (ZOFRAN) injection 4 mg (not administered)     Initial Impression / Assessment and Plan / ED Course  I have reviewed the triage vital signs and the  nursing notes.  Pertinent labs & imaging results that were available during my care of the patient were reviewed by me and considered in my medical decision making (see chart for details).     Pt with acute onset of severe abdominal pain, worse in RUQ. Hx of diagnosis of cholelithiasis, also pancreatitis. Will get labs, CT abd pelvis. Pain meds and anti emetics ordered. No guarding on exam. Pt is hypertensive, states took his meds.   1:05 AM Pt's ct scan positive for cholecystitis. Antibiotics ordered. Discussed with Dr. Arnoldo Morale who will see pt first thing in the morning. Asked for medicine to admit. Spoke with hospitalist, will admit. Discussed plan with pt, agrees. BP improved with pain medications.   Vitals:   07/07/17 2230 07/07/17 2300 07/08/17 0049 07/08/17 0100  BP: (!) 197/137 (!) 199/78 (!) 181/74 (!) 163/105  Pulse: 60 (!) 57 (!) 51 69  Resp: 20   18  Temp: 97.7 F (36.5 C)     TempSrc: Oral     SpO2: 95% 93% 96% 99%  Weight:      Height:         Final Clinical Impressions(s) / ED Diagnoses   Final diagnoses:  Cholecystitis  Secondary hypertension  Hyperglycemia    ED Discharge Orders    None       Jeannett Senior, PA-C 07/08/17 0110    Jeannett Senior, PA-C 32/35/57 3220    Delora Fuel, MD 25/42/70 0731    Jeannett Senior, PA-C 62/37/62 8315    Delora Fuel, MD 17/61/60 2250

## 2017-07-07 NOTE — Patient Instructions (Signed)
[facility] at [facility] Discharge Instructions  RECOMMENDATIONS MADE BY THE CONSULTANT AND ANY TEST RESULTS WILL BE SENT TO YOUR REFERRING PHYSICIAN.  You were seen today by Dr. Peru See schedulers up front for appointments   Thank you for choosing [facility] at [facility] to provide your oncology and hematology care.  To afford each patient quality time with our provider, please arrive at least 15 minutes before your scheduled appointment time.    If you have a lab appointment with the Cancer Center please come in thru the  Main Entrance and check in at the main information desk  You need to re-schedule your appointment should you arrive 10 or more minutes late.  We strive to give you quality time with our providers, and arriving late affects you and other patients whose appointments are after yours.  Also, if you no show three or more times for appointments you may be dismissed from the clinic at the providers discretion.     Again, thank you for choosing [facility].  Our hope is that these requests will decrease the amount of time that you wait before being seen by our physicians.       _____________________________________________________________  Should you have questions after your visit to [facility], please contact our office at [phone] between the hours of 8:30 a.m. and 4:30 p.m.  Voicemails left after 4:30 p.m. will not be returned until the following business day.  For prescription refill requests, have your pharmacy contact our office.       Resources For Cancer Patients and their Caregivers ? American Cancer Society: Can assist with transportation, wigs, general needs, runs Look Good Feel Better.        1-888-227-6333 ? Cancer Care: Provides financial assistance, online support groups, medication/co-pay assistance.  1-800-813-HOPE (4673) ? [facility] Assists  [place] cancer patients and their families through emotional , educational and financial support.  336-427-4357 ? [place] DSS Where to apply for food stamps, Medicaid and utility assistance. 336-342-1394 ? [facility]: Transportation to medical appointments. 336-347-2287 ? Social Security Administration: May apply for disability if have a Stage IV cancer. 336-342-7796 1-800-772-1213 ? [place] Aging, Disability and Transit Services: Assists with nutrition, care and transit needs. 336-349-2343  Cancer Center Support Programs: [email or web address]@ > Cancer Support Group  2nd Tuesday of the month 1pm-2pm, Journey Room  > Creative Journey  3rd Tuesday of the month 1130am-1pm, Journey Room  > Look Good Feel Better  1st Wednesday of the month 10am-12 noon, Journey Room (Call American Cancer Society to register 1-800-395-5775)    

## 2017-07-07 NOTE — ED Triage Notes (Signed)
Pt c/o generalized abd pain with 2 episodes of vomiting.

## 2017-07-08 ENCOUNTER — Other Ambulatory Visit: Payer: Self-pay

## 2017-07-08 ENCOUNTER — Encounter (HOSPITAL_COMMUNITY): Payer: Self-pay | Admitting: Internal Medicine

## 2017-07-08 ENCOUNTER — Inpatient Hospital Stay (HOSPITAL_COMMUNITY): Payer: Medicare Other | Admitting: Anesthesiology

## 2017-07-08 ENCOUNTER — Encounter (HOSPITAL_COMMUNITY)
Admission: EM | Disposition: A | Discharge status: Home / Self Care | Payer: Self-pay | Source: Home / Self Care | Attending: Internal Medicine

## 2017-07-08 DIAGNOSIS — K219 Gastro-esophageal reflux disease without esophagitis: Secondary | ICD-10-CM | POA: Diagnosis present

## 2017-07-08 DIAGNOSIS — R109 Unspecified abdominal pain: Secondary | ICD-10-CM | POA: Diagnosis not present

## 2017-07-08 DIAGNOSIS — Z7982 Long term (current) use of aspirin: Secondary | ICD-10-CM | POA: Diagnosis not present

## 2017-07-08 DIAGNOSIS — K8020 Calculus of gallbladder without cholecystitis without obstruction: Secondary | ICD-10-CM | POA: Diagnosis not present

## 2017-07-08 DIAGNOSIS — Z794 Long term (current) use of insulin: Secondary | ICD-10-CM | POA: Diagnosis not present

## 2017-07-08 DIAGNOSIS — K810 Acute cholecystitis: Secondary | ICD-10-CM | POA: Diagnosis not present

## 2017-07-08 DIAGNOSIS — E785 Hyperlipidemia, unspecified: Secondary | ICD-10-CM | POA: Diagnosis present

## 2017-07-08 DIAGNOSIS — D509 Iron deficiency anemia, unspecified: Secondary | ICD-10-CM | POA: Diagnosis present

## 2017-07-08 DIAGNOSIS — Z96653 Presence of artificial knee joint, bilateral: Secondary | ICD-10-CM | POA: Diagnosis present

## 2017-07-08 DIAGNOSIS — I10 Essential (primary) hypertension: Secondary | ICD-10-CM | POA: Diagnosis present

## 2017-07-08 DIAGNOSIS — Z87891 Personal history of nicotine dependence: Secondary | ICD-10-CM | POA: Diagnosis not present

## 2017-07-08 DIAGNOSIS — K8000 Calculus of gallbladder with acute cholecystitis without obstruction: Secondary | ICD-10-CM | POA: Diagnosis present

## 2017-07-08 DIAGNOSIS — E1165 Type 2 diabetes mellitus with hyperglycemia: Secondary | ICD-10-CM | POA: Diagnosis present

## 2017-07-08 DIAGNOSIS — I2510 Atherosclerotic heart disease of native coronary artery without angina pectoris: Secondary | ICD-10-CM | POA: Diagnosis present

## 2017-07-08 DIAGNOSIS — Z885 Allergy status to narcotic agent status: Secondary | ICD-10-CM | POA: Diagnosis not present

## 2017-07-08 DIAGNOSIS — K8012 Calculus of gallbladder with acute and chronic cholecystitis without obstruction: Secondary | ICD-10-CM | POA: Diagnosis not present

## 2017-07-08 HISTORY — PX: CHOLECYSTECTOMY: SHX55

## 2017-07-08 LAB — MAGNESIUM: MAGNESIUM: 1.7 mg/dL (ref 1.7–2.4)

## 2017-07-08 LAB — GLUCOSE, CAPILLARY
Glucose-Capillary: 121 mg/dL — ABNORMAL HIGH (ref 65–99)
Glucose-Capillary: 136 mg/dL — ABNORMAL HIGH (ref 65–99)

## 2017-07-08 LAB — URINALYSIS, ROUTINE W REFLEX MICROSCOPIC
BACTERIA UA: NONE SEEN
Bilirubin Urine: NEGATIVE
Glucose, UA: >=500 mg/dL — AB
Hgb urine dipstick: NEGATIVE
Ketones, ur: NEGATIVE mg/dL
Leukocytes, UA: NEGATIVE
Nitrite: NEGATIVE
PH: 6 (ref 5.0–8.0)
Protein, ur: NEGATIVE mg/dL
SQUAMOUS EPITHELIAL / LPF: NONE SEEN
Specific Gravity, Urine: 1.028 (ref 1.005–1.030)

## 2017-07-08 LAB — CBG MONITORING, ED: GLUCOSE-CAPILLARY: 104 mg/dL — AB (ref 65–99)

## 2017-07-08 SURGERY — LAPAROSCOPIC CHOLECYSTECTOMY
Anesthesia: General | Site: Abdomen

## 2017-07-08 MED ORDER — SUGAMMADEX SODIUM 500 MG/5ML IV SOLN
INTRAVENOUS | Status: DC | PRN
  Administered 2017-07-08: 250 mg via INTRAVENOUS

## 2017-07-08 MED ORDER — BUPIVACAINE LIPOSOME 1.3 % IJ SUSP
INTRAMUSCULAR | Status: AC
  Filled 2017-07-08: qty 20

## 2017-07-08 MED ORDER — MAGNESIUM SULFATE 2 GM/50ML IV SOLN
2.0000 g | Freq: Once | INTRAVENOUS | Status: AC
  Administered 2017-07-08: 2 g via INTRAVENOUS
  Filled 2017-07-08: qty 50

## 2017-07-08 MED ORDER — LACTATED RINGERS IV SOLN
INTRAVENOUS | Status: DC
  Administered 2017-07-08 (×2): via INTRAVENOUS

## 2017-07-08 MED ORDER — FENTANYL CITRATE (PF) 100 MCG/2ML IJ SOLN
INTRAMUSCULAR | Status: DC | PRN
  Administered 2017-07-08 (×2): 50 ug via INTRAVENOUS
  Administered 2017-07-08: 100 ug via INTRAVENOUS
  Administered 2017-07-08 (×2): 50 ug via INTRAVENOUS

## 2017-07-08 MED ORDER — MIDAZOLAM HCL 2 MG/2ML IJ SOLN
1.0000 mg | INTRAMUSCULAR | Status: AC
  Administered 2017-07-08 (×2): 2 mg via INTRAVENOUS
  Filled 2017-07-08: qty 2

## 2017-07-08 MED ORDER — POVIDONE-IODINE 10 % EX OINT
TOPICAL_OINTMENT | CUTANEOUS | Status: AC
  Filled 2017-07-08: qty 1

## 2017-07-08 MED ORDER — LABETALOL HCL 5 MG/ML IV SOLN
INTRAVENOUS | Status: DC | PRN
  Administered 2017-07-08: 5 mg via INTRAVENOUS

## 2017-07-08 MED ORDER — DEXAMETHASONE SODIUM PHOSPHATE 4 MG/ML IJ SOLN
4.0000 mg | Freq: Once | INTRAMUSCULAR | Status: AC
  Administered 2017-07-08: 4 mg via INTRAVENOUS

## 2017-07-08 MED ORDER — HYDROMORPHONE HCL 1 MG/ML IJ SOLN: 0.5000 mg | INTRAMUSCULAR | Status: DC | PRN

## 2017-07-08 MED ORDER — ONDANSETRON HCL 4 MG/2ML IJ SOLN
4.0000 mg | Freq: Once | INTRAMUSCULAR | Status: AC
  Administered 2017-07-08: 4 mg via INTRAVENOUS

## 2017-07-08 MED ORDER — DEXAMETHASONE SODIUM PHOSPHATE 4 MG/ML IJ SOLN
INTRAMUSCULAR | Status: AC
  Filled 2017-07-08: qty 1

## 2017-07-08 MED ORDER — ONDANSETRON HCL 4 MG/2ML IJ SOLN
INTRAMUSCULAR | Status: AC
  Filled 2017-07-08: qty 2

## 2017-07-08 MED ORDER — CEFTRIAXONE SODIUM 2 G IJ SOLR
2.0000 g | Freq: Once | INTRAMUSCULAR | Status: AC
  Administered 2017-07-08: 2 g via INTRAVENOUS
  Filled 2017-07-08: qty 2

## 2017-07-08 MED ORDER — PANTOPRAZOLE SODIUM 40 MG IV SOLR
40.0000 mg | INTRAVENOUS | Status: DC
  Administered 2017-07-08: 40 mg via INTRAVENOUS

## 2017-07-08 MED ORDER — GLYCOPYRROLATE 0.2 MG/ML IJ SOLN
INTRAMUSCULAR | Status: DC | PRN
  Administered 2017-07-08: 0.2 mg via INTRAVENOUS

## 2017-07-08 MED ORDER — KETOROLAC TROMETHAMINE 30 MG/ML IJ SOLN
30.0000 mg | Freq: Once | INTRAMUSCULAR | Status: AC
  Administered 2017-07-08: 30 mg via INTRAVENOUS
  Filled 2017-07-08: qty 1

## 2017-07-08 MED ORDER — SUCCINYLCHOLINE CHLORIDE 20 MG/ML IJ SOLN
INTRAMUSCULAR | Status: DC | PRN
  Administered 2017-07-08: 140 mg via INTRAVENOUS

## 2017-07-08 MED ORDER — KETOROLAC TROMETHAMINE 30 MG/ML IJ SOLN
30.0000 mg | Freq: Once | INTRAMUSCULAR | Status: AC
  Administered 2017-07-08: 30 mg via INTRAVENOUS

## 2017-07-08 MED ORDER — ROCURONIUM BROMIDE 100 MG/10ML IV SOLN
INTRAVENOUS | Status: DC | PRN
  Administered 2017-07-08: 25 mg via INTRAVENOUS
  Administered 2017-07-08: 5 mg via INTRAVENOUS

## 2017-07-08 MED ORDER — LACTATED RINGERS IV SOLN
INTRAVENOUS | Status: DC
Start: 2017-07-08 — End: 2017-07-08

## 2017-07-08 MED ORDER — PROPOFOL 10 MG/ML IV BOLUS
INTRAVENOUS | Status: DC | PRN
  Administered 2017-07-08: 30 mg via INTRAVENOUS
  Administered 2017-07-08: 20 mg via INTRAVENOUS
  Administered 2017-07-08: 150 mg via INTRAVENOUS

## 2017-07-08 MED ORDER — HYDROCODONE-ACETAMINOPHEN 7.5-325 MG PO TABS: 1.0000 | ORAL_TABLET | Freq: Four times a day (QID) | ORAL | 0 refills | Status: DC | PRN

## 2017-07-08 MED ORDER — BUPIVACAINE HCL (PF) 0.5 % IJ SOLN
INTRAMUSCULAR | Status: DC | PRN
  Administered 2017-07-08: 10 mL

## 2017-07-08 MED ORDER — BUPIVACAINE HCL (PF) 0.5 % IJ SOLN
INTRAMUSCULAR | Status: AC
  Filled 2017-07-08: qty 30

## 2017-07-08 MED ORDER — CHLORHEXIDINE GLUCONATE CLOTH 2 % EX PADS: 6.0000 | MEDICATED_PAD | Freq: Once | CUTANEOUS | Status: DC

## 2017-07-08 MED ORDER — LABETALOL HCL 5 MG/ML IV SOLN
INTRAVENOUS | Status: AC
  Filled 2017-07-08: qty 4

## 2017-07-08 MED ORDER — POVIDONE-IODINE 10 % OINT PACKET
TOPICAL_OINTMENT | CUTANEOUS | Status: DC | PRN
  Administered 2017-07-08: 1 via TOPICAL

## 2017-07-08 MED ORDER — FENTANYL CITRATE (PF) 250 MCG/5ML IJ SOLN
INTRAMUSCULAR | Status: AC
  Filled 2017-07-08: qty 5

## 2017-07-08 MED ORDER — KETOROLAC TROMETHAMINE 30 MG/ML IJ SOLN
INTRAMUSCULAR | Status: AC
  Filled 2017-07-08: qty 1

## 2017-07-08 MED ORDER — METRONIDAZOLE IN NACL 5-0.79 MG/ML-% IV SOLN
500.0000 mg | Freq: Three times a day (TID) | INTRAVENOUS | Status: DC
  Administered 2017-07-08 (×2): 500 mg via INTRAVENOUS
  Filled 2017-07-08 (×2): qty 100

## 2017-07-08 MED ORDER — PANTOPRAZOLE SODIUM 40 MG IV SOLR
INTRAVENOUS | Status: AC
  Filled 2017-07-08: qty 40

## 2017-07-08 MED ORDER — HEMOSTATIC AGENTS (NO CHARGE) OPTIME
TOPICAL | Status: DC | PRN
Start: 2017-07-08 — End: 2017-07-08
  Administered 2017-07-08: 1 via TOPICAL

## 2017-07-08 MED ORDER — POTASSIUM CHLORIDE IN NACL 20-0.9 MEQ/L-% IV SOLN
INTRAVENOUS | Status: DC
  Administered 2017-07-08: 02:00:00 via INTRAVENOUS
  Filled 2017-07-08: qty 1000

## 2017-07-08 MED ORDER — 0.9 % SODIUM CHLORIDE (POUR BTL) OPTIME
TOPICAL | Status: DC | PRN
  Administered 2017-07-08: 1000 mL

## 2017-07-08 MED ORDER — GLYCOPYRROLATE 0.2 MG/ML IJ SOLN
INTRAMUSCULAR | Status: AC
  Filled 2017-07-08: qty 1

## 2017-07-08 MED ORDER — HYDROMORPHONE HCL 1 MG/ML IJ SOLN
0.5000 mg | Freq: Once | INTRAMUSCULAR | Status: AC
  Administered 2017-07-08: 0.5 mg via INTRAVENOUS
  Filled 2017-07-08: qty 1

## 2017-07-08 MED ORDER — MIDAZOLAM HCL 2 MG/2ML IJ SOLN
INTRAMUSCULAR | Status: AC
  Filled 2017-07-08: qty 2

## 2017-07-08 MED ORDER — HYDROMORPHONE HCL 1 MG/ML IJ SOLN: 0.2500 mg | INTRAMUSCULAR | Status: DC | PRN

## 2017-07-08 SURGICAL SUPPLY — 49 items
APPLICATOR ARISTA FLEXITIP XL (MISCELLANEOUS) ×3 IMPLANT
APPLIER CLIP ROT 10 11.4 M/L (STAPLE) ×3
BAG HAMPER (MISCELLANEOUS) ×3 IMPLANT
BAG RETRIEVAL 10 (BASKET) ×1
BAG RETRIEVAL 10MM (BASKET) ×1
CHLORAPREP W/TINT 26ML (MISCELLANEOUS) ×3 IMPLANT
CLIP APPLIE ROT 10 11.4 M/L (STAPLE) ×1 IMPLANT
CLOTH BEACON ORANGE TIMEOUT ST (SAFETY) ×3 IMPLANT
COVER LIGHT HANDLE STERIS (MISCELLANEOUS) ×6 IMPLANT
CUTTER FLEX LINEAR 45M (STAPLE) ×3 IMPLANT
DECANTER SPIKE VIAL GLASS SM (MISCELLANEOUS) ×3 IMPLANT
ELECT REM PT RETURN 9FT ADLT (ELECTROSURGICAL) ×3
ELECTRODE REM PT RTRN 9FT ADLT (ELECTROSURGICAL) ×1 IMPLANT
FILTER SMOKE EVAC LAPAROSHD (FILTER) ×3 IMPLANT
GLOVE BIO SURGEON STRL SZ 6.5 (GLOVE) ×2 IMPLANT
GLOVE BIO SURGEONS STRL SZ 6.5 (GLOVE) ×1
GLOVE BIOGEL PI IND STRL 6.5 (GLOVE) ×2 IMPLANT
GLOVE BIOGEL PI INDICATOR 6.5 (GLOVE) ×4
GLOVE ECLIPSE 6.5 STRL STRAW (GLOVE) ×3 IMPLANT
GLOVE SURG SS PI 7.5 STRL IVOR (GLOVE) ×3 IMPLANT
GOWN STRL REUS W/ TWL XL LVL3 (GOWN DISPOSABLE) ×1 IMPLANT
GOWN STRL REUS W/TWL LRG LVL3 (GOWN DISPOSABLE) ×6 IMPLANT
GOWN STRL REUS W/TWL XL LVL3 (GOWN DISPOSABLE) ×2
HEMOSTAT ARISTA ABSORB 3G PWDR (MISCELLANEOUS) ×3 IMPLANT
HEMOSTAT SNOW SURGICEL 2X4 (HEMOSTASIS) ×3 IMPLANT
INST SET LAPROSCOPIC AP (KITS) ×3 IMPLANT
KIT ROOM TURNOVER APOR (KITS) ×3 IMPLANT
MANIFOLD NEPTUNE II (INSTRUMENTS) ×3 IMPLANT
NEEDLE INSUFFLATION 14GA 120MM (NEEDLE) ×3 IMPLANT
NS IRRIG 1000ML POUR BTL (IV SOLUTION) ×3 IMPLANT
PACK LAP CHOLE LZT030E (CUSTOM PROCEDURE TRAY) ×3 IMPLANT
PAD ARMBOARD 7.5X6 YLW CONV (MISCELLANEOUS) ×3 IMPLANT
RELOAD 45 VASCULAR/THIN (ENDOMECHANICALS) ×3 IMPLANT
SET BASIN LINEN APH (SET/KITS/TRAYS/PACK) ×3 IMPLANT
SLEEVE ENDOPATH XCEL 5M (ENDOMECHANICALS) ×3 IMPLANT
SPONGE GAUZE 2X2 8PLY STER LF (GAUZE/BANDAGES/DRESSINGS) ×1
SPONGE GAUZE 2X2 8PLY STRL LF (GAUZE/BANDAGES/DRESSINGS) ×2 IMPLANT
STAPLER VISISTAT (STAPLE) ×3 IMPLANT
SUT VICRYL 0 UR6 27IN ABS (SUTURE) ×3 IMPLANT
SYS BAG RETRIEVAL 10MM (BASKET) ×1
SYSTEM BAG RETRIEVAL 10MM (BASKET) ×1 IMPLANT
TAPE CLOTH SURG 4X10 WHT LF (GAUZE/BANDAGES/DRESSINGS) ×3 IMPLANT
TROCAR ENDO BLADELESS 11MM (ENDOMECHANICALS) ×3 IMPLANT
TROCAR XCEL NON-BLD 5MMX100MML (ENDOMECHANICALS) ×3 IMPLANT
TROCAR XCEL UNIV SLVE 11M 100M (ENDOMECHANICALS) ×3 IMPLANT
TUBE CONNECTING 12'X1/4 (SUCTIONS) ×1
TUBE CONNECTING 12X1/4 (SUCTIONS) ×2 IMPLANT
TUBING INSUFFLATION (TUBING) ×3 IMPLANT
WARMER LAPAROSCOPE (MISCELLANEOUS) ×3 IMPLANT

## 2017-07-08 NOTE — Op Note (Signed)
Patient:  Luke [name]  DOB:  February 19, 1951  MRN:  [id number]   Preop Diagnosis: Acute cholecystitis, cholelithiasis  Postop Diagnosis: Same  Procedure: Laparoscopic cholecystectomy  Surgeon: Aviva Signs, MD  Assistant: Blake Divine, MD  Anes: General endotracheal  Indications: Patient is a 68 year old white male who presented to the emergency room with right upper quadrant abdominal pain.  He was found to have acute cholecystitis secondary to cholelithiasis.  The risks and benefits of the procedure including bleeding, infection, hepatobiliary injury, and the possibility of an open procedure were fully explained to the patient, who gave informed consent.  Procedure note: The patient was placed in the supine position.  After induction of general endotracheal anesthesia, the abdomen was prepped and draped using the usual sterile technique with DuraPrep.  Surgical site confirmation was performed.  Supraumbilical incision was made down to the fascia.  A Veress needle was introduced into the abdominal cavity and confirmation of placement was done using the saline drop test.  The abdomen was then insufflated to 16 mmHg pressure.  An 11 mm trocar was introduced into the abdominal cavity under direct visualization without difficulty.  The patient was placed in reverse Trendelenburg position and an additional 11 mm trocar was placed in the epigastric region and 5 mm trochars were placed in the right upper quadrant right flank regions.  The liver was inspected and noted to be within normal limits.  There was evidence of chronic inflammation and adhesions of omentum and bowel and around the gallbladder and liver edge.  These were freed away bluntly without difficulty.  The gallbladderwas taken down to the infundibulum with dynamic retraction.  A critical view was performed at the triangle of Calot.  The cystic artery was first identified.  Endoclips were placed proximally and distally on the cystic artery,  and the cystic artery was divided.  The cystic duct was fully identified from its juncture to the infundibulum down to the common bile duct.  It was divided using a vascular Endo GIA.  The gallbladder was then freed away from the gallbladder fossa using Bovie electrocautery.  The gallbladder was delivered to the epigastric trocar site using an Endo Catch bag.  The gallbladder fossa was inspected and no abnormal bleeding or bile leakage was noted.  Arista and Surgicel were placed in the gallbladder fossa.  Of fluid and air were then evacuated from the abdominal cavity prior to the removal of the trochars.  All wounds were irrigated with normal saline.  All wounds were injected with 0.5% Sensorcaine.  The skin incisions were closed using staples.  Betadine ointment and dry sterile dressings were applied.  All tape and needle counts were correct at the end of the procedure.  Patient was extubated in the operating room and transferred to PACU in stable condition.  Complications: None  EBL: Minimal  Specimen: Gallbladder

## 2017-07-08 NOTE — ED Notes (Signed)
Dr. Arnoldo Morale in to see patient at this time

## 2017-07-08 NOTE — Anesthesia Procedure Notes (Signed)
Procedure Name: Intubation Date/Time: 07/08/2017 11:14 AM Performed by: Vista Deck, CRNA Pre-anesthesia Checklist: Patient identified, Patient being monitored, Timeout performed, Emergency Drugs available and Suction available Patient Re-evaluated:Patient Re-evaluated prior to induction Oxygen Delivery Method: Circle System Utilized Preoxygenation: Pre-oxygenation with 100% oxygen Induction Type: IV induction, Rapid sequence and Cricoid Pressure applied Laryngoscope Size: Mac and 3 Grade View: Grade II Tube type: Oral Tube size: 7.0 mm Number of attempts: 1 Airway Equipment and Method: stylet Placement Confirmation: ETT inserted through vocal cords under direct vision,  positive ETCO2 and breath sounds checked- equal and bilateral Secured at: 21 cm Tube secured with: Tape Dental Injury: Teeth and Oropharynx as per pre-operative assessment

## 2017-07-08 NOTE — H&P (Signed)
Luke [name] is an 67 y.o. male.   Chief Complaint: Right upper quadrant abdominal pain HPI: Patient is a 67 year old white male who presented in the emergency room after eating to ham and cheese sandwiches complaining of right upper quadrant abdominal pain.  He has similar episode at Thanksgiving in 2018.  He was told at that time he had cholelithiasis.  He had been doing well up until yesterday evening.  He did have some nausea and right upper quadrant abdominal pain.  He presented to the emergency room and was found on CT scan of the abdomen to have acute cholecystitis with cholelithiasis.  His liver enzyme tests are within normal limits.  He does feel better with pain medication.  He currently has a pain level of 2 out of 10.  Past Medical History:  Diagnosis Date  . CAD (coronary artery disease) 10/02/2010   nuclear study show normal perfusion on medical therapy without scar or ischemia. EF 64%  . Diabetes mellitus ([place])    type 2   . GERD (gastroesophageal reflux disease)   . Heart murmur   . Hematochezia 11/19/2009  . Hyperlipidemia   . Hypertension   . Iron deficiency anemia 11/19/2009  . Osteoarthritis    left knee  . 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 Sleep apnea    study 20 yrs ago. no cpap used now    Past Surgical History:  Procedure Laterality Date  . CARDIAC CATHETERIZATION  08/1999   which revealed a mild coronary artery disease with 60 and 70 and 80% stenosis in a small first diagonal vessel at the LAD, 50% mid LAD stenosis, 50% ostial left circumflrx stenosis, and 40% ostial and proximal intermediate stenosis. he had 10 to 20% irregularities of his mid right coronary artery.  Marland Kitchen CIRCUMCISION  07/17/2012   Procedure: CIRCUMCISION ADULT;  Surgeon: Dutch Gray, MD;  Location: [facility];  Service: Urology;  Laterality: N/A;  . COLONOSCOPY  02/14/06   M Maisen Klingler  . COLONOSCOPY N/A 10/28/2014   Procedure: COLONOSCOPY;  Surgeon: Rogene Houston, MD;  Location: [facility];   Service: Endoscopy;  Laterality: N/A;  730  . ENTEROSCOPY N/A 10/04/2014   Procedure: ENTEROSCOPY;  Surgeon: Carol Ada, MD;  Location: [facility] END[facility];  Service: Endoscopy;  Laterality: N/A;  . ERCP  12/25/1996   ROURK  . GIVENS CAPSULE STUDY N/A 10/04/2014   Procedure: GIVENS CAPSULE STUDY;  Surgeon: Carol Ada, MD;  Location: [place];  Service: Endoscopy;  Laterality: N/A;  . JOINT REPLACEMENT  07-12-12   2008/2007 -knee replacements  . SMALL BOWEL GIVENS  11/25/2008  . UPPER GASTROINTESTINAL ENDOSCOPY  11/08/2008   EGD TCS    Family History  Problem Relation Age of Onset  . Healthy Sister   . Healthy Brother   . Healthy Sister   . Healthy Brother   . Healthy Brother   . Healthy Daughter   . Healthy Daughter    Social History:  reports that he quit smoking about 2 years ago. His smoking use included cigarettes. He has a 50.00 pack-year smoking history. he has never used smokeless tobacco. He reports that he does not drink alcohol or use drugs.  Allergies:  Allergies  Allergen Reactions  . Morphine And Related Itching     (Not in a hospital admission)  Results for orders placed or performed during the hospital encounter of 07/07/17 (from the past 48 hour(s))  Lipase, blood     Status: None   Collection Time: 07/07/17 10:32  PM  Result Value Ref Range   Lipase 18 11 - 51 U/L  Comprehensive metabolic panel     Status: Abnormal   Collection Time: 07/07/17 10:32 PM  Result Value Ref Range   Sodium 135 135 - 145 mmol/L   Potassium 3.5 3.5 - 5.1 mmol/L   Chloride 98 (L) 101 - 111 mmol/L   CO2 24 22 - 32 mmol/L   Glucose, Bld 265 (H) 65 - 99 mg/dL   BUN 12 6 - 20 mg/dL   Creatinine, Ser 0.89 0.61 - 1.24 mg/dL   Calcium 9.2 8.9 - 10.3 mg/dL   Total Protein 6.8 6.5 - 8.1 g/dL   Albumin 3.9 3.5 - 5.0 g/dL   AST 20 15 - 41 U/L   ALT 17 17 - 63 U/L   Alkaline Phosphatase 69 38 - 126 U/L   Total Bilirubin 0.4 0.3 - 1.2 mg/dL   GFR calc non Af Amer >60 >60 mL/min   GFR  calc Af Amer >60 >60 mL/min    Comment: (NOTE) The eGFR has been calculated using the CKD EPI equation. This calculation has not been validated in all clinical situations. eGFR's persistently <60 mL/min signify possible Chronic Kidney Disease.    Anion gap 13 5 - 15  CBC     Status: Abnormal   Collection Time: 07/07/17 10:32 PM  Result Value Ref Range   WBC 7.9 4.0 - 10.5 K/uL   RBC 4.43 4.22 - 5.81 MIL/uL   Hemoglobin 11.1 (L) 13.0 - 17.0 g/dL   HCT 36.5 (L) 39.0 - 52.0 %   MCV 82.4 78.0 - 100.0 fL   MCH 25.1 (L) 26.0 - 34.0 pg   MCHC 30.4 30.0 - 36.0 g/dL   RDW 15.6 (H) 11.5 - 15.5 %   Platelets 322 150 - 400 K/uL  Urinalysis, Routine w reflex microscopic     Status: Abnormal   Collection Time: 07/07/17 10:32 PM  Result Value Ref Range   Color, Urine STRAW (A) YELLOW   APPearance CLEAR CLEAR   Specific Gravity, Urine 1.028 1.005 - 1.030   pH 6.0 5.0 - 8.0   Glucose, UA >=500 (A) NEGATIVE mg/dL   Hgb urine dipstick NEGATIVE NEGATIVE   Bilirubin Urine NEGATIVE NEGATIVE   Ketones, ur NEGATIVE NEGATIVE mg/dL   Protein, ur NEGATIVE NEGATIVE mg/dL   Nitrite NEGATIVE NEGATIVE   Leukocytes, UA NEGATIVE NEGATIVE   RBC / HPF 0-5 0 - 5 RBC/hpf   WBC, UA 0-5 0 - 5 WBC/hpf   Bacteria, UA NONE SEEN NONE SEEN   Squamous Epithelial / LPF NONE SEEN NONE SEEN  Magnesium     Status: None   Collection Time: 07/08/17  1:00 AM  Result Value Ref Range   Magnesium 1.7 1.7 - 2.4 mg/dL   Ct Abdomen Pelvis W Contrast  Result Date: 07/08/2017 CLINICAL DATA:  67 y/o M; generalized abdominal pain with 2 episodes of vomiting. EXAM: CT ABDOMEN AND PELVIS WITH CONTRAST TECHNIQUE: Multidetector CT imaging of the abdomen and pelvis was performed using the standard protocol following bolus administration of intravenous contrast. CONTRAST:  166m ISOVUE-300 IOPAMIDOL (ISOVUE-300) INJECTION 61% COMPARISON:  06/15/2017 abdominal ultrasound. FINDINGS: Lower chest: No acute abnormality. Hepatobiliary: No focal  liver lesion. Gallbladder wall thickening and pericholecystic fluid with 7 mm stone at the gallbladder neck (series 2, image 24). No intrahepatic biliary ductal dilatation. Common bile duct measures up to 10 mm, no obstructing stone or mass identified. Pancreas: Unremarkable. No pancreatic ductal dilatation or  surrounding inflammatory changes. Spleen: Normal in size without focal abnormality. Adrenals/Urinary Tract: Adrenal glands are unremarkable. Kidneys are normal, without renal calculi, focal lesion, or hydronephrosis. Bladder is unremarkable. 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 No obstructive or inflammatory changes of stomach, small bowel, or large bowel. Normal appendix. Vascular/Lymphatic: Aortic atherosclerosis. No enlarged abdominal or pelvic lymph nodes. Reproductive: Moderate prostate enlargement. Other: No abdominal wall hernia or abnormality. No abdominopelvic ascites. Musculoskeletal: Mild lumbar spine spondylosis. Grade 1 L4-5 anterolisthesis without pars defect. No acute fracture. IMPRESSION: 1. Gallbladder wall thickening, pericholecystic fluid, and 7 mm gallstone within gallbladder neck. Findings are compatible with acute cholecystitis. 2. Moderate prostate enlargement. 3. 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 4. Mild lumbar spine spondylosis. Electronically Signed   By: LKristine GarbeM.D.   On: 07/08/2017 00:36    Review of Systems  Constitutional: Positive for malaise/fatigue.  HENT: Negative.   Eyes: Negative.   Respiratory: Negative.   Cardiovascular: Negative.   Gastrointestinal: Positive for abdominal pain and nausea.  Genitourinary: Negative.   Musculoskeletal: Negative.   Skin: Negative.   Neurological: Negative.   Endo/Heme/Allergies: Negative.   Psychiatric/Behavioral: Negative.     Blood pressure 140/67, pulse 61, temperature 97.9 F (36.6 C), temperature source Oral, resp. rate (!) 21, height '5\' 5"'   (1.651 m), weight 175 lb (79.4 kg), SpO2 98 %. Physical Exam  Vitals reviewed. Constitutional: He is oriented to person, place, and time. He appears well-developed and well-nourished.  HENT:  Head: Normocephalic and atraumatic.  Eyes: No scleral icterus.  Cardiovascular: Normal rate and regular rhythm. Exam reveals no gallop and no friction rub.  No murmur heard. Respiratory: Effort normal and breath sounds normal. No respiratory distress. He has no wheezes. He has no rales.  GI: Soft. Bowel sounds are normal. He exhibits no distension. There is tenderness. There is no rebound.  Tender in the right upper quadrant to palpation  Neurological: He is alert and oriented to person, place, and time.  Skin: Skin is warm and dry.     Assessment/Plan Impression: Cholecystitis, cholelithiasis Plan: Patient will be taken to the operating room for laparoscopic cholecystectomy.  The risks and benefits of the procedure including bleeding, infection, hepatobiliary injury, and the possibility of an open procedure were fully explained to the patient, who gave informed consent.  MAviva Signs MD 07/08/2017, 10:54 AM

## 2017-07-08 NOTE — H&P (Signed)
History and Physical    Luke [name]:[id number] DOB: 07/07/1950 DOA: 07/07/2017  PCP: Celene Squibb, MD   Patient coming from: Home.  I have personally briefly reviewed patient's old medical records in [place]  Chief Complaint: Abdominal pain and vomiting.  HPI: Luke [name] is a 67 y.o. male with medical history significant of CAD, type 2 diabetes, GERD, heart murmur, hematochezia, hyperlipidemia, hypertension, iron deficiency anemia, left knee osteoarthritis, sleep apnea (not on CPAP), recurrent pancreatitis who is coming to the emergency department with complaints of progressively worse abdominal pain associated with nausea and 3 episodes of emesis since yesterday evening.  Per patient, he had a similar episode of pain around Thanksgiving, but it was not as intense as the pain from last evening.  Since then until now, he denies having any significant GI discomfort.  He was in his normal state of health until last evening.  He mentions that he ate cheese and ham for dinner.  Then, while after he finished eating dinner, he developed right flank pain that slowly progressed to his RUQ, associated with nausea and 3 episodes of emesis.  He denies fever, chills, diarrhea, constipation, melena or hematochezia, dysuria, frequency or hematuria.  Denies sore throat, dyspnea, wheezing, hemoptysis, chest pain, palpitations, dizziness, diaphoresis, PND, orthopnea or pitting edema of the lower extremities.  He states that his blood glucose has been under control and denies polyphagia, poorly hypoxia, polyuria or blurred vision.  Denies itching or skin rashes.  ED Course: Initial vital signs in the emergency department show a temperature 36.5C (97.7 F), pulse 60, respirations 20, blood  pressure 197/137 mmHg and O2 sat 95% room air.  His urinalysis showed glucosuria more than 500 mg/dL, but was otherwise unremarkable.  CBC showed a white count of 7.9, hemoglobin 11.1 g/dL and platelets 322.   CMP shows sodium 135, potassium 3.5, chloride 98 and CO2 24 millimolar/L.  Glucose was 265, calcium 9.2, magnesium 1.7, BUN 12 and creatinine 0.89 mg/dL.  LFTs are within normal limits.  Lipase was 18 U/L.  Imaging  CT abdomen/pelvis with contrast shows: 1. Gallbladder wall thickening, pericholecystic fluid, and 7 mm gallstone within gallbladder neck. Findings are compatible with acute cholecystitis. 2. Moderate prostate enlargement. 3. 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 4. Mild lumbar spine spondylosis.  Treatment in the ED  The patient received a 1000 mL normal saline bolus, Zofran 4 mg IVP x1, hydromorphone 0.5 mg IVP x1 and ceftriaxone 2 g IVPB.  I added NS with KCl 20 mEq at 125 mL/hr, Protonix 40 mg IVP, ketorolac 30 mg IVP, magnesium sulfate 2 g IVPB and Flagyl 500 mg IVPB every 8 hours.  Review of Systems: As per HPI otherwise 10 point review of systems negative.    Past Medical History:  Diagnosis Date  . CAD (coronary artery disease) 10/02/2010   nuclear study show normal perfusion on medical therapy without scar or ischemia. EF 64%  . Diabetes mellitus ([place])    type 2   . GERD (gastroesophageal reflux disease)   . Heart murmur   . Hematochezia 11/19/2009  . Hyperlipidemia   . Hypertension   . Iron deficiency anemia 11/19/2009  . Osteoarthritis    left knee  . 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 Sleep apnea    study 20 yrs ago. no cpap used now    Past Surgical History:  Procedure Laterality Date  . CARDIAC CATHETERIZATION  08/1999   which  revealed a mild coronary artery disease with 60 and 70 and 80% stenosis in a small first diagonal vessel at the LAD, 50% mid LAD stenosis, 50% ostial left circumflrx stenosis, and 40% ostial and proximal intermediate stenosis. he had 10 to 20% irregularities of his mid right coronary artery.  Marland Kitchen CIRCUMCISION  07/17/2012   Procedure: CIRCUMCISION ADULT;  Surgeon: Dutch Gray, MD;  Location: [facility];   Service: Urology;  Laterality: N/A;  . COLONOSCOPY  02/14/06   M JENKINS  . COLONOSCOPY N/A 10/28/2014   Procedure: COLONOSCOPY;  Surgeon: Rogene Houston, MD;  Location: [facility];  Service: Endoscopy;  Laterality: N/A;  730  . ENTEROSCOPY N/A 10/04/2014   Procedure: ENTEROSCOPY;  Surgeon: Carol Ada, MD;  Location: [facility] ENDOSCOPY;  Service: Endoscopy;  Laterality: N/A;  . ERCP  12/25/1996   ROURK  . GIVENS CAPSULE STUDY N/A 10/04/2014   Procedure: GIVENS CAPSULE STUDY;  Surgeon: Carol Ada, MD;  Location: [place];  Service: Endoscopy;  Laterality: N/A;  . JOINT REPLACEMENT  07-12-12   2008/2007 -knee replacements  . SMALL BOWEL GIVENS  11/25/2008  . UPPER GASTROINTESTINAL ENDOSCOPY  11/08/2008   EGD TCS     reports that he quit smoking about 2 years ago. His smoking use included cigarettes. He has a 50.00 pack-year smoking history. he has never used smokeless tobacco. He reports that he does not drink alcohol or use drugs.  Allergies  Allergen Reactions  . Morphine And Related Itching    Family History  Problem Relation Age of Onset  . Healthy Sister   . Healthy Brother   . Healthy Sister   . Healthy Brother   . Healthy Brother   . Healthy Daughter   . Healthy Daughter     Prior to Admission medications   Medication Sig Start Date End Date Taking? Authorizing Provider  aspirin 81 MG tablet Take 1 tablet (81 mg total) by mouth daily. HOLD FOR 1 WEEK, THEN RESUME IF NO BLEEDING PROBLEMS Patient taking differently: Take 81 mg by mouth daily.  10/05/14   Delfina Redwood, MD  atorvastatin (LIPITOR) 40 MG tablet TAKE 1 TABLET EVERY DAY 04/14/17   Troy Sine, MD  bisoprolol-hydrochlorothiazide [facility]) 2.5-6.25 MG tablet TAKE 1 TABLET EVERY DAY 05/27/17   Troy Sine, MD  cetirizine (ZYRTEC) 10 MG tablet Take 10 mg by mouth daily.    [provider]  empagliflozin (JARDIANCE) 10 MG TABS tablet Take 10 mg by mouth daily.    [provider]    ferrous sulfate 325 (65 FE) MG EC tablet Take 1 tablet (325 mg total) by mouth 2 (two) times daily after a meal. Patient taking differently: Take 325 mg by mouth daily with breakfast. Once a day 10/28/14   Rogene Houston, MD  finasteride (PROSCAR) 5 MG tablet Take 5 mg by mouth daily.    [provider]  Insulin Glargine-Lixisenatide (SOLIQUA) 100-33 UNT-MCG/ML SOPN Inject 1 Units into the skin daily.    [provider]  loperamide (IMODIUM) 2 MG capsule Take 1 capsule (2 mg total) by mouth daily before breakfast. 02/18/15   Rehman, Mechele Dawley, MD  metFORMIN (GLUMETZA) 1000 MG (MOD) 24 hr tablet Take 1,000 mg by mouth 2 (two) times daily.     [provider]  pantoprazole (PROTONIX) 40 MG tablet TAKE 1 TABLET EVERY DAY 05/27/17   Rehman, Mechele Dawley, MD  ramipril (ALTACE) 10 MG capsule TAKE 1 CAPSULE EVERY DAY 04/14/17   Claiborne Billings,  Joyice Faster, MD  tamsulosin (FLOMAX) 0.4 MG CAPS capsule Take 0.4 mg by mouth.    [provider]    Physical Exam: Vitals:   07/07/17 2230 07/07/17 2300 07/08/17 0049 07/08/17 0100  BP: (!) 197/137 (!) 199/78 (!) 181/74 (!) 163/105  Pulse: 60 (!) 57 (!) 51 69  Resp: 20   18  Temp: 97.7 F (36.5 C)     TempSrc: Oral     SpO2: 95% 93% 96% 99%  Weight:      Height:        Constitutional: NAD, calm, comfortable Eyes: PERRL, lids and conjunctivae normal ENMT: Mucous membranes are moist. Posterior pharynx clear of any exudate or lesions. Neck: normal, supple, no masses, no thyromegaly Respiratory: Decreased breath sounds on bases, otherwise clear to auscultation bilaterally, no wheezing, no crackles. Normal respiratory effort. No accessory muscle use.  Cardiovascular: Regular rate and rhythm, 1/6 systolic murmur, no rubs / gallops. No lower extremity edema. 2+ pedal pulses. No carotid bruits.  Abdomen: Nondistended.  Positive BS, soft, positive RUQ and epigastric tenderness, no masses palpated. No hepatosplenomegaly. Bowel sounds positive.   Musculoskeletal: no clubbing / cyanosis. Good ROM, no contractures. Normal muscle tone.  Skin: mild periumbilical erythema, otherwise no significant rashes, lesions, ulcers on limited skin exam. Neurologic: CN 2-12 grossly intact. Sensation intact, DTR normal. Strength 5/5 in all 4.  Psychiatric: Normal judgment and insight. Alert and oriented x 4. Normal mood.    Labs on Admission: I have personally reviewed following labs and imaging studies  CBC: Recent Labs  Lab 07/07/17 0915 07/07/17 2232  WBC 6.5 7.9  NEUTROABS 4.1  --   HGB 11.8* 11.1*  HCT 39.5 36.5*  MCV 83.5 82.4  PLT 302 951   Basic Metabolic Panel: Recent Labs  Lab 07/07/17 2232 07/08/17 0100  NA 135  --   K 3.5  --   CL 98*  --   CO2 24  --   GLUCOSE 265*  --   BUN 12  --   CREATININE 0.89  --   CALCIUM 9.2  --   MG  --  1.7   GFR: Estimated Creatinine Clearance: 78.3 mL/min (by C-G formula based on SCr of 0.89 mg/dL). Liver Function Tests: Recent Labs  Lab 07/07/17 2232  AST 20  ALT 17  ALKPHOS 69  BILITOT 0.4  PROT 6.8  ALBUMIN 3.9   Recent Labs  Lab 07/07/17 2232  LIPASE 18   No results for input(s): AMMONIA in the last 168 hours. Coagulation Profile: No results for input(s): INR, PROTIME in the last 168 hours. Cardiac Enzymes: No results for input(s): CKTOTAL, CKMB, CKMBINDEX, TROPONINI in the last 168 hours. BNP (last 3 results) No results for input(s): PROBNP in the last 8760 hours. HbA1C: No results for input(s): HGBA1C in the last 72 hours. CBG: No results for input(s): GLUCAP in the last 168 hours. Lipid Profile: No results for input(s): CHOL, HDL, LDLCALC, TRIG, CHOLHDL, LDLDIRECT in the last 72 hours. Thyroid Function Tests: No results for input(s): TSH, T4TOTAL, FREET4, T3FREE, THYROIDAB in the last 72 hours. Anemia Panel: No results for input(s): VITAMINB12, FOLATE, FERRITIN, TIBC, IRON, RETICCTPCT in the last 72 hours. Urine analysis:    Component Value Date/Time    COLORURINE STRAW (A) 07/07/2017 2232   APPEARANCEUR CLEAR 07/07/2017 2232   LABSPEC 1.028 07/07/2017 2232   PHURINE 6.0 07/07/2017 2232   GLUCOSEU >=500 (A) 07/07/2017 2232   HGBUR NEGATIVE 07/07/2017 2232   BILIRUBINUR NEGATIVE 07/07/2017  [place] 07/07/2017 2232   PROTEINUR NEGATIVE 07/07/2017 2232   UROBILINOGEN 0.2 06/26/2007 1440   NITRITE NEGATIVE 07/07/2017 2232   LEUKOCYTESUR NEGATIVE 07/07/2017 2232    Radiological Exams on Admission: Ct Abdomen Pelvis W Contrast  Result Date: 07/08/2017 CLINICAL DATA:  67 y/o M; generalized abdominal pain with 2 episodes of vomiting. EXAM: CT ABDOMEN AND PELVIS WITH CONTRAST TECHNIQUE: Multidetector CT imaging of the abdomen and pelvis was performed using the standard protocol following bolus administration of intravenous contrast. CONTRAST:  148mL ISOVUE-300 IOPAMIDOL (ISOVUE-300) INJECTION 61% COMPARISON:  06/15/2017 abdominal ultrasound. FINDINGS: Lower chest: No acute abnormality. Hepatobiliary: No focal liver lesion. Gallbladder wall thickening and pericholecystic fluid with 7 mm stone at the gallbladder neck (series 2, image 24). No intrahepatic biliary ductal dilatation. Common bile duct measures up to 10 mm, no obstructing stone or mass identified. Pancreas: Unremarkable. No pancreatic ductal dilatation or surrounding inflammatory changes. Spleen: Normal in size without focal abnormality. Adrenals/Urinary Tract: Adrenal glands are unremarkable. Kidneys are normal, without renal calculi, focal lesion, or hydronephrosis. Bladder is unremarkable. 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 No obstructive or inflammatory changes of stomach, small bowel, or large bowel. Normal appendix. Vascular/Lymphatic: Aortic atherosclerosis. No enlarged abdominal or pelvic lymph nodes. Reproductive: Moderate prostate enlargement. Other: No abdominal wall hernia or abnormality. No abdominopelvic ascites.  Musculoskeletal: Mild lumbar spine spondylosis. Grade 1 L4-5 anterolisthesis without pars defect. No acute fracture. IMPRESSION: 1. Gallbladder wall thickening, pericholecystic fluid, and 7 mm gallstone within gallbladder neck. Findings are compatible with acute cholecystitis. 2. Moderate prostate enlargement. 3. 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 4. Mild lumbar spine spondylosis. Electronically Signed   By: Kristine Garbe M.D.   On: 07/08/2017 00:36    EKG: Independently reviewed. Vent. rate 70 BPM PR interval * ms QRS duration 90 ms QT/QTc 415/448 ms P-R-T axes 72 74 88 Age not entered, assumed to be 67 years old for purpose of ECG interpretation Sinus rhythm Nonspecific T abnormalities, lateral leads No previous ECG to compare to.  Assessment/Plan Principal Problem:   Acute cholecystitis Admit to Med-Surg/inpatient. Keep n.p.o. Continue IV fluids. Continue analgesics as needed. Continue antiemetics as needed. Pantoprazole 40 mg IVP every 24 hours. General surgery to evaluate later today.  Active Problems:   GERD (gastroesophageal reflux disease) Protonix 40 mg IVP every 24 hours. Switch back to oral formulation once clear for oral intake.    IDA (iron deficiency anemia) Monitor hematocrit and hemoglobin. Hold ferrous sulfate supplementation while n.p.o.    DM type 2 (diabetes mellitus, type 2) (HCC) Last hemoglobin A1c was 9.0% in September 2017 Hold Soliqua insulin preparation while n.p.o. CBG monitoring with regular insulin sliding scale.    HTN (hypertension) Hold oral antihypertensive medication. We will use IV metoprolol and enalapril. Monitor blood pressure, renal function and electrolytes.    Hyperlipemia Hold atorvastatin while n.p.o.    CAD (coronary artery disease) Non-obstructive in 2001 cath and treated medically without symptomatology. He had a nuclear perfusion study in April 2012 that showed normal perfusion. He last  followed with Dr. Claiborne Billings in October. Continue beta-blocker IV. Aspirin held before possible surgery. Hold atorvastatin while n.p.o. Check echocardiogram later today.   DVT prophylaxis: SCDs. Code Status: Full code. Family Communication:  Disposition Plan: Admit for IV antibiotic therapy, symptoms control and general surgery evaluation. Consults called: Routine general surgery consult. Admission status: Inpatient/MedSurg.   Reubin Milan MD [facility] Pager [phone].  If 7PM-7AM, please  contact night-coverage [email or web address] Password [email or web address]  07/08/2017, 1:27 AM

## 2017-07-08 NOTE — Discharge Instructions (Signed)
Laparoscopic Cholecystectomy, Care After °This sheet gives you information about how to care for yourself after your procedure. Your health care provider may also give you more specific instructions. If you have problems or questions, contact your health care provider. °What can I expect after the procedure? °After the procedure, it is common to have: °· Pain at your incision sites. You will be given medicines to control this pain. °· Mild nausea or vomiting. °· Bloating and possible shoulder pain from the air-like gas that was used during the procedure. ° °Follow these instructions at home: °Incision care ° °· Follow instructions from your health care provider about how to take care of your incisions. Make sure you: °? Wash your hands with soap and water before you change your bandage (dressing). If soap and water are not available, use hand sanitizer. °? Change your dressing as told by your health care provider. °? Leave stitches (sutures), skin glue, or adhesive strips in place. These skin closures may need to be in place for 2 weeks or longer. If adhesive strip edges start to loosen and curl up, you may trim the loose edges. Do not remove adhesive strips completely unless your health care provider tells you to do that. °· Do not take baths, swim, or use a hot tub until your health care provider approves. Ask your health care provider if you can take showers. You may only be allowed to take sponge baths for bathing. °· Check your incision area every day for signs of infection. Check for: °? More redness, swelling, or pain. °? More fluid or blood. °? Warmth. °? Pus or a bad smell. °Activity °· Do not drive or use heavy machinery while taking prescription pain medicine. °· Do not lift anything that is heavier than 10 lb (4.5 kg) until your health care provider approves. °· Do not play contact sports until your health care provider approves. °· Do not drive for 24 hours if you were given a medicine to help you relax  (sedative). °· Rest as needed. Do not return to work or school until your health care provider approves. °General instructions °· Take over-the-counter and prescription medicines only as told by your health care provider. °· To prevent or treat constipation while you are taking prescription pain medicine, your health care provider may recommend that you: °? Drink enough fluid to keep your urine clear or pale yellow. °? Take over-the-counter or prescription medicines. °? Eat foods that are high in fiber, such as fresh fruits and vegetables, whole grains, and beans. °? Limit foods that are high in fat and processed sugars, such as fried and sweet foods. °Contact a health care provider if: °· You develop a rash. °· You have more redness, swelling, or pain around your incisions. °· You have more fluid or blood coming from your incisions. °· Your incisions feel warm to the touch. °· You have pus or a bad smell coming from your incisions. °· You have a fever. °· One or more of your incisions breaks open. °Get help right away if: °· You have trouble breathing. °· You have chest pain. °· You have increasing pain in your shoulders. °· You faint or feel dizzy when you stand. °· You have severe pain in your abdomen. °· You have nausea or vomiting that lasts for more than one day. °· You have leg pain. °This information is not intended to replace advice given to you by your health care provider. Make sure you discuss any questions you   have with your health care provider. °Document Released: 06/07/2005 Document Revised: 12/27/2015 Document Reviewed: 11/24/2015 °Elsevier Interactive Patient Education © 2018 [employer] ° °

## 2017-07-08 NOTE — Transfer of Care (Signed)
Immediate Anesthesia Transfer of Care Note  Patient: Luke [name]  Procedure(s) Performed: LAPAROSCOPIC CHOLECYSTECTOMY (N/A Abdomen)  Patient Location: PACU  Anesthesia Type:General  Level of Consciousness: awake and patient cooperative  Airway & Oxygen Therapy: Patient Spontanous Breathing and non-rebreather face mask  Post-op Assessment: Report given to RN and Post -op Vital signs reviewed and stable  Post vital signs: Reviewed and stable  Last Vitals:  Vitals:   07/08/17 1015 07/08/17 1045  BP: 140/67 (!) 145/67  Pulse:    Resp: (!) 21 (!) 23  Temp:    SpO2: 98% 99%    Last Pain:  Vitals:   07/08/17 0926  TempSrc: Oral  PainSc:       Patients Stated Pain Goal: 8 (83/33/83 2919)  Complications: No apparent anesthesia complications

## 2017-07-08 NOTE — Anesthesia Preprocedure Evaluation (Signed)
Anesthesia Evaluation  Patient identified by MRN, date of birth, ID band Patient awake    Reviewed: Allergy & Precautions, H&P , NPO status , Patient's Chart, lab work & pertinent test results  Airway Mallampati: II  TM Distance: >3 FB Neck ROM: full    Dental  (+) Teeth Intact   Pulmonary sleep apnea , COPD, Current Smoker, former smoker,  Mild COPD   breath sounds clear to auscultation       Cardiovascular Exercise Tolerance: Good hypertension, Pt. on medications (-) angina+ CAD   Rhythm:regular Rate:Normal     Neuro/Psych negative neurological ROS  negative psych ROS   GI/Hepatic Neg liver ROS, GERD  Medicated and Controlled,  Endo/Other  diabetes, Well Controlled, Type 2, Oral Hypoglycemic Agents  Renal/GU negative Renal ROS     Musculoskeletal  (+) Arthritis ,   Abdominal   Peds  Hematology negative hematology ROS (+) anemia ,   Anesthesia Other Findings   Reproductive/Obstetrics                             Anesthesia Physical Anesthesia Plan  ASA: III  Anesthesia Plan: General   Post-op Pain Management:    Induction: Intravenous, Rapid sequence and Cricoid pressure planned  PONV Risk Score and Plan:   Airway Management Planned: Oral ETT  Additional Equipment:   Intra-op Plan:   Post-operative Plan: Extubation in OR  Informed Consent: I have reviewed the patients History and Physical, chart, labs and discussed the procedure including the risks, benefits and alternatives for the proposed anesthesia with the patient or authorized representative who has indicated his/her understanding and acceptance.     Plan Discussed with:   Anesthesia Plan Comments:         Anesthesia Quick Evaluation

## 2017-07-08 NOTE — Progress Notes (Signed)
discussed with Dr Arnoldo Morale. He has kindly taken patient in his service with plans to discharge him home postoperatively.

## 2017-07-08 NOTE — Anesthesia Postprocedure Evaluation (Signed)
Anesthesia Post Note  Patient: Luke [name]  Procedure(s) Performed: LAPAROSCOPIC CHOLECYSTECTOMY (N/A Abdomen)  Patient location during evaluation: PACU Anesthesia Type: General Level of consciousness: awake and alert Pain management: satisfactory to patient Vital Signs Assessment: post-procedure vital signs reviewed and stable Respiratory status: spontaneous breathing Cardiovascular status: stable Postop Assessment: no apparent nausea or vomiting Anesthetic complications: no     Last Vitals:  Vitals:   07/08/17 1233 07/08/17 1245  BP: (!) 146/72 (!) 150/69  Pulse: 85 82  Resp: 17 16  Temp: 36.8 C   SpO2: 100% 91%    Last Pain:  Vitals:   07/08/17 1233  TempSrc:   PainSc: 0-No pain                 Dawnisha Marquina

## 2017-07-11 ENCOUNTER — Encounter (HOSPITAL_COMMUNITY): Payer: Self-pay | Admitting: General Surgery

## 2017-07-11 NOTE — Discharge Summary (Signed)
Physician Discharge Summary  Patient ID: Luke [name] MRN: [id number] DOB/AGE: [birth year] 67 y.o.  Admit date: 07/07/2017 Discharge date: 07/08/2017  Admission Diagnoses: Acute cholecystitis, cholelithiasis  Discharge Diagnoses: Same Principal Problem:   Acute cholecystitis Active Problems:   GERD (gastroesophageal reflux disease)   IDA (iron deficiency anemia)   DM type 2 (diabetes mellitus, type 2) (HCC)   HTN (hypertension)   Hyperlipemia   CAD (coronary artery disease)   Discharged Condition: good  Hospital Course: Patient is a 67 year old white male who presented to the emergency room on 07/07/17 with right upper quadrant abdominal pain.  He has a known history of cholelithiasis.  He was found to have acute cholecystitis with cholelithiasis.  He was taken to the operating room on 07/08/2017 and underwent laparoscopic cholecystectomy.  He tolerated procedure well.  He was discharged home from the PACU in good and stable condition.  Treatments: surgery: Laparoscopic cholecystectomy on 07/08/2017  Discharge Exam: Blood pressure (!) 166/68, pulse 80, temperature 98.3 F (36.8 C), temperature source Oral, resp. rate 18, height 5\' 5"  (1.651 m), weight 175 lb (79.4 kg), SpO2 93 %. General appearance: alert, cooperative and no distress Resp: clear to auscultation bilaterally Cardio: regular rate and rhythm, S1, S2 normal, no murmur, click, rub or gallop GI: Soft, dressing is dry and intact.  Disposition: 01-Home or Self Care  Discharge Instructions    Diet - low sodium heart healthy   Complete by:  As directed    Increase activity slowly   Complete by:  As directed      Allergies as of 07/08/2017      Reactions   Morphine And Related Itching      Medication List    TAKE these medications   aspirin 81 MG tablet Take 1 tablet (81 mg total) by mouth daily. HOLD FOR 1 WEEK, THEN RESUME IF NO BLEEDING PROBLEMS What changed:  additional instructions   atorvastatin 40 MG  tablet Commonly known as:  LIPITOR TAKE 1 TABLET EVERY DAY   bisoprolol-hydrochlorothiazide 2.5-6.25 MG tablet Commonly known as:  ZIAC TAKE 1 TABLET EVERY DAY   cetirizine 10 MG tablet Commonly known as:  ZYRTEC Take 10 mg by mouth daily.   ferrous sulfate 325 (65 FE) MG EC tablet Take 1 tablet (325 mg total) by mouth 2 (two) times daily after a meal. What changed:    when to take this  additional instructions   finasteride 5 MG tablet Commonly known as:  PROSCAR Take 5 mg by mouth daily.   HYDROcodone-acetaminophen 7.5-325 MG tablet Commonly known as:  NORCO Take 1 tablet by mouth every 6 (six) hours as needed for moderate pain.   JARDIANCE 10 MG Tabs tablet Generic drug:  empagliflozin Take 10 mg by mouth daily.   loperamide 2 MG capsule Commonly known as:  IMODIUM Take 1 capsule (2 mg total) by mouth daily before breakfast.   metFORMIN 1000 MG (MOD) 24 hr tablet Commonly known as:  GLUMETZA Take 1,000 mg by mouth 2 (two) times daily.   pantoprazole 40 MG tablet Commonly known as:  PROTONIX TAKE 1 TABLET EVERY DAY   ramipril 10 MG capsule Commonly known as:  ALTACE TAKE 1 CAPSULE EVERY DAY   SOLIQUA 100-33 UNT-MCG/ML Sopn Generic drug:  Insulin Glargine-Lixisenatide Inject 20 Units into the skin daily.   tamsulosin 0.4 MG Caps capsule Commonly known as:  FLOMAX Take 0.4 mg by mouth.      Follow-up Information    Aviva Signs, MD. Schedule an  appointment as soon as possible for a visit on 07/19/2017.   Specialty:  General Surgery Contact information: [address] [place] [address] [phone]           Signed: Aviva Signs 07/11/2017, 10:27 AM

## 2017-07-13 DIAGNOSIS — E1165 Type 2 diabetes mellitus with hyperglycemia: Secondary | ICD-10-CM | POA: Diagnosis not present

## 2017-07-13 DIAGNOSIS — Z713 Dietary counseling and surveillance: Secondary | ICD-10-CM | POA: Diagnosis not present

## 2017-07-13 DIAGNOSIS — D509 Iron deficiency anemia, unspecified: Secondary | ICD-10-CM | POA: Diagnosis not present

## 2017-07-13 DIAGNOSIS — I10 Essential (primary) hypertension: Secondary | ICD-10-CM | POA: Diagnosis not present

## 2017-07-13 DIAGNOSIS — E782 Mixed hyperlipidemia: Secondary | ICD-10-CM | POA: Diagnosis not present

## 2017-07-18 ENCOUNTER — Encounter (HOSPITAL_COMMUNITY): Payer: Self-pay | Admitting: Internal Medicine

## 2017-07-19 ENCOUNTER — Ambulatory Visit (INDEPENDENT_AMBULATORY_CARE_PROVIDER_SITE_OTHER): Payer: Self-pay | Admitting: General Surgery

## 2017-07-19 ENCOUNTER — Encounter: Payer: Self-pay | Admitting: General Surgery

## 2017-07-19 VITALS — BP 187/80 | HR 75 | Temp 98.2°F | Ht 65.0 in | Wt 175.0 lb

## 2017-07-19 DIAGNOSIS — Z09 Encounter for follow-up examination after completed treatment for conditions other than malignant neoplasm: Secondary | ICD-10-CM

## 2017-07-19 NOTE — Progress Notes (Signed)
Subjective:     Luke [name]  Status post laparoscopic cholecystectomy.  Doing very well.  Has no complaints of pain.  No nausea or vomiting.  No fevers.  Tolerating diet well. Objective:    BP (!) 187/80   Pulse 75   Temp 98.2 F (36.8 C)   Ht 5\' 5"  (1.651 m)   Wt 175 lb (79.4 kg)   BMI 29.12 kg/m   General:  alert, cooperative and no distress  Abdomen soft, incisions healing well.  Staples removed. Final pathology consistent with diagnosis.     Assessment:    Doing well postoperatively.    Plan:   May resume normal activity.  Follow-up here as needed.

## 2017-07-25 DIAGNOSIS — B351 Tinea unguium: Secondary | ICD-10-CM | POA: Diagnosis not present

## 2017-07-25 DIAGNOSIS — M79675 Pain in left toe(s): Secondary | ICD-10-CM | POA: Diagnosis not present

## 2017-07-25 DIAGNOSIS — M79674 Pain in right toe(s): Secondary | ICD-10-CM | POA: Diagnosis not present

## 2017-07-25 DIAGNOSIS — E1151 Type 2 diabetes mellitus with diabetic peripheral angiopathy without gangrene: Secondary | ICD-10-CM | POA: Diagnosis not present

## 2017-07-25 DIAGNOSIS — L851 Acquired keratosis [keratoderma] palmaris et plantaris: Secondary | ICD-10-CM | POA: Diagnosis not present

## 2017-07-27 DIAGNOSIS — N401 Enlarged prostate with lower urinary tract symptoms: Secondary | ICD-10-CM | POA: Diagnosis not present

## 2017-08-03 DIAGNOSIS — N401 Enlarged prostate with lower urinary tract symptoms: Secondary | ICD-10-CM | POA: Diagnosis not present

## 2017-08-03 DIAGNOSIS — R3912 Poor urinary stream: Secondary | ICD-10-CM | POA: Diagnosis not present

## 2017-08-05 DIAGNOSIS — H90A32 Mixed conductive and sensorineural hearing loss, unilateral, left ear with restricted hearing on the contralateral side: Secondary | ICD-10-CM | POA: Diagnosis not present

## 2017-08-05 DIAGNOSIS — H90A21 Sensorineural hearing loss, unilateral, right ear, with restricted hearing on the contralateral side: Secondary | ICD-10-CM | POA: Diagnosis not present

## 2017-08-24 DIAGNOSIS — M720 Palmar fascial fibromatosis [Dupuytren]: Secondary | ICD-10-CM | POA: Diagnosis not present

## 2017-08-24 DIAGNOSIS — M79642 Pain in left hand: Secondary | ICD-10-CM | POA: Diagnosis not present

## 2017-08-26 ENCOUNTER — Ambulatory Visit (INDEPENDENT_AMBULATORY_CARE_PROVIDER_SITE_OTHER): Payer: Medicare Other | Admitting: Internal Medicine

## 2017-09-06 DIAGNOSIS — E782 Mixed hyperlipidemia: Secondary | ICD-10-CM | POA: Diagnosis not present

## 2017-09-06 DIAGNOSIS — I10 Essential (primary) hypertension: Secondary | ICD-10-CM | POA: Diagnosis not present

## 2017-09-06 DIAGNOSIS — E1165 Type 2 diabetes mellitus with hyperglycemia: Secondary | ICD-10-CM | POA: Diagnosis not present

## 2017-09-09 DIAGNOSIS — Z96653 Presence of artificial knee joint, bilateral: Secondary | ICD-10-CM | POA: Diagnosis not present

## 2017-09-09 DIAGNOSIS — Z720 Tobacco use: Secondary | ICD-10-CM | POA: Diagnosis not present

## 2017-09-09 DIAGNOSIS — Z6828 Body mass index (BMI) 28.0-28.9, adult: Secondary | ICD-10-CM | POA: Diagnosis not present

## 2017-09-09 DIAGNOSIS — N400 Enlarged prostate without lower urinary tract symptoms: Secondary | ICD-10-CM | POA: Diagnosis not present

## 2017-09-09 DIAGNOSIS — D509 Iron deficiency anemia, unspecified: Secondary | ICD-10-CM | POA: Diagnosis not present

## 2017-09-09 DIAGNOSIS — E782 Mixed hyperlipidemia: Secondary | ICD-10-CM | POA: Diagnosis not present

## 2017-09-09 DIAGNOSIS — E1165 Type 2 diabetes mellitus with hyperglycemia: Secondary | ICD-10-CM | POA: Diagnosis not present

## 2017-09-09 DIAGNOSIS — I10 Essential (primary) hypertension: Secondary | ICD-10-CM | POA: Diagnosis not present

## 2017-09-12 DIAGNOSIS — M9903 Segmental and somatic dysfunction of lumbar region: Secondary | ICD-10-CM | POA: Diagnosis not present

## 2017-09-12 DIAGNOSIS — M9902 Segmental and somatic dysfunction of thoracic region: Secondary | ICD-10-CM | POA: Diagnosis not present

## 2017-09-12 DIAGNOSIS — M5136 Other intervertebral disc degeneration, lumbar region: Secondary | ICD-10-CM | POA: Diagnosis not present

## 2017-09-12 DIAGNOSIS — M9904 Segmental and somatic dysfunction of sacral region: Secondary | ICD-10-CM | POA: Diagnosis not present

## 2017-09-12 DIAGNOSIS — M9901 Segmental and somatic dysfunction of cervical region: Secondary | ICD-10-CM | POA: Diagnosis not present

## 2017-09-13 DIAGNOSIS — M9902 Segmental and somatic dysfunction of thoracic region: Secondary | ICD-10-CM | POA: Diagnosis not present

## 2017-09-13 DIAGNOSIS — M5136 Other intervertebral disc degeneration, lumbar region: Secondary | ICD-10-CM | POA: Diagnosis not present

## 2017-09-13 DIAGNOSIS — M9901 Segmental and somatic dysfunction of cervical region: Secondary | ICD-10-CM | POA: Diagnosis not present

## 2017-09-13 DIAGNOSIS — M9904 Segmental and somatic dysfunction of sacral region: Secondary | ICD-10-CM | POA: Diagnosis not present

## 2017-09-13 DIAGNOSIS — M9903 Segmental and somatic dysfunction of lumbar region: Secondary | ICD-10-CM | POA: Diagnosis not present

## 2017-09-14 DIAGNOSIS — M9903 Segmental and somatic dysfunction of lumbar region: Secondary | ICD-10-CM | POA: Diagnosis not present

## 2017-09-14 DIAGNOSIS — M9901 Segmental and somatic dysfunction of cervical region: Secondary | ICD-10-CM | POA: Diagnosis not present

## 2017-09-14 DIAGNOSIS — M5136 Other intervertebral disc degeneration, lumbar region: Secondary | ICD-10-CM | POA: Diagnosis not present

## 2017-09-14 DIAGNOSIS — M9904 Segmental and somatic dysfunction of sacral region: Secondary | ICD-10-CM | POA: Diagnosis not present

## 2017-09-14 DIAGNOSIS — M9902 Segmental and somatic dysfunction of thoracic region: Secondary | ICD-10-CM | POA: Diagnosis not present

## 2017-09-16 DIAGNOSIS — M9901 Segmental and somatic dysfunction of cervical region: Secondary | ICD-10-CM | POA: Diagnosis not present

## 2017-09-16 DIAGNOSIS — M9902 Segmental and somatic dysfunction of thoracic region: Secondary | ICD-10-CM | POA: Diagnosis not present

## 2017-09-16 DIAGNOSIS — M9903 Segmental and somatic dysfunction of lumbar region: Secondary | ICD-10-CM | POA: Diagnosis not present

## 2017-09-16 DIAGNOSIS — M9904 Segmental and somatic dysfunction of sacral region: Secondary | ICD-10-CM | POA: Diagnosis not present

## 2017-09-16 DIAGNOSIS — M5136 Other intervertebral disc degeneration, lumbar region: Secondary | ICD-10-CM | POA: Diagnosis not present

## 2017-09-19 DIAGNOSIS — M9903 Segmental and somatic dysfunction of lumbar region: Secondary | ICD-10-CM | POA: Diagnosis not present

## 2017-09-19 DIAGNOSIS — M9904 Segmental and somatic dysfunction of sacral region: Secondary | ICD-10-CM | POA: Diagnosis not present

## 2017-09-19 DIAGNOSIS — M9901 Segmental and somatic dysfunction of cervical region: Secondary | ICD-10-CM | POA: Diagnosis not present

## 2017-09-19 DIAGNOSIS — M5136 Other intervertebral disc degeneration, lumbar region: Secondary | ICD-10-CM | POA: Diagnosis not present

## 2017-09-20 DIAGNOSIS — M9903 Segmental and somatic dysfunction of lumbar region: Secondary | ICD-10-CM | POA: Diagnosis not present

## 2017-09-20 DIAGNOSIS — M9902 Segmental and somatic dysfunction of thoracic region: Secondary | ICD-10-CM | POA: Diagnosis not present

## 2017-09-20 DIAGNOSIS — M9901 Segmental and somatic dysfunction of cervical region: Secondary | ICD-10-CM | POA: Diagnosis not present

## 2017-09-20 DIAGNOSIS — M5136 Other intervertebral disc degeneration, lumbar region: Secondary | ICD-10-CM | POA: Diagnosis not present

## 2017-09-20 DIAGNOSIS — M9904 Segmental and somatic dysfunction of sacral region: Secondary | ICD-10-CM | POA: Diagnosis not present

## 2017-10-03 DIAGNOSIS — B351 Tinea unguium: Secondary | ICD-10-CM | POA: Diagnosis not present

## 2017-10-03 DIAGNOSIS — L851 Acquired keratosis [keratoderma] palmaris et plantaris: Secondary | ICD-10-CM | POA: Diagnosis not present

## 2017-10-03 DIAGNOSIS — M79675 Pain in left toe(s): Secondary | ICD-10-CM | POA: Diagnosis not present

## 2017-10-03 DIAGNOSIS — M79674 Pain in right toe(s): Secondary | ICD-10-CM | POA: Diagnosis not present

## 2017-10-03 DIAGNOSIS — E1151 Type 2 diabetes mellitus with diabetic peripheral angiopathy without gangrene: Secondary | ICD-10-CM | POA: Diagnosis not present

## 2017-10-04 DIAGNOSIS — H2513 Age-related nuclear cataract, bilateral: Secondary | ICD-10-CM | POA: Diagnosis not present

## 2017-10-04 DIAGNOSIS — E119 Type 2 diabetes mellitus without complications: Secondary | ICD-10-CM | POA: Diagnosis not present

## 2017-10-05 DIAGNOSIS — Z1211 Encounter for screening for malignant neoplasm of colon: Secondary | ICD-10-CM | POA: Diagnosis not present

## 2017-10-05 DIAGNOSIS — D509 Iron deficiency anemia, unspecified: Secondary | ICD-10-CM | POA: Diagnosis not present

## 2017-10-05 DIAGNOSIS — I2510 Atherosclerotic heart disease of native coronary artery without angina pectoris: Secondary | ICD-10-CM | POA: Diagnosis not present

## 2017-10-05 DIAGNOSIS — I10 Essential (primary) hypertension: Secondary | ICD-10-CM | POA: Diagnosis not present

## 2017-10-05 DIAGNOSIS — Z6829 Body mass index (BMI) 29.0-29.9, adult: Secondary | ICD-10-CM | POA: Diagnosis not present

## 2017-10-05 DIAGNOSIS — E782 Mixed hyperlipidemia: Secondary | ICD-10-CM | POA: Diagnosis not present

## 2017-10-05 DIAGNOSIS — N400 Enlarged prostate without lower urinary tract symptoms: Secondary | ICD-10-CM | POA: Diagnosis not present

## 2017-10-05 DIAGNOSIS — D72829 Elevated white blood cell count, unspecified: Secondary | ICD-10-CM | POA: Diagnosis not present

## 2017-10-05 DIAGNOSIS — E1165 Type 2 diabetes mellitus with hyperglycemia: Secondary | ICD-10-CM | POA: Diagnosis not present

## 2017-10-05 DIAGNOSIS — Z96653 Presence of artificial knee joint, bilateral: Secondary | ICD-10-CM | POA: Diagnosis not present

## 2017-10-05 DIAGNOSIS — Z713 Dietary counseling and surveillance: Secondary | ICD-10-CM | POA: Diagnosis not present

## 2017-10-05 DIAGNOSIS — Z6828 Body mass index (BMI) 28.0-28.9, adult: Secondary | ICD-10-CM | POA: Diagnosis not present

## 2017-10-21 DIAGNOSIS — I2510 Atherosclerotic heart disease of native coronary artery without angina pectoris: Secondary | ICD-10-CM | POA: Diagnosis not present

## 2017-10-21 DIAGNOSIS — I10 Essential (primary) hypertension: Secondary | ICD-10-CM | POA: Diagnosis not present

## 2017-10-21 DIAGNOSIS — E1165 Type 2 diabetes mellitus with hyperglycemia: Secondary | ICD-10-CM | POA: Diagnosis not present

## 2017-10-21 DIAGNOSIS — R202 Paresthesia of skin: Secondary | ICD-10-CM | POA: Diagnosis not present

## 2017-10-21 DIAGNOSIS — D509 Iron deficiency anemia, unspecified: Secondary | ICD-10-CM | POA: Diagnosis not present

## 2017-10-21 DIAGNOSIS — Z713 Dietary counseling and surveillance: Secondary | ICD-10-CM | POA: Diagnosis not present

## 2017-10-21 DIAGNOSIS — E782 Mixed hyperlipidemia: Secondary | ICD-10-CM | POA: Diagnosis not present

## 2017-10-21 DIAGNOSIS — Z96653 Presence of artificial knee joint, bilateral: Secondary | ICD-10-CM | POA: Diagnosis not present

## 2017-10-21 DIAGNOSIS — N400 Enlarged prostate without lower urinary tract symptoms: Secondary | ICD-10-CM | POA: Diagnosis not present

## 2017-10-21 DIAGNOSIS — D72829 Elevated white blood cell count, unspecified: Secondary | ICD-10-CM | POA: Diagnosis not present

## 2017-10-21 DIAGNOSIS — Z6829 Body mass index (BMI) 29.0-29.9, adult: Secondary | ICD-10-CM | POA: Diagnosis not present

## 2017-10-21 DIAGNOSIS — E119 Type 2 diabetes mellitus without complications: Secondary | ICD-10-CM | POA: Diagnosis not present

## 2017-11-02 DIAGNOSIS — Z6828 Body mass index (BMI) 28.0-28.9, adult: Secondary | ICD-10-CM | POA: Diagnosis not present

## 2017-11-02 DIAGNOSIS — N400 Enlarged prostate without lower urinary tract symptoms: Secondary | ICD-10-CM | POA: Diagnosis not present

## 2017-11-02 DIAGNOSIS — Z713 Dietary counseling and surveillance: Secondary | ICD-10-CM | POA: Diagnosis not present

## 2017-11-02 DIAGNOSIS — Z1211 Encounter for screening for malignant neoplasm of colon: Secondary | ICD-10-CM | POA: Diagnosis not present

## 2017-11-02 DIAGNOSIS — E782 Mixed hyperlipidemia: Secondary | ICD-10-CM | POA: Diagnosis not present

## 2017-11-02 DIAGNOSIS — E1165 Type 2 diabetes mellitus with hyperglycemia: Secondary | ICD-10-CM | POA: Diagnosis not present

## 2017-11-02 DIAGNOSIS — I2510 Atherosclerotic heart disease of native coronary artery without angina pectoris: Secondary | ICD-10-CM | POA: Diagnosis not present

## 2017-11-02 DIAGNOSIS — Z6829 Body mass index (BMI) 29.0-29.9, adult: Secondary | ICD-10-CM | POA: Diagnosis not present

## 2017-11-02 DIAGNOSIS — I10 Essential (primary) hypertension: Secondary | ICD-10-CM | POA: Diagnosis not present

## 2017-11-02 DIAGNOSIS — Z96653 Presence of artificial knee joint, bilateral: Secondary | ICD-10-CM | POA: Diagnosis not present

## 2017-11-02 DIAGNOSIS — D72829 Elevated white blood cell count, unspecified: Secondary | ICD-10-CM | POA: Diagnosis not present

## 2017-11-02 DIAGNOSIS — D509 Iron deficiency anemia, unspecified: Secondary | ICD-10-CM | POA: Diagnosis not present

## 2017-11-18 DIAGNOSIS — Z96653 Presence of artificial knee joint, bilateral: Secondary | ICD-10-CM | POA: Diagnosis not present

## 2017-11-18 DIAGNOSIS — Z471 Aftercare following joint replacement surgery: Secondary | ICD-10-CM | POA: Diagnosis not present

## 2017-11-18 DIAGNOSIS — M79605 Pain in left leg: Secondary | ICD-10-CM | POA: Diagnosis not present

## 2017-11-18 DIAGNOSIS — M170 Bilateral primary osteoarthritis of knee: Secondary | ICD-10-CM | POA: Diagnosis not present

## 2017-11-25 DIAGNOSIS — M545 Low back pain: Secondary | ICD-10-CM | POA: Diagnosis not present

## 2017-12-01 ENCOUNTER — Other Ambulatory Visit: Payer: Self-pay | Admitting: Adult Health

## 2017-12-01 ENCOUNTER — Encounter: Payer: Self-pay | Admitting: Adult Health

## 2017-12-01 ENCOUNTER — Ambulatory Visit (INDEPENDENT_AMBULATORY_CARE_PROVIDER_SITE_OTHER): Payer: Medicare Other | Admitting: Adult Health

## 2017-12-01 VITALS — BP 160/72 | HR 65 | Ht 65.0 in | Wt 177.0 lb

## 2017-12-01 DIAGNOSIS — R0989 Other specified symptoms and signs involving the circulatory and respiratory systems: Secondary | ICD-10-CM

## 2017-12-01 DIAGNOSIS — I2510 Atherosclerotic heart disease of native coronary artery without angina pectoris: Secondary | ICD-10-CM | POA: Diagnosis not present

## 2017-12-01 DIAGNOSIS — I10 Essential (primary) hypertension: Secondary | ICD-10-CM | POA: Diagnosis not present

## 2017-12-01 MED ORDER — AMLODIPINE BESYLATE 2.5 MG PO TABS: 2.5000 mg | ORAL_TABLET | Freq: Every day | ORAL | 3 refills | Status: DC

## 2017-12-01 NOTE — Patient Instructions (Signed)
Medication Instructions:  START AMLODIPINE 2.5MG  DAILY  If you need a refill on your cardiac medications before your next appointment, please call your pharmacy.  Labwork: HAVE DR HALL's OFFICE S FAX TO DR Purcell Nails @ [phone]   Testing/Procedures: Your physician has requested that you have an ankle brachial index (ABI). During this test an ultrasound and blood pressure cuff are used to evaluate the arteries that supply the arms and legs with blood. Allow thirty minutes for this exam. There are no restrictions or special instructions.  Follow-Up: Your physician wants you to follow-up in: [place].    Thank you for choosing [facility] at [facility]!!

## 2017-12-01 NOTE — Progress Notes (Signed)
Cardiology Office Note   Date:  12/01/2017   ID:  Luke [name], DOB Mar 30, 1951, MRN [id number]  PCP:  Celene Squibb, MD  Cardiologist: Dr. Claiborne Billings   Chief Complaint  Patient presents with  . Follow-up     History of Present Illness: Luke [name] is a 67 y.o. male who presents for ongoing assessment and management of coronary artery disease, hypertension, iron deficiency anemia, and diabetes.  Most recent cardiac catheterization was 50% mid LAD stenosis, 50% ostial left circumflex stenosis, he had mild luminal irregularity of his right coronary artery.  He had mild luminal irregularity coronary artery he has been on aggressive medical therapy since that time.  On last office visit with Dr. Claiborne Billings on 03/29/2017 blood pressure metoprolol HCTZ.  He was to monitor his blood pressure at home.  In January 2019 the patient had a laparoscopic cholecystectomy the setting of cholelithiasis and cholecystitis.  The patient tolerated procedure well and is recovered without any further complaints.  He is now complaining of some left leg pain and numbness.  He has been seen by Dr. Ricki Rodriguez with an MRI to evaluate for disc disease.  He states he has pain and numbness in his left leg worsening with walking, especially in the calf.     Past Medical History:  Diagnosis Date  . CAD (coronary artery disease) 10/02/2010   nuclear study show normal perfusion on medical therapy without scar or ischemia. EF 64%  . Diabetes mellitus ([place])    type 2   . GERD (gastroesophageal reflux disease)   . Heart murmur   . Hematochezia 11/19/2009  . Hyperlipidemia   . Hypertension   . Iron deficiency anemia 11/19/2009  . Osteoarthritis    left knee  . 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 Sleep apnea    study 20 yrs ago. no cpap used now    Past Surgical History:  Procedure Laterality Date  . CARDIAC CATHETERIZATION  08/1999   which revealed a mild coronary artery disease with 60 and 70 and 80% stenosis in a small first  diagonal vessel at the LAD, 50% mid LAD stenosis, 50% ostial left circumflrx stenosis, and 40% ostial and proximal intermediate stenosis. he had 10 to 20% irregularities of his mid right coronary artery.  . CHOLECYSTECTOMY N/A 07/08/2017   Procedure: LAPAROSCOPIC CHOLECYSTECTOMY;  Surgeon: Aviva Signs, MD;  Location: [facility];  Service: General;  Laterality: N/A;  . CIRCUMCISION  07/17/2012   Procedure: CIRCUMCISION ADULT;  Surgeon: Dutch Gray, MD;  Location: [facility];  Service: Urology;  Laterality: N/A;  . COLONOSCOPY  02/14/06   M JENKINS  . COLONOSCOPY N/A 10/28/2014   Procedure: COLONOSCOPY;  Surgeon: Rogene Houston, MD;  Location: [facility] SUITE;  Service: Endoscopy;  Laterality: N/A;  730  . ENTEROSCOPY N/A 10/04/2014   Procedure: ENTEROSCOPY;  Surgeon: Carol Ada, MD;  Location: [facility] ENDOSCOPY;  Service: Endoscopy;  Laterality: N/A;  . ERCP  12/25/1996   ROURK  . GIVENS CAPSULE STUDY N/A 10/04/2014   Procedure: GIVENS CAPSULE STUDY;  Surgeon: Carol Ada, MD;  Location: [place];  Service: Endoscopy;  Laterality: N/A;  . JOINT REPLACEMENT  07-12-12   2008/2007 -knee replacements  . SMALL BOWEL GIVENS  11/25/2008  . UPPER GASTROINTESTINAL ENDOSCOPY  11/08/2008   EGD TCS     Current Outpatient Medications  Medication Sig Dispense Refill  . aspirin 81 MG tablet Take 1 tablet (81 mg total) by mouth daily. HOLD FOR 1 WEEK, THEN RESUME IF NO BLEEDING PROBLEMS (  Patient taking differently: Take 81 mg by mouth daily. ) 30 tablet   . atorvastatin (LIPITOR) 40 MG tablet TAKE 1 TABLET EVERY DAY 90 tablet 3  . bisoprolol-hydrochlorothiazide (ZIAC) 2.5-6.25 MG tablet TAKE 1 TABLET EVERY DAY 90 tablet 3  . cetirizine (ZYRTEC) 10 MG tablet Take 10 mg by mouth daily.    . empagliflozin (JARDIANCE) 10 MG TABS tablet Take 10 mg by mouth daily.    . ferrous sulfate 325 (65 FE) MG EC tablet Take 1 tablet (325 mg total) by mouth 2 (two) times daily after a meal. (Patient taking differently: Take 325 mg  by mouth daily with breakfast. Once a day)  3  . finasteride (PROSCAR) 5 MG tablet Take 5 mg by mouth daily.    . Insulin Glargine-Lixisenatide (SOLIQUA) 100-33 UNT-MCG/ML SOPN Inject 20 Units into the skin daily.     . metFORMIN (GLUMETZA) 1000 MG (MOD) 24 hr tablet Take 1,000 mg by mouth 2 (two) times daily.     . pantoprazole (PROTONIX) 40 MG tablet TAKE 1 TABLET EVERY DAY 90 tablet 3  . ramipril (ALTACE) 10 MG capsule TAKE 1 CAPSULE EVERY DAY 90 capsule 3  . tamsulosin (FLOMAX) 0.4 MG CAPS capsule Take 0.4 mg by mouth.    Marland Kitchen amLODipine (NORVASC) 2.5 MG tablet Take 1 tablet (2.5 mg total) by mouth daily. 180 tablet 3   No current facility-administered medications for this visit.     Allergies:   Morphine and related    Social History:  The patient  reports that he quit smoking about 3 years ago. His smoking use included cigarettes. He has a 50.00 pack-year smoking history. He has never used smokeless tobacco. He reports that he does not drink alcohol or use drugs.   Family History:  The patient's family history includes Healthy in his brother, brother, brother, daughter, daughter, sister, and sister.    ROS: All other systems are reviewed and negative. Unless otherwise mentioned in H&P    PHYSICAL EXAM: VS:  BP (!) 160/72   Pulse 65   Ht 5\' 5"  (1.651 m)   Wt 177 lb (80.3 kg)   BMI 29.45 kg/m  , BMI Body mass index is 29.45 kg/m. GEN: Well nourished, well developed, in no acute distress  HEENT: normal  Neck: no JVD, carotid bruits, or masses Cardiac: RRR; no murmurs, rubs, or gallops,no edema Diminished pulses in the left PT and DP.  Respiratory:  clear to auscultation bilaterally, normal work of breathing GI: soft, nontender, nondistended, + BS MS: no deformity or atrophy  Skin: warm and dry, no rash Neuro:  Strength and sensation are intact Psych: euthymic mood, full affect   EKG: Normal sinus rhythm, heart rate of 65 bpm.  Recent Labs: 07/07/2017: ALT 17; BUN 12;  Creatinine, Ser 0.89; Hemoglobin 11.1; Platelets 322; Potassium 3.5; Sodium 135 07/08/2017: Magnesium 1.7    Lipid Panel    Component Value Date/Time   CHOL 149 03/05/2016 0830   TRIG 133 03/05/2016 0830   HDL 36 (L) 03/05/2016 0830   LDLCALC 86 03/05/2016 0830      Wt Readings from Last 3 Encounters:  12/01/17 177 lb (80.3 kg)  07/19/17 175 lb (79.4 kg)  07/07/17 175 lb (79.4 kg)    ASSESSMENT AND PLAN:  1.  Coronary artery disease: Most recent cardiac catheterization as above.  He is without any complaints of chest pain dizziness dyspnea on exertion.  He is medically compliant.  I would like to get follow-up labs  concerning cholesterol status for continued evaluation of primary prevention.  He is going to be seen by his primary care physician, Dr. Wende Neighbors, tomorrow 12/02/2017, and labs will be drawn at that time.  I have asked him to please send [place] a copy. Can consider repeat ischemic testing if he becomes symptomatic   2.  Left leg pain: Pulses are diminished in the left leg posterior tibial.  He has complaints suggestive of intermittent claudication.  I am going to order bilateral ABIs with Doppler vascular study to evaluate for PAD with known history of hypertension, hypercholesterolemia, and diabetes.  3.  Hypertension: Not controlled currently.  He was unable to take higher doses of bisoprolol HCTZ without becoming extremely fatigued.  I will add low-dose amlodipine 2.5 mg per his medication regimen and continue him on bisoprolol HCTZ and ramipril.  I have rechecked his blood pressure in the exam room today and it does correlate with initial blood pressure taken when he first arrived. He will take BP at home BID and record.   4.  Diabetes: Followed by PCP.  He is having lithotripsy this afternoon   Current medicines are reviewed at length with the patient today.    Labs/ tests ordered today include: ABI bilaterally. Requesting labs from PCP. Phill Myron. [place] Pugh, ANP,  AACC   12/01/2017 8:46 AM    [facility] [address], [place], [place] [address] Phone: [phone]; Fax: [phone]

## 2017-12-02 ENCOUNTER — Encounter: Payer: Self-pay | Admitting: Adult Health

## 2017-12-02 DIAGNOSIS — I2510 Atherosclerotic heart disease of native coronary artery without angina pectoris: Secondary | ICD-10-CM | POA: Diagnosis not present

## 2017-12-02 DIAGNOSIS — I10 Essential (primary) hypertension: Secondary | ICD-10-CM | POA: Diagnosis not present

## 2017-12-02 DIAGNOSIS — E782 Mixed hyperlipidemia: Secondary | ICD-10-CM | POA: Diagnosis not present

## 2017-12-02 DIAGNOSIS — Z96653 Presence of artificial knee joint, bilateral: Secondary | ICD-10-CM | POA: Diagnosis not present

## 2017-12-02 DIAGNOSIS — Z6829 Body mass index (BMI) 29.0-29.9, adult: Secondary | ICD-10-CM | POA: Diagnosis not present

## 2017-12-02 DIAGNOSIS — Z720 Tobacco use: Secondary | ICD-10-CM | POA: Diagnosis not present

## 2017-12-02 DIAGNOSIS — D509 Iron deficiency anemia, unspecified: Secondary | ICD-10-CM | POA: Diagnosis not present

## 2017-12-02 DIAGNOSIS — D72829 Elevated white blood cell count, unspecified: Secondary | ICD-10-CM | POA: Diagnosis not present

## 2017-12-02 DIAGNOSIS — E1165 Type 2 diabetes mellitus with hyperglycemia: Secondary | ICD-10-CM | POA: Diagnosis not present

## 2017-12-02 DIAGNOSIS — Z6828 Body mass index (BMI) 28.0-28.9, adult: Secondary | ICD-10-CM | POA: Diagnosis not present

## 2017-12-02 DIAGNOSIS — Z1211 Encounter for screening for malignant neoplasm of colon: Secondary | ICD-10-CM | POA: Diagnosis not present

## 2017-12-02 DIAGNOSIS — N400 Enlarged prostate without lower urinary tract symptoms: Secondary | ICD-10-CM | POA: Diagnosis not present

## 2017-12-09 DIAGNOSIS — Z6829 Body mass index (BMI) 29.0-29.9, adult: Secondary | ICD-10-CM | POA: Diagnosis not present

## 2017-12-09 DIAGNOSIS — R05 Cough: Secondary | ICD-10-CM | POA: Diagnosis not present

## 2017-12-09 DIAGNOSIS — E1169 Type 2 diabetes mellitus with other specified complication: Secondary | ICD-10-CM | POA: Diagnosis not present

## 2017-12-09 DIAGNOSIS — R0981 Nasal congestion: Secondary | ICD-10-CM | POA: Diagnosis not present

## 2017-12-09 DIAGNOSIS — E782 Mixed hyperlipidemia: Secondary | ICD-10-CM | POA: Diagnosis not present

## 2017-12-09 DIAGNOSIS — Z96653 Presence of artificial knee joint, bilateral: Secondary | ICD-10-CM | POA: Diagnosis not present

## 2017-12-09 DIAGNOSIS — Z720 Tobacco use: Secondary | ICD-10-CM | POA: Diagnosis not present

## 2017-12-09 DIAGNOSIS — I10 Essential (primary) hypertension: Secondary | ICD-10-CM | POA: Diagnosis not present

## 2017-12-09 DIAGNOSIS — D509 Iron deficiency anemia, unspecified: Secondary | ICD-10-CM | POA: Diagnosis not present

## 2017-12-09 DIAGNOSIS — N400 Enlarged prostate without lower urinary tract symptoms: Secondary | ICD-10-CM | POA: Diagnosis not present

## 2017-12-12 ENCOUNTER — Ambulatory Visit (HOSPITAL_COMMUNITY)
Admission: RE | Admit: 2017-12-12 | Discharge: 2017-12-12 | Disposition: A | Discharge status: Home / Self Care | Payer: Medicare Other | Source: Ambulatory Visit | Attending: Cardiology | Admitting: Cardiology

## 2017-12-12 DIAGNOSIS — R9389 Abnormal findings on diagnostic imaging of other specified body structures: Secondary | ICD-10-CM | POA: Insufficient documentation

## 2017-12-12 DIAGNOSIS — E785 Hyperlipidemia, unspecified: Secondary | ICD-10-CM | POA: Insufficient documentation

## 2017-12-12 DIAGNOSIS — I2510 Atherosclerotic heart disease of native coronary artery without angina pectoris: Secondary | ICD-10-CM | POA: Insufficient documentation

## 2017-12-12 DIAGNOSIS — I10 Essential (primary) hypertension: Secondary | ICD-10-CM | POA: Insufficient documentation

## 2017-12-12 DIAGNOSIS — R0989 Other specified symptoms and signs involving the circulatory and respiratory systems: Secondary | ICD-10-CM | POA: Insufficient documentation

## 2017-12-12 DIAGNOSIS — F17200 Nicotine dependence, unspecified, uncomplicated: Secondary | ICD-10-CM | POA: Insufficient documentation

## 2017-12-12 DIAGNOSIS — I70203 Unspecified atherosclerosis of native arteries of extremities, bilateral legs: Secondary | ICD-10-CM | POA: Diagnosis not present

## 2017-12-12 DIAGNOSIS — E1151 Type 2 diabetes mellitus with diabetic peripheral angiopathy without gangrene: Secondary | ICD-10-CM | POA: Insufficient documentation

## 2017-12-13 ENCOUNTER — Encounter (HOSPITAL_COMMUNITY)
Admission: RE | Admit: 2017-12-13 | Discharge: 2017-12-13 | Disposition: A | Discharge status: Home / Self Care | Payer: Medicare Other | Source: Ambulatory Visit | Attending: Internal Medicine | Admitting: Internal Medicine

## 2017-12-13 ENCOUNTER — Encounter (HOSPITAL_COMMUNITY): Payer: Self-pay

## 2017-12-13 DIAGNOSIS — D509 Iron deficiency anemia, unspecified: Secondary | ICD-10-CM | POA: Insufficient documentation

## 2017-12-13 HISTORY — DX: Peripheral vascular disease, unspecified: I73.9

## 2017-12-13 MED ORDER — SODIUM CHLORIDE 0.9 % IV SOLN
750.0000 mg | Freq: Once | INTRAVENOUS | Status: AC
  Administered 2017-12-13: 750 mg via INTRAVENOUS
  Filled 2017-12-13: qty 15

## 2017-12-13 MED ORDER — SODIUM CHLORIDE 0.9 % IV SOLN: Freq: Once | INTRAVENOUS | Status: DC

## 2017-12-13 NOTE — Discharge Instructions (Signed)
Ferric carboxymaltose injection What is this medicine? FERRIC CARBOXYMALTOSE (ferr-ik car-box-ee-mol-toes) is an iron complex. Iron is used to make healthy red blood cells, which carry oxygen and nutrients throughout the body. This medicine is used to treat anemia in people with chronic kidney disease or people who cannot take iron by mouth. This medicine may be used for other purposes; ask your health care provider or pharmacist if you have questions. COMMON BRAND NAME(S): Injectafer What should I tell my health care provider before I take this medicine? They need to know if you have any of these conditions: -anemia not caused by low iron levels -high levels of iron in the blood -liver disease -an unusual or allergic reaction to iron, other medicines, foods, dyes, or preservatives -pregnant or trying to get pregnant -breast-feeding How should I use this medicine? This medicine is for infusion into a vein. It is given by a health care professional in a hospital or clinic setting. Talk to your pediatrician regarding the use of this medicine in children. Special care may be needed. Overdosage: If you think you have taken too much of this medicine contact a poison control center or emergency room at once. NOTE: This medicine is only for you. Do not share this medicine with others. What if I miss a dose? It is important not to miss your dose. Call your doctor or health care professional if you are unable to keep an appointment. What may interact with this medicine? Do not take this medicine with any of the following medications: -deferoxamine -dimercaprol -other iron products This medicine may also interact with the following medications: -chloramphenicol -deferasirox This list may not describe all possible interactions. Give your health care provider a list of all the medicines, herbs, non-prescription drugs, or dietary supplements you use. Also tell them if you smoke, drink alcohol, or use  illegal drugs. Some items may interact with your medicine. What should I watch for while using this medicine? Visit your doctor or health care professional regularly. Tell your doctor if your symptoms do not start to get better or if they get worse. You may need blood work done while you are taking this medicine. You may need to follow a special diet. Talk to your doctor. Foods that contain iron include: whole grains/cereals, dried fruits, beans, or peas, leafy green vegetables, and organ meats (liver, kidney). What side effects may I notice from receiving this medicine? Side effects that you should report to your doctor or health care professional as soon as possible: -allergic reactions like skin rash, itching or hives, swelling of the face, lips, or tongue -breathing problems -changes in blood pressure -feeling faint or lightheaded, falls -flushing, sweating, or hot feelings Side effects that usually do not require medical attention (report to your doctor or health care professional if they continue or are bothersome): -changes in taste -constipation -dizziness -headache -nausea -pain, redness, or irritation at site where injected -vomiting This list may not describe all possible side effects. Call your doctor for medical advice about side effects. You may report side effects to FDA at 1-800-FDA-1088. Where should I keep my medicine? This drug is given in a hospital or clinic and will not be stored at home. NOTE: This sheet is a summary. It may not cover all possible information. If you have questions about this medicine, talk to your doctor, pharmacist, or health care provider.  2018 Elsevier/Gold Standard (2015-07-10 11:20:47)  

## 2017-12-14 ENCOUNTER — Ambulatory Visit (HOSPITAL_COMMUNITY)
Admission: RE | Admit: 2017-12-14 | Discharge: 2017-12-14 | Disposition: A | Discharge status: Home / Self Care | Payer: Medicare Other | Source: Ambulatory Visit | Attending: Cardiovascular Disease | Admitting: Cardiovascular Disease

## 2017-12-14 ENCOUNTER — Encounter: Payer: Self-pay | Admitting: Cardiovascular Disease

## 2017-12-14 ENCOUNTER — Ambulatory Visit (INDEPENDENT_AMBULATORY_CARE_PROVIDER_SITE_OTHER): Payer: Medicare Other | Admitting: Cardiovascular Disease

## 2017-12-14 VITALS — BP 154/68 | HR 62 | Ht 65.0 in | Wt 174.2 lb

## 2017-12-14 DIAGNOSIS — I2510 Atherosclerotic heart disease of native coronary artery without angina pectoris: Secondary | ICD-10-CM | POA: Diagnosis not present

## 2017-12-14 DIAGNOSIS — I739 Peripheral vascular disease, unspecified: Secondary | ICD-10-CM

## 2017-12-14 DIAGNOSIS — Z01818 Encounter for other preprocedural examination: Secondary | ICD-10-CM

## 2017-12-14 DIAGNOSIS — R0989 Other specified symptoms and signs involving the circulatory and respiratory systems: Secondary | ICD-10-CM

## 2017-12-14 DIAGNOSIS — I700 Atherosclerosis of aorta: Secondary | ICD-10-CM | POA: Diagnosis not present

## 2017-12-14 LAB — CBC
HEMOGLOBIN: 11 g/dL — AB (ref 13.0–17.7)
Hematocrit: 36.2 % — ABNORMAL LOW (ref 37.5–51.0)
MCH: 21.9 pg — ABNORMAL LOW (ref 26.6–33.0)
MCHC: 30.4 g/dL — ABNORMAL LOW (ref 31.5–35.7)
MCV: 72 fL — ABNORMAL LOW (ref 79–97)
PLATELETS: 414 10*3/uL (ref 150–450)
RBC: 5.02 x10E6/uL (ref 4.14–5.80)
RDW: 18.3 % — ABNORMAL HIGH (ref 12.3–15.4)
WBC: 7.1 10*3/uL (ref 3.4–10.8)

## 2017-12-14 LAB — PROTIME-INR
INR: 1 (ref 0.8–1.2)
PROTHROMBIN TIME: 10.8 s (ref 9.1–12.0)

## 2017-12-14 LAB — BASIC METABOLIC PANEL
BUN/Creatinine Ratio: 13 (ref 10–24)
BUN: 10 mg/dL (ref 8–27)
CHLORIDE: 100 mmol/L (ref 96–106)
CO2: 25 mmol/L (ref 20–29)
Calcium: 10.1 mg/dL (ref 8.6–10.2)
Creatinine, Ser: 0.8 mg/dL (ref 0.76–1.27)
GFR calc Af Amer: 106 mL/min/{1.73_m2} (ref >59)
GFR calc non Af Amer: 92 mL/min/{1.73_m2} (ref >59)
GLUCOSE: 175 mg/dL — AB (ref 65–99)
Potassium: 4.7 mmol/L (ref 3.5–5.2)
SODIUM: 141 mmol/L (ref 134–144)

## 2017-12-14 NOTE — H&P (View-Only) (Signed)
12/14/2017 Luke [name]   [birth year]  [id number]  Primary Physician Celene Squibb, MD Primary Cardiologist: Lorretta Harp MD Luke [name], [place]  HPI:  Luke [name] is a 67 y.o. thin appearing married Caucasian male father of 2, grandfather of 4 grandchildren is accompanied by his wife Luke [name].  Apparently I took care of her father 15 years ago, Luke [name].  He was referred by Beckie Busing, NP for peripheral vascular evaluation because of lifestyle limiting claudication.  His risk factors include greater than 100 pack years of tobacco abuse having quit 1 year ago, treated hypertension, diabetes and hyperlipidemia.  He is never had a heart attack or stroke.  He denies chest pain or shortness of breath.  He does have documented coronary disease by Dr. Claiborne Billings years ago but was never intervened on.  He is complained of lower extremity claudication for the last 4 years which has gone undiagnosed and treated as a "orthopedic issue".  Recent Dopplers performed in our office 12/12/2017 revealed a right ABI of 0.90 and left 0.51 with high-frequency signals in both iliac arteries.  I suspect this left common iliac is either occluded or or subtotally occluded.  He wishes to proceed with angiography and intervention.   Current Meds  Medication Sig  . amLODipine (NORVASC) 2.5 MG tablet Take 1 tablet (2.5 mg total) by mouth daily.  Marland Kitchen aspirin 81 MG tablet Take 1 tablet (81 mg total) by mouth daily. HOLD FOR 1 WEEK, THEN RESUME IF NO BLEEDING PROBLEMS (Patient taking differently: Take 81 mg by mouth daily. )  . atorvastatin (LIPITOR) 40 MG tablet TAKE 1 TABLET EVERY DAY  . bisoprolol-hydrochlorothiazide (ZIAC) 2.5-6.25 MG tablet TAKE 1 TABLET EVERY DAY  . cetirizine (ZYRTEC) 10 MG tablet Take 10 mg by mouth daily.  . empagliflozin (JARDIANCE) 10 MG TABS tablet Take 10 mg by mouth daily.  . ferrous sulfate 325 (65 FE) MG EC tablet Take 1 tablet (325 mg total) by mouth 2 (two) times daily after a meal.  (Patient taking differently: Take 325 mg by mouth daily with breakfast. Once a day)  . finasteride (PROSCAR) 5 MG tablet Take 5 mg by mouth daily.  . Insulin Glargine-Lixisenatide (SOLIQUA) 100-33 UNT-MCG/ML SOPN Inject 20 Units into the skin daily.   . metFORMIN (GLUMETZA) 1000 MG (MOD) 24 hr tablet Take 1,000 mg by mouth 2 (two) times daily.   . pantoprazole (PROTONIX) 40 MG tablet TAKE 1 TABLET EVERY DAY  . ramipril (ALTACE) 10 MG capsule TAKE 1 CAPSULE EVERY DAY  . tamsulosin (FLOMAX) 0.4 MG CAPS capsule Take 0.4 mg by mouth.     Allergies  Allergen Reactions  . Morphine And Related Itching    Social History   Socioeconomic History  . Marital status: Married    Spouse name: Not on file  . Number of children: Not on file  . Years of education: Not on file  . Highest education level: Not on file  Occupational History  . Not on file  Social Needs  . Financial resource strain: Not on file  . Food insecurity:    Worry: Not on file    Inability: Not on file  . Transportation needs:    Medical: Not on file    Non-medical: Not on file  Tobacco Use  . Smoking status: Former Smoker    Packs/day: 1.00    Years: 50.00    Pack years: 50.00    Types: Cigarettes    Last attempt to  quit: 09/28/2014    Years since quitting: 3.2  . Smokeless tobacco: Never Used  . Tobacco comment: patient smokes 1/2 pack per day  Substance and Sexual Activity  . Alcohol use: No    Alcohol/week: 0.0 oz  . Drug use: No  . Sexual activity: Yes  Lifestyle  . Physical activity:    Days per week: Not on file    Minutes per session: Not on file  . Stress: Not on file  Relationships  . Social connections:    Talks on phone: Not on file    Gets together: Not on file    Attends religious service: Not on file    Active member of club or organization: Not on file    Attends meetings of clubs or organizations: Not on file    Relationship status: Not on file  . Intimate partner violence:    Fear of  current or ex partner: Not on file    Emotionally abused: Not on file    Physically abused: Not on file    Forced sexual activity: Not on file  Other Topics Concern  . Not on file  Social History Narrative  . Not on file     Review of Systems: General: negative for chills, fever, night sweats or weight changes.  Cardiovascular: negative for chest pain, dyspnea on exertion, edema, orthopnea, palpitations, paroxysmal nocturnal dyspnea or shortness of breath Dermatological: negative for rash Respiratory: negative for cough or wheezing Urologic: negative for hematuria Abdominal: negative for nausea, vomiting, diarrhea, bright red blood per rectum, melena, or hematemesis Neurologic: negative for visual changes, syncope, or dizziness All other systems reviewed and are otherwise negative except as noted above.    Blood pressure (!) 154/68, pulse 62, height 5\' 5"  (1.651 m), weight 174 lb 3.2 oz (79 kg), SpO2 96 %.  General appearance: alert and no distress Neck: no adenopathy, no carotid bruit, no JVD, supple, symmetrical, trachea midline and thyroid not enlarged, symmetric, no tenderness/mass/nodules Lungs: clear to auscultation bilaterally Heart: regular rate and rhythm, S1, S2 normal, no murmur, click, rub or gallop Extremities: extremities normal, atraumatic, no cyanosis or edema Pulses: 2+ and symmetric Skin: Skin color, texture, turgor normal. No rashes or lesions Neurologic: Alert and oriented X 3, normal strength and tone. Normal symmetric reflexes. Normal coordination and gait  EKG not performed today  ASSESSMENT AND PLAN:   Peripheral arterial disease [facility]) Luke [name] was referred to me by [name] nurse practitioner for evaluation of symptomatic PAD.  He has had bilateral lower extremity claudication for the last 4 years.  He has treated for an orthopedic etiology and is seen an orthopedic surgeon.  His risk factors include over 100 pack years of tobacco abuse having  quit 1 year ago as well as treated hypertension, diabetes and hyperlipidemia.  He is never had a heart attack or stroke.  He has had CAD evaluated by Dr. Claiborne Billings years ago.  Denies chest pain or shortness of breath.  He said lower extremity symptoms last for years without a diagnosis.  Recent Dopplers performed 12/12/2017 revealed a right ABI 0.98, left 0.51 with high-grade iliac disease bilaterally.  He is symptomatic from this and is lifestyle limited.  He wishes to proceed with angiography and endovascular therapy.      Lorretta Harp MD FACP,FACC,FAHA, [facility] 12/14/2017 11:06 AM

## 2017-12-14 NOTE — Patient Instructions (Signed)
   Luke [name] [address] [place] [address] Dept: [phone] Loc: [phone]  Luke [name]  12/14/2017  You are scheduled for a Peripheral Angiogram on Monday, July 1 with Dr. Quay Burow.  1. Please arrive at the [facility] (Main Entrance A) at [facility]: [address] [place], [place] [address] at 5:30 AM (two hours before your procedure to ensure your preparation). Free valet parking service is available.   Special note: Every effort is made to have your procedure done on time. Please understand that emergencies sometimes delay scheduled procedures.  2. Diet: Do not eat or drink anything after midnight prior to your procedure except sips of water to take medications.  3. Labs: Your physician recommends that you HAVE LAB WORK TODAY  A chest x-ray takes a picture of the organs and structures inside the chest, including the heart, lungs, and blood vessels. This test can show several things, including, whether the heart is enlarges; whether fluid is building up in the lungs; and whether pacemaker / defibrillator leads are still in place. AT [facility]  4. Medication instructions in preparation for your procedure:  DO NOT TAKE JARDANCE THE MORNING OF THE PROCEDURE  DO NOT TAKE METFORMIN Monday, Tuesday OR Wednesday RESTART ON Thursday AFTER PROCEDURE  On the morning of your procedure, take your Aspirin and any morning medicines NOT listed above.  You may use sips of water.  5. Plan for one night stay--bring personal belongings. 6. Bring a current list of your medications and current insurance cards. 7. You MUST have a responsible person to drive you home. 8. Someone MUST be with you the first 24 hours after you arrive home or your discharge will be delayed. 9. Please wear clothes that are easy to get on and off and wear slip-on shoes.  Thank you for allowing [place] to  care for you!   -- [facility]

## 2017-12-14 NOTE — Assessment & Plan Note (Signed)
Mr. Fails was referred to me by [name] nurse practitioner for evaluation of symptomatic PAD.  He has had bilateral lower extremity claudication for the last 4 years.  He has treated for an orthopedic etiology and is seen an orthopedic surgeon.  His risk factors include over 100 pack years of tobacco abuse having quit 1 year ago as well as treated hypertension, diabetes and hyperlipidemia.  He is never had a heart attack or stroke.  He has had CAD evaluated by Dr. Claiborne Billings years ago.  Denies chest pain or shortness of breath.  He said lower extremity symptoms last for years without a diagnosis.  Recent Dopplers performed 12/12/2017 revealed a right ABI 0.98, left 0.51 with high-grade iliac disease bilaterally.  He is symptomatic from this and is lifestyle limited.  He wishes to proceed with angiography and endovascular therapy.

## 2017-12-14 NOTE — Progress Notes (Signed)
12/14/2017 Luke [name]   08/23/1950  [id number]  Primary Physician Celene Squibb, MD Primary Cardiologist: Lorretta Harp MD Lupe Carney, [place]  HPI:  Luke [name] is a 67 y.o. thin appearing married Caucasian male father of 2, grandfather of 4 grandchildren is accompanied by his wife Luke [name].  Apparently I took care of her father 15 years ago, Luke [name].  He was referred by Beckie Busing, NP for peripheral vascular evaluation because of lifestyle limiting claudication.  His risk factors include greater than 100 pack years of tobacco abuse having quit 1 year ago, treated hypertension, diabetes and hyperlipidemia.  He is never had a heart attack or stroke.  He denies chest pain or shortness of breath.  He does have documented coronary disease by Dr. Claiborne Billings years ago but was never intervened on.  He is complained of lower extremity claudication for the last 4 years which has gone undiagnosed and treated as a "orthopedic issue".  Recent Dopplers performed in our office 12/12/2017 revealed a right ABI of 0.90 and left 0.51 with high-frequency signals in both iliac arteries.  I suspect this left common iliac is either occluded or or subtotally occluded.  He wishes to proceed with angiography and intervention.   Current Meds  Medication Sig  . amLODipine (NORVASC) 2.5 MG tablet Take 1 tablet (2.5 mg total) by mouth daily.  Marland Kitchen aspirin 81 MG tablet Take 1 tablet (81 mg total) by mouth daily. HOLD FOR 1 WEEK, THEN RESUME IF NO BLEEDING PROBLEMS (Patient taking differently: Take 81 mg by mouth daily. )  . atorvastatin (LIPITOR) 40 MG tablet TAKE 1 TABLET EVERY DAY  . bisoprolol-hydrochlorothiazide (ZIAC) 2.5-6.25 MG tablet TAKE 1 TABLET EVERY DAY  . cetirizine (ZYRTEC) 10 MG tablet Take 10 mg by mouth daily.  . empagliflozin (JARDIANCE) 10 MG TABS tablet Take 10 mg by mouth daily.  . ferrous sulfate 325 (65 FE) MG EC tablet Take 1 tablet (325 mg total) by mouth 2 (two) times daily after a meal.  (Patient taking differently: Take 325 mg by mouth daily with breakfast. Once a day)  . finasteride (PROSCAR) 5 MG tablet Take 5 mg by mouth daily.  . Insulin Glargine-Lixisenatide (SOLIQUA) 100-33 UNT-MCG/ML SOPN Inject 20 Units into the skin daily.   . metFORMIN (GLUMETZA) 1000 MG (MOD) 24 hr tablet Take 1,000 mg by mouth 2 (two) times daily.   . pantoprazole (PROTONIX) 40 MG tablet TAKE 1 TABLET EVERY DAY  . ramipril (ALTACE) 10 MG capsule TAKE 1 CAPSULE EVERY DAY  . tamsulosin (FLOMAX) 0.4 MG CAPS capsule Take 0.4 mg by mouth.     Allergies  Allergen Reactions  . Morphine And Related Itching    Social History   Socioeconomic History  . Marital status: Married    Spouse name: Not on file  . Number of children: Not on file  . Years of education: Not on file  . Highest education level: Not on file  Occupational History  . Not on file  Social Needs  . Financial resource strain: Not on file  . Food insecurity:    Worry: Not on file    Inability: Not on file  . Transportation needs:    Medical: Not on file    Non-medical: Not on file  Tobacco Use  . Smoking status: Former Smoker    Packs/day: 1.00    Years: 50.00    Pack years: 50.00    Types: Cigarettes    Last attempt to  quit: 09/28/2014    Years since quitting: 3.2  . Smokeless tobacco: Never Used  . Tobacco comment: patient smokes 1/2 pack per day  Substance and Sexual Activity  . Alcohol use: No    Alcohol/week: 0.0 oz  . Drug use: No  . Sexual activity: Yes  Lifestyle  . Physical activity:    Days per week: Not on file    Minutes per session: Not on file  . Stress: Not on file  Relationships  . Social connections:    Talks on phone: Not on file    Gets together: Not on file    Attends religious service: Not on file    Active member of club or organization: Not on file    Attends meetings of clubs or organizations: Not on file    Relationship status: Not on file  . Intimate partner violence:    Fear of  current or ex partner: Not on file    Emotionally abused: Not on file    Physically abused: Not on file    Forced sexual activity: Not on file  Other Topics Concern  . Not on file  Social History Narrative  . Not on file     Review of Systems: General: negative for chills, fever, night sweats or weight changes.  Cardiovascular: negative for chest pain, dyspnea on exertion, edema, orthopnea, palpitations, paroxysmal nocturnal dyspnea or shortness of breath Dermatological: negative for rash Respiratory: negative for cough or wheezing Urologic: negative for hematuria Abdominal: negative for nausea, vomiting, diarrhea, bright red blood per rectum, melena, or hematemesis Neurologic: negative for visual changes, syncope, or dizziness All other systems reviewed and are otherwise negative except as noted above.    Blood pressure (!) 154/68, pulse 62, height 5\' 5"  (1.651 m), weight 174 lb 3.2 oz (79 kg), SpO2 96 %.  General appearance: alert and no distress Neck: no adenopathy, no carotid bruit, no JVD, supple, symmetrical, trachea midline and thyroid not enlarged, symmetric, no tenderness/mass/nodules Lungs: clear to auscultation bilaterally Heart: regular rate and rhythm, S1, S2 normal, no murmur, click, rub or gallop Extremities: extremities normal, atraumatic, no cyanosis or edema Pulses: 2+ and symmetric Skin: Skin color, texture, turgor normal. No rashes or lesions Neurologic: Alert and oriented X 3, normal strength and tone. Normal symmetric reflexes. Normal coordination and gait  EKG not performed today  ASSESSMENT AND PLAN:   Peripheral arterial disease [facility]) Mr. Bello was referred to me by [name] nurse practitioner for evaluation of symptomatic PAD.  He has had bilateral lower extremity claudication for the last 4 years.  He has treated for an orthopedic etiology and is seen an orthopedic surgeon.  His risk factors include over 100 pack years of tobacco abuse having  quit 1 year ago as well as treated hypertension, diabetes and hyperlipidemia.  He is never had a heart attack or stroke.  He has had CAD evaluated by Dr. Claiborne Billings years ago.  Denies chest pain or shortness of breath.  He said lower extremity symptoms last for years without a diagnosis.  Recent Dopplers performed 12/12/2017 revealed a right ABI 0.98, left 0.51 with high-grade iliac disease bilaterally.  He is symptomatic from this and is lifestyle limited.  He wishes to proceed with angiography and endovascular therapy.      Lorretta Harp MD FACP,FACC,FAHA, [facility] 12/14/2017 11:06 AM

## 2017-12-16 ENCOUNTER — Telehealth: Payer: Self-pay | Admitting: Cardiovascular Disease

## 2017-12-16 NOTE — Telephone Encounter (Signed)
New Message:       Dr. Wende Neighbors is calling and wanting to ask how soon after the pt's procedure on Monday 12/19/17 can he have a iron infusion.

## 2017-12-16 NOTE — Telephone Encounter (Signed)
Returned call to [name]  [name], MD's office she is asking when pt han have an iron infusion next week. S.w Dr Gwenlyn Found, he said he could have when ever they want to schedule is fine

## 2017-12-19 ENCOUNTER — Ambulatory Visit (HOSPITAL_COMMUNITY)
Admission: RE | Admit: 2017-12-19 | Discharge: 2017-12-20 | Disposition: A | Discharge status: Home / Self Care | Payer: Medicare Other | Source: Ambulatory Visit | Attending: Cardiovascular Disease | Admitting: Cardiovascular Disease

## 2017-12-19 ENCOUNTER — Other Ambulatory Visit: Payer: Self-pay

## 2017-12-19 ENCOUNTER — Ambulatory Visit (HOSPITAL_COMMUNITY)
Admission: RE | Disposition: A | Discharge status: Home / Self Care | Payer: Self-pay | Source: Ambulatory Visit | Attending: Cardiovascular Disease

## 2017-12-19 ENCOUNTER — Encounter (HOSPITAL_COMMUNITY): Payer: Self-pay | Admitting: Cardiovascular Disease

## 2017-12-19 DIAGNOSIS — I2510 Atherosclerotic heart disease of native coronary artery without angina pectoris: Secondary | ICD-10-CM | POA: Insufficient documentation

## 2017-12-19 DIAGNOSIS — I739 Peripheral vascular disease, unspecified: Secondary | ICD-10-CM

## 2017-12-19 DIAGNOSIS — E1151 Type 2 diabetes mellitus with diabetic peripheral angiopathy without gangrene: Secondary | ICD-10-CM | POA: Diagnosis not present

## 2017-12-19 DIAGNOSIS — E785 Hyperlipidemia, unspecified: Secondary | ICD-10-CM | POA: Insufficient documentation

## 2017-12-19 DIAGNOSIS — Z87891 Personal history of nicotine dependence: Secondary | ICD-10-CM | POA: Insufficient documentation

## 2017-12-19 DIAGNOSIS — R0989 Other specified symptoms and signs involving the circulatory and respiratory systems: Secondary | ICD-10-CM

## 2017-12-19 DIAGNOSIS — Z01818 Encounter for other preprocedural examination: Secondary | ICD-10-CM

## 2017-12-19 DIAGNOSIS — Z885 Allergy status to narcotic agent status: Secondary | ICD-10-CM | POA: Insufficient documentation

## 2017-12-19 DIAGNOSIS — Z794 Long term (current) use of insulin: Secondary | ICD-10-CM | POA: Insufficient documentation

## 2017-12-19 DIAGNOSIS — Z7982 Long term (current) use of aspirin: Secondary | ICD-10-CM | POA: Insufficient documentation

## 2017-12-19 DIAGNOSIS — I70212 Atherosclerosis of native arteries of extremities with intermittent claudication, left leg: Secondary | ICD-10-CM | POA: Diagnosis not present

## 2017-12-19 DIAGNOSIS — I10 Essential (primary) hypertension: Secondary | ICD-10-CM | POA: Diagnosis not present

## 2017-12-19 DIAGNOSIS — E119 Type 2 diabetes mellitus without complications: Secondary | ICD-10-CM

## 2017-12-19 HISTORY — PX: PERIPHERAL VASCULAR ATHERECTOMY: CATH118256

## 2017-12-19 HISTORY — DX: Type 2 diabetes mellitus without complications: E11.9

## 2017-12-19 HISTORY — DX: Personal history of other medical treatment: Z92.89

## 2017-12-19 HISTORY — PX: PERIPHERAL VASCULAR INTERVENTION: CATH118257

## 2017-12-19 HISTORY — PX: ABDOMINAL AORTOGRAM W/LOWER EXTREMITY: CATH118223

## 2017-12-19 LAB — GLUCOSE, CAPILLARY
GLUCOSE-CAPILLARY: 191 mg/dL — AB (ref 70–99)
GLUCOSE-CAPILLARY: 219 mg/dL — AB (ref 70–99)
Glucose-Capillary: 135 mg/dL — ABNORMAL HIGH (ref 70–99)
Glucose-Capillary: 174 mg/dL — ABNORMAL HIGH (ref 70–99)
Glucose-Capillary: 138 mg/dL — ABNORMAL HIGH (ref 70–99)

## 2017-12-19 LAB — POCT ACTIVATED CLOTTING TIME
ACTIVATED CLOTTING TIME: 224 s
ACTIVATED CLOTTING TIME: 224 s
Activated Clotting Time: 241 seconds
Activated Clotting Time: 158 seconds

## 2017-12-19 SURGERY — ABDOMINAL AORTOGRAM W/LOWER EXTREMITY
Anesthesia: LOCAL

## 2017-12-19 MED ORDER — FERROUS SULFATE 325 (65 FE) MG PO TABS
325.0000 mg | ORAL_TABLET | Freq: Two times a day (BID) | ORAL | Status: DC
  Filled 2017-12-19: qty 1

## 2017-12-19 MED ORDER — FENTANYL CITRATE (PF) 100 MCG/2ML IJ SOLN
INTRAMUSCULAR | Status: DC | PRN
  Administered 2017-12-19: 25 ug via INTRAVENOUS

## 2017-12-19 MED ORDER — ACETAMINOPHEN 325 MG PO TABS: 650.0000 mg | ORAL_TABLET | ORAL | Status: DC | PRN

## 2017-12-19 MED ORDER — HEPARIN SODIUM (PORCINE) 1000 UNIT/ML IJ SOLN
INTRAMUSCULAR | Status: DC | PRN
  Administered 2017-12-19: 8000 [IU] via INTRAVENOUS
  Administered 2017-12-19: 3000 [IU] via INTRAVENOUS

## 2017-12-19 MED ORDER — SODIUM CHLORIDE 0.9 % IV SOLN: 250.0000 mL | INTRAVENOUS | Status: DC | PRN

## 2017-12-19 MED ORDER — CLOPIDOGREL BISULFATE 300 MG PO TABS
ORAL_TABLET | ORAL | Status: AC
  Filled 2017-12-19: qty 1

## 2017-12-19 MED ORDER — MIDAZOLAM HCL 2 MG/2ML IJ SOLN
INTRAMUSCULAR | Status: DC | PRN
  Administered 2017-12-19: 1 mg via INTRAVENOUS

## 2017-12-19 MED ORDER — SODIUM CHLORIDE 0.9% FLUSH
3.0000 mL | Freq: Two times a day (BID) | INTRAVENOUS | Status: DC
  Administered 2017-12-19: 22:00:00 3 mL via INTRAVENOUS

## 2017-12-19 MED ORDER — SODIUM CHLORIDE 0.9% FLUSH: 3.0000 mL | INTRAVENOUS | Status: DC | PRN

## 2017-12-19 MED ORDER — ASPIRIN 81 MG PO CHEW: 81.0000 mg | CHEWABLE_TABLET | ORAL | Status: DC

## 2017-12-19 MED ORDER — ONDANSETRON HCL 4 MG/2ML IJ SOLN: 4.0000 mg | Freq: Four times a day (QID) | INTRAMUSCULAR | Status: DC | PRN

## 2017-12-19 MED ORDER — SODIUM CHLORIDE 0.9% FLUSH: 3.0000 mL | Freq: Two times a day (BID) | INTRAVENOUS | Status: DC

## 2017-12-19 MED ORDER — HEPARIN (PORCINE) IN NACL 2-0.9 UNITS/ML
INTRAMUSCULAR | Status: DC | PRN
  Administered 2017-12-19 (×2): 500 mL via INTRA_ARTERIAL

## 2017-12-19 MED ORDER — PANTOPRAZOLE SODIUM 40 MG PO TBEC: 40.0000 mg | DELAYED_RELEASE_TABLET | Freq: Every day | ORAL | Status: DC

## 2017-12-19 MED ORDER — FINASTERIDE 5 MG PO TABS: 5.0000 mg | ORAL_TABLET | Freq: Every day | ORAL | Status: DC

## 2017-12-19 MED ORDER — INSULIN DEGLUDEC-LIRAGLUTIDE 100-3.6 UNIT-MG/ML ~~LOC~~ SOPN: 26.0000 [IU] | PEN_INJECTOR | Freq: Every day | SUBCUTANEOUS | Status: DC

## 2017-12-19 MED ORDER — LABETALOL HCL 5 MG/ML IV SOLN: 10.0000 mg | INTRAVENOUS | Status: DC | PRN

## 2017-12-19 MED ORDER — SODIUM CHLORIDE 0.9 % IV SOLN: INTRAVENOUS | Status: AC

## 2017-12-19 MED ORDER — NITROGLYCERIN 1 MG/10 ML FOR IR/CATH LAB
INTRA_ARTERIAL | Status: DC | PRN
  Administered 2017-12-19 (×2): 200 ug via INTRA_ARTERIAL

## 2017-12-19 MED ORDER — RAMIPRIL 10 MG PO CAPS
10.0000 mg | ORAL_CAPSULE | Freq: Every day | ORAL | Status: DC
  Filled 2017-12-19: qty 1

## 2017-12-19 MED ORDER — SODIUM CHLORIDE 0.9 % WEIGHT BASED INFUSION: 1.0000 mL/kg/h | INTRAVENOUS | Status: DC

## 2017-12-19 MED ORDER — BISOPROLOL-HYDROCHLOROTHIAZIDE 2.5-6.25 MG PO TABS
1.0000 | ORAL_TABLET | Freq: Every day | ORAL | Status: DC
  Filled 2017-12-19: qty 1

## 2017-12-19 MED ORDER — VIPERSLIDE LUBRICANT OPTIME
TOPICAL | Status: DC | PRN
  Administered 2017-12-19: 08:00:00 via SURGICAL_CAVITY

## 2017-12-19 MED ORDER — IODIXANOL 320 MG/ML IV SOLN
INTRAVENOUS | Status: DC | PRN
  Administered 2017-12-19: 150 mL via INTRA_ARTERIAL

## 2017-12-19 MED ORDER — AMLODIPINE BESYLATE 2.5 MG PO TABS
2.5000 mg | ORAL_TABLET | Freq: Every day | ORAL | Status: DC
  Filled 2017-12-19: qty 1

## 2017-12-19 MED ORDER — FENTANYL CITRATE (PF) 100 MCG/2ML IJ SOLN
INTRAMUSCULAR | Status: AC
  Filled 2017-12-19: qty 2

## 2017-12-19 MED ORDER — LIDOCAINE HCL (PF) 1 % IJ SOLN
INTRAMUSCULAR | Status: DC | PRN
  Administered 2017-12-19 (×2): 30 mL via INTRADERMAL

## 2017-12-19 MED ORDER — MIDAZOLAM HCL 2 MG/2ML IJ SOLN
INTRAMUSCULAR | Status: AC
  Filled 2017-12-19: qty 2

## 2017-12-19 MED ORDER — INSULIN GLARGINE 100 UNIT/ML ~~LOC~~ SOLN
26.0000 [IU] | Freq: Every day | SUBCUTANEOUS | Status: DC
  Administered 2017-12-19: 22:00:00 26 [IU] via SUBCUTANEOUS
  Filled 2017-12-19: qty 0.26

## 2017-12-19 MED ORDER — TAMSULOSIN HCL 0.4 MG PO CAPS: 0.4000 mg | ORAL_CAPSULE | Freq: Every day | ORAL | Status: DC

## 2017-12-19 MED ORDER — INSULIN ASPART 100 UNIT/ML ~~LOC~~ SOLN
0.0000 [IU] | Freq: Three times a day (TID) | SUBCUTANEOUS | Status: DC
  Administered 2017-12-19: 5 [IU] via SUBCUTANEOUS
  Administered 2017-12-19: 3 [IU] via SUBCUTANEOUS
  Administered 2017-12-20: 2 [IU] via SUBCUTANEOUS

## 2017-12-19 MED ORDER — ATORVASTATIN CALCIUM 40 MG PO TABS: 40.0000 mg | ORAL_TABLET | Freq: Every day | ORAL | Status: DC

## 2017-12-19 MED ORDER — FERROUS SULFATE 325 (65 FE) MG PO TBEC: 325.0000 mg | DELAYED_RELEASE_TABLET | Freq: Two times a day (BID) | ORAL | Status: DC

## 2017-12-19 MED ORDER — CLOPIDOGREL BISULFATE 300 MG PO TABS
ORAL_TABLET | ORAL | Status: DC | PRN
  Administered 2017-12-19: 300 mg via ORAL

## 2017-12-19 MED ORDER — HYDRALAZINE HCL 20 MG/ML IJ SOLN: 5.0000 mg | INTRAMUSCULAR | Status: DC | PRN

## 2017-12-19 MED ORDER — SODIUM CHLORIDE 0.9 % WEIGHT BASED INFUSION
3.0000 mL/kg/h | INTRAVENOUS | Status: DC
  Administered 2017-12-19: 3 mL/kg/h via INTRAVENOUS

## 2017-12-19 MED ORDER — CLOPIDOGREL BISULFATE 75 MG PO TABS: 75.0000 mg | ORAL_TABLET | Freq: Every day | ORAL | Status: DC

## 2017-12-19 MED ORDER — ASPIRIN EC 81 MG PO TBEC: 81.0000 mg | DELAYED_RELEASE_TABLET | Freq: Every day | ORAL | Status: DC

## 2017-12-19 MED ORDER — LIDOCAINE HCL (PF) 1 % IJ SOLN
INTRAMUSCULAR | Status: AC
  Filled 2017-12-19: qty 30

## 2017-12-19 MED ORDER — ANGIOPLASTY BOOK
Freq: Once | Status: AC
  Administered 2017-12-19: 23:00:00
  Filled 2017-12-19: qty 1

## 2017-12-19 MED ORDER — ASPIRIN 81 MG PO TABS: 81.0000 mg | ORAL_TABLET | Freq: Every day | ORAL | Status: DC

## 2017-12-19 MED ORDER — HEPARIN (PORCINE) IN NACL 1000-0.9 UT/500ML-% IV SOLN
INTRAVENOUS | Status: AC
  Filled 2017-12-19: qty 500

## 2017-12-19 MED ORDER — HEPARIN SODIUM (PORCINE) 1000 UNIT/ML IJ SOLN
INTRAMUSCULAR | Status: AC
  Filled 2017-12-19: qty 2

## 2017-12-19 SURGICAL SUPPLY — 26 items
BALLN MUSTANG 5.0X40 75 (BALLOONS) ×3
BALLN MUSTANG 8.0X40 75 (BALLOONS) ×3
BALLOON MUSTANG 5.0X40 75 (BALLOONS) ×2 IMPLANT
BALLOON MUSTANG 8.0X40 75 (BALLOONS) ×2 IMPLANT
CATH ANGIO 5F PIGTAIL 65CM (CATHETERS) ×3 IMPLANT
CATH SOFT-VU 4F 65 STRAIGHT (CATHETERS) ×2 IMPLANT
CATH SOFT-VU STRAIGHT 4F 65CM (CATHETERS) ×1
DEVICE CONTINUOUS FLUSH (MISCELLANEOUS) ×3 IMPLANT
DIAMONDBACK SOLID OAS 2.0MM (CATHETERS) ×3
KIT ENCORE 26 ADVANTAGE (KITS) ×3 IMPLANT
KIT PV (KITS) ×3 IMPLANT
LUBRICANT VIPERSLIDE CORONARY (MISCELLANEOUS) ×3 IMPLANT
SHEATH BRITE TIP 7FR 35CM (SHEATH) ×3 IMPLANT
SHEATH PINNACLE 5F 10CM (SHEATH) ×3 IMPLANT
SHEATH PINNACLE 7F 10CM (SHEATH) ×3 IMPLANT
STENT VIABAHN 7X39X80 VBX (Permanent Stent) ×3 IMPLANT
STOPCOCK MORSE 400PSI 3WAY (MISCELLANEOUS) ×3 IMPLANT
SYRINGE MEDRAD AVANTA MACH 7 (SYRINGE) ×3 IMPLANT
SYSTEM DIMNDBCK SLD OAS 2.0MM (CATHETERS) ×2 IMPLANT
TAPE VIPERTRACK RADIOPAQ (MISCELLANEOUS) ×2 IMPLANT
TAPE VIPERTRACK RADIOPAQUE (MISCELLANEOUS) ×1
TRANSDUCER W/STOPCOCK (MISCELLANEOUS) ×3 IMPLANT
TRAY PV CATH (CUSTOM PROCEDURE TRAY) ×3 IMPLANT
TUBING CIL FLEX 10 FLL-RA (TUBING) ×3 IMPLANT
WIRE HITORQ VERSACORE ST 145CM (WIRE) ×3 IMPLANT
WIRE VIPER ADVANCE .017X335CM (WIRE) ×3 IMPLANT

## 2017-12-19 NOTE — Progress Notes (Signed)
Site area: left groin  Site Prior to Removal:  Level 0  Pressure Applied For 20 MINUTES    Minutes Beginning at 1140  Manual:   Yes.    Patient Status During Pull:  stable  Post Pull Groin Site:  Level 0  Post Pull Instructions Given:  Yes.    Post Pull Pulses Present:  Yes.    Dressing Applied:  Yes.    Site area: right groin  Site Prior to Removal:  Level 0  Pressure Applied For 20 MINUTES    Minutes Beginning at 1208  Manual:   Yes.    Patient Status During Pull:  Stable  Post Pull Groin Site:  Level 0  Post Pull Instructions Given:  Yes.    Post Pull Pulses Present:  Yes.    Dressing Applied:  Yes.    Comments:  Checked frequently during shift with no change

## 2017-12-19 NOTE — Interval H&P Note (Signed)
History and Physical Interval Note:  12/19/2017 7:31 AM  Luke [name]  has presented today for surgery, with the diagnosis of pad  The various methods of treatment have been discussed with the patient and family. After consideration of risks, benefits and other options for treatment, the patient has consented to  Procedure(s): ABDOMINAL AORTOGRAM W/LOWER EXTREMITY (N/A) as a surgical intervention .  The patient's history has been reviewed, patient examined, no change in status, stable for surgery.  I have reviewed the patient's chart and labs.  Questions were answered to the patient's satisfaction.     Quay Burow

## 2017-12-20 ENCOUNTER — Other Ambulatory Visit: Payer: Self-pay | Admitting: Medical

## 2017-12-20 ENCOUNTER — Encounter (HOSPITAL_COMMUNITY): Payer: Medicare Other

## 2017-12-20 DIAGNOSIS — Z885 Allergy status to narcotic agent status: Secondary | ICD-10-CM | POA: Diagnosis not present

## 2017-12-20 DIAGNOSIS — I739 Peripheral vascular disease, unspecified: Secondary | ICD-10-CM

## 2017-12-20 DIAGNOSIS — I10 Essential (primary) hypertension: Secondary | ICD-10-CM | POA: Diagnosis not present

## 2017-12-20 DIAGNOSIS — E1151 Type 2 diabetes mellitus with diabetic peripheral angiopathy without gangrene: Secondary | ICD-10-CM | POA: Diagnosis not present

## 2017-12-20 DIAGNOSIS — I2510 Atherosclerotic heart disease of native coronary artery without angina pectoris: Secondary | ICD-10-CM | POA: Diagnosis not present

## 2017-12-20 DIAGNOSIS — E785 Hyperlipidemia, unspecified: Secondary | ICD-10-CM | POA: Diagnosis not present

## 2017-12-20 DIAGNOSIS — Z7982 Long term (current) use of aspirin: Secondary | ICD-10-CM | POA: Diagnosis not present

## 2017-12-20 LAB — BASIC METABOLIC PANEL
Anion gap: 8 (ref 5–15)
BUN: 8 mg/dL (ref 8–23)
CHLORIDE: 102 mmol/L (ref 98–111)
CO2: 29 mmol/L (ref 22–32)
Calcium: 8.3 mg/dL — ABNORMAL LOW (ref 8.9–10.3)
Creatinine, Ser: 0.82 mg/dL (ref 0.61–1.24)
GFR calc non Af Amer: >60 mL/min (ref >60)
Glucose, Bld: 113 mg/dL — ABNORMAL HIGH (ref 70–99)
POTASSIUM: 3.6 mmol/L (ref 3.5–5.1)
SODIUM: 139 mmol/L (ref 135–145)

## 2017-12-20 LAB — GLUCOSE, CAPILLARY: Glucose-Capillary: 125 mg/dL — ABNORMAL HIGH (ref 70–99)

## 2017-12-20 LAB — CBC
HCT: 32.5 % — ABNORMAL LOW (ref 39.0–52.0)
HEMOGLOBIN: 9.3 g/dL — AB (ref 13.0–17.0)
MCH: 22.1 pg — ABNORMAL LOW (ref 26.0–34.0)
MCHC: 28.6 g/dL — ABNORMAL LOW (ref 30.0–36.0)
MCV: 77.2 fL — AB (ref 78.0–100.0)
Platelets: 256 10*3/uL (ref 150–400)
RBC: 4.21 MIL/uL — AB (ref 4.22–5.81)
RDW: 19.8 % — ABNORMAL HIGH (ref 11.5–15.5)
WBC: 5.7 10*3/uL (ref 4.0–10.5)

## 2017-12-20 MED ORDER — CLOPIDOGREL BISULFATE 75 MG PO TABS: 75.0000 mg | ORAL_TABLET | Freq: Every day | ORAL | 3 refills | Status: DC

## 2017-12-20 MED FILL — Heparin Sod (Porcine)-NaCl IV Soln 1000 Unit/500ML-0.9%: INTRAVENOUS | Qty: 1000 | Status: AC

## 2017-12-20 NOTE — Discharge Summary (Addendum)
Discharge Summary    Patient ID: Luke [name],  MRN: [id number], DOB/AGE: 07/02/1950 67 y.o.  Admit date: 12/19/2017 Discharge date: 12/20/2017  Primary Care Provider: Celene Squibb Primary Cardiologist: Shelva Majestic, MD  Discharge Diagnoses    Active Problems:   DM type 2 (diabetes mellitus, type 2) ([place])   HTN (hypertension)   Hyperlipemia   CAD (coronary artery disease)   Peripheral arterial disease (HCC)   Allergies Allergies  Allergen Reactions  . Morphine And Related Itching    Diagnostic Studies/Procedures     Peripheral Vascular Procedure 12/19/17: Procedures Performed:              1.  Right and left common femoral access              2.  Abdominal aortogram/bilateral iliac angiogram              3.  Diamondback orbital rotational atherectomy left common iliac artery              4.  VBX covered stenting left common iliac artery  Final Impression: Successful diamondback orbital rotational atherectomy, PTA and covered stenting using a VBX 7 mm by 89 mm covered stent was dilating up to 8 mm with an excellent angiographic result.  The patient received 300 mg of p.o. Plavix.  The sheath will be removed once ACT falls below 170 and pressure held.  He will be hydrated overnight and discharged home in the morning on aspirin and Plavix.  He will get aortoiliac Dopplers in our [facility] line office next week and I will see him back 2 to 3 weeks thereafter.  He left the lab in stable condition. _____________   History of Present Illness     Luke [name] is a 67 y.o. male father. He was referred by [name], NP for peripheral vascular evaluation because of lifestyle limiting claudication.  His risk factors include greater than 100 pack years of tobacco abuse having quit 1 year ago, treated hypertension, diabetes and hyperlipidemia.  He is never had a heart attack or stroke.  He denies chest pain or shortness of breath.  He does have documented coronary disease by Dr. Claiborne Billings  years ago but was never intervened on.  He complained of lower extremity claudication for the last 4 years which has gone undiagnosed and treated as a "orthopedic issue".  Recent Dopplers performed in our office 12/12/2017 revealed a right ABI of 0.90 and left 0.51 with high-frequency signals in both iliac arteries. It was suspected that his left common iliac is either occluded or subtotally occluded.  He wishes to proceed with angiography and intervention.    Hospital Course     Consultants: None   1. Peripheral vascular disease: He presented with complaints of claudication. Underwent an abdominal aortogram with lower extremity with Dr. Gwenlyn Found 12/19/17 and was found to have an occlusion of the left common iliac artery. This was managed with successful diamondback orbital rotational atherectomy and stenting. Patient tolerated the procedure well. Recommended for DAPT with ASA and plavix. He is scheduled for follow-up aortoliliac dopplers next week and follow-up with Dr. Gwenlyn Found in 2 weeks. Cr stable post procedure.  - Continue ASA and plavix  Remainder of home medications continued.   _____________  Discharge Vitals Blood pressure 138/60, pulse 67, temperature (!) 97.5 F (36.4 C), temperature source Oral, resp. rate (!) 25, height 5\' 5"  (1.651 m), weight 171 lb 15.3 oz (78 kg), SpO2 100 %.  Filed  Weights   12/19/17 0535 12/20/17 0649  Weight: 174 lb (78.9 kg) 171 lb 15.3 oz (78 kg)    TIR:WERXVQM upright in bedside chair in no acute distress.   Neck:No JVD, no carotid bruits Cardiac: RRR, no murmurs, rubs, or gallops. B/L groin site with minimal bruising, no hematoma or bleeding  Respiratory:Clear to auscultation bilaterally, mild expiratory wheeze; no rales/ rhonchi GQ:QPYP, Soft, nontender, non-distended  MS:No edema; No deformity. Neuro:Nonfocal, moving all extremities spontaneously Psych: Normal affect      Labs & Radiologic Studies    CBC Recent Labs    12/20/17 0223    WBC 5.7  HGB 9.3*  HCT 32.5*  MCV 77.2*  PLT 950   Basic Metabolic Panel Recent Labs    12/20/17 0223  NA 139  K 3.6  CL 102  CO2 29  GLUCOSE 113*  BUN 8  CREATININE 0.82  CALCIUM 8.3*   Liver Function Tests No results for input(s): AST, ALT, ALKPHOS, BILITOT, PROT, ALBUMIN in the last 72 hours. No results for input(s): LIPASE, AMYLASE in the last 72 hours. Cardiac Enzymes No results for input(s): CKTOTAL, CKMB, CKMBINDEX, TROPONINI in the last 72 hours. BNP Invalid input(s): POCBNP D-Dimer No results for input(s): DDIMER in the last 72 hours. Hemoglobin A1C No results for input(s): HGBA1C in the last 72 hours. Fasting Lipid Panel No results for input(s): CHOL, HDL, LDLCALC, TRIG, CHOLHDL, LDLDIRECT in the last 72 hours. Thyroid Function Tests No results for input(s): TSH, T4TOTAL, T3FREE, THYROIDAB in the last 72 hours.  Invalid input(s): FREET3 _____________  Dg Chest 2 View  Result Date: 12/15/2017 CLINICAL DATA:  Decreased pedal pulses, pre-op testing. Patient states that he is to have possible stents or balloon placed. Hx of CAD, diabetes, heart murmur, HTN- controlled with medication. Former smoker. EXAM: CHEST - 2 VIEW COMPARISON:  07/12/2012 FINDINGS: Lungs are clear. Heart size and mediastinal contours are within normal limits. Aortic Atherosclerosis (ICD10-170.0) No effusion. Visualized bones unremarkable. IMPRESSION: No acute cardiopulmonary disease. Electronically Signed   By: Lucrezia Europe M.D.   On: 12/15/2017 07:52   Disposition   Patient was seen and examined by Dr. Sallyanne Kuster who deemed patient as stable for discharge. Follow-up has been arranged. Discharge medications as listed below.   Follow-up Plans & Appointments    Follow-up Information    [facility] Follow up on 12/27/2017.   Specialty:  Cardiology Why:  Please arrive 15 minutes early for your 10:15am ultrasound appointment Contact information: [address] [place] [phone]       Lorretta Harp, MD Follow up on 01/03/2018.   Specialties:  Cardiology, Radiology Why:  Please arrive 15 minutes early for your 1:30pm appointment Contact information: [address] [place] [address] [phone]          Discharge Instructions    Diet - low sodium heart healthy   Complete by:  As directed    Increase activity slowly   Complete by:  As directed       Discharge Medications   Allergies as of 12/20/2017      Reactions   Morphine And Related Itching      Medication List    STOP taking these medications   ferrous gluconate 225 (27 Fe) MG tablet Commonly known as:  FERGON     TAKE these medications   amLODipine 2.5 MG tablet Commonly known as:  NORVASC Take 1 tablet (2.5 mg total) by mouth daily.  aspirin 81 MG tablet Take 1 tablet (81 mg total) by mouth daily. HOLD FOR 1 WEEK, THEN RESUME IF NO BLEEDING PROBLEMS   atorvastatin 40 MG tablet Commonly known as:  LIPITOR TAKE 1 TABLET EVERY DAY   bisoprolol-hydrochlorothiazide 2.5-6.25 MG tablet Commonly known as:  ZIAC TAKE 1 TABLET EVERY DAY   cetirizine 10 MG tablet Commonly known as:  ZYRTEC Take 10 mg by mouth daily.   clopidogrel 75 MG tablet Commonly known as:  PLAVIX Take 1 tablet (75 mg total) by mouth daily with breakfast.   ferrous sulfate 325 (65 FE) MG EC tablet Take 1 tablet (325 mg total) by mouth 2 (two) times daily after a meal.   finasteride 5 MG tablet Commonly known as:  PROSCAR Take 5 mg by mouth daily.   JARDIANCE 25 MG Tabs tablet Generic drug:  empagliflozin Take 25 mg by mouth daily.   metFORMIN 1000 MG (MOD) 24 hr tablet Commonly known as:  GLUMETZA Take 1,000 mg by mouth 2 (two) times daily.   pantoprazole 40 MG tablet Commonly known as:  PROTONIX TAKE 1 TABLET EVERY DAY   ramipril 10 MG capsule Commonly known as:  ALTACE TAKE 1 CAPSULE EVERY DAY   tamsulosin 0.4 MG Caps  capsule Commonly known as:  FLOMAX Take 0.4 mg by mouth.   XULTOPHY 100-3.6 UNIT-MG/ML Sopn Generic drug:  Insulin Degludec-Liraglutide Inject 26 Units into the skin daily.         Outstanding Labs/Studies   Aortoiliac doppler scheduled for next week.   Duration of Discharge Encounter   Greater than 30 minutes including physician time.  Signed, Abigail Butts PA-C 12/20/2017, 8:47 AM  I have seen and examined the patient along with Abigail Butts PA-C.  I have reviewed the chart, notes and new data.  I agree with PA/NP's note.  Key new complaints: "first time I have walked without pain in 4 years" Key examination changes: no complications at access site Key new findings / data: small drop in Hgb. Note microcytic MCV - c/w preexisting Fe deficiency anemia.  PLAN: DC today, f/u with Dr. Gwenlyn Found.  Note history of GI bleeding and extensive workup in 2016 and 2019. He is on Fe supplement.  Sanda Klein, MD, [place] [phone] 12/20/2017, 8:53 AM

## 2017-12-20 NOTE — Progress Notes (Signed)
Patient ambulating in hall with no pain, Stated, " I haven't been able to walk this far in 4 years.". Patient stated if he walked to the end of the hall he wold be in awful pain and unable to walk any further. Steady gait ambulating independently.

## 2017-12-21 ENCOUNTER — Other Ambulatory Visit: Payer: Self-pay | Admitting: Cardiovascular Disease

## 2017-12-21 ENCOUNTER — Other Ambulatory Visit: Payer: Self-pay | Admitting: Medical

## 2017-12-21 DIAGNOSIS — I739 Peripheral vascular disease, unspecified: Secondary | ICD-10-CM

## 2017-12-26 ENCOUNTER — Other Ambulatory Visit (HOSPITAL_COMMUNITY): Payer: Self-pay | Admitting: *Deleted

## 2017-12-26 DIAGNOSIS — Z6829 Body mass index (BMI) 29.0-29.9, adult: Secondary | ICD-10-CM | POA: Diagnosis not present

## 2017-12-26 DIAGNOSIS — D509 Iron deficiency anemia, unspecified: Secondary | ICD-10-CM | POA: Diagnosis not present

## 2017-12-27 ENCOUNTER — Encounter (HOSPITAL_COMMUNITY): Payer: Medicare Other

## 2017-12-27 ENCOUNTER — Encounter (HOSPITAL_COMMUNITY)
Admission: RE | Admit: 2017-12-27 | Discharge: 2017-12-27 | Disposition: A | Discharge status: Home / Self Care | Payer: Medicare Other | Source: Ambulatory Visit | Attending: Internal Medicine | Admitting: Internal Medicine

## 2017-12-27 DIAGNOSIS — D509 Iron deficiency anemia, unspecified: Secondary | ICD-10-CM | POA: Insufficient documentation

## 2017-12-27 MED ORDER — SODIUM CHLORIDE 0.9 % IV SOLN
INTRAVENOUS | Status: DC
  Administered 2017-12-27: 250 mL via INTRAVENOUS

## 2017-12-27 MED ORDER — SODIUM CHLORIDE 0.9 % IV SOLN
750.0000 mg | Freq: Once | INTRAVENOUS | Status: AC
  Administered 2017-12-27: 750 mg via INTRAVENOUS
  Filled 2017-12-27: qty 15

## 2017-12-29 ENCOUNTER — Ambulatory Visit (HOSPITAL_COMMUNITY)
Admission: RE | Admit: 2017-12-29 | Discharge: 2017-12-29 | Disposition: A | Discharge status: Home / Self Care | Payer: Medicare Other | Source: Ambulatory Visit | Attending: Cardiology | Admitting: Cardiology

## 2017-12-29 ENCOUNTER — Telehealth: Payer: Self-pay | Admitting: Cardiovascular Disease

## 2017-12-29 DIAGNOSIS — I2510 Atherosclerotic heart disease of native coronary artery without angina pectoris: Secondary | ICD-10-CM | POA: Diagnosis not present

## 2017-12-29 DIAGNOSIS — I10 Essential (primary) hypertension: Secondary | ICD-10-CM | POA: Diagnosis not present

## 2017-12-29 DIAGNOSIS — E782 Mixed hyperlipidemia: Secondary | ICD-10-CM | POA: Diagnosis not present

## 2017-12-29 DIAGNOSIS — I739 Peripheral vascular disease, unspecified: Secondary | ICD-10-CM | POA: Diagnosis not present

## 2017-12-29 DIAGNOSIS — E1165 Type 2 diabetes mellitus with hyperglycemia: Secondary | ICD-10-CM | POA: Diagnosis not present

## 2017-12-29 NOTE — Telephone Encounter (Signed)
Marisa Hua at PCP office, they are attempting to control his DM, they were asking if it were possible to change from the bisoprolol-hydrochlorothiazide medication and change to Norvasc to help better control blood sugars. I advised the pharmacist who stated it would be best for him to come into see them to discuss changing it. Call back number to PCP office [phone] Ovid Curd or call back Amy [phone] to notify that we need him to be seen before changing.  Patient made appointment with pharmacist. Verbalized understanding and had no questions or concerns at this time.

## 2017-12-29 NOTE — Telephone Encounter (Signed)
Ovid Curd will Dr. Juel Burrow office calling with questions on why pt has been on several different meds-pls call [phone]

## 2017-12-29 NOTE — Telephone Encounter (Signed)
Called and spoke with Amy at [place] office, advised her of message below. No questions or concerns.

## 2018-01-02 ENCOUNTER — Other Ambulatory Visit: Payer: Self-pay | Admitting: *Deleted

## 2018-01-02 DIAGNOSIS — I739 Peripheral vascular disease, unspecified: Secondary | ICD-10-CM

## 2018-01-03 ENCOUNTER — Ambulatory Visit: Payer: Medicare Other | Admitting: Cardiovascular Disease

## 2018-01-08 NOTE — Progress Notes (Signed)
Cardiology Office Note   Date:  01/09/2018   ID:  Luke [name], DOB 1950/11/18, MRN [id number]  PCP:  Luke Squibb, MD  Cardiologist:  Dr. Claiborne Billings Chief Complaint  Patient presents with  . Follow-up    had 2 stents placed, denies chest pains, SOB, swelling     History of Present Illness: Luke [name] is a 67 y.o. male who presents for posthospitalization follow-up after undergoing PAD evaluation.  The patient was complaining of significant claudication.  The patient has a history of tobacco abuse, hypertension, diabetes and hyperlipidemia.  The patient had complained of lower extremity claudication for the last 4 years which have been undiagnosed and treated as a "orthopedic issue".  The patient had Dopplers performed on 12/12/2017 revealing a right ABI of 0.90 and a left ABI of 0.51 with high-frequency signals in both iliac arteries.  The patient underwent abdominal aortic aortogram with lower extremity runoff by Dr. Alvester Chou on 12/19/2017 and was [name] to have an occlusion of the left common iliac artery.  This was intervened with successful diamondback orbital rotational atherectomy and stenting.  The patient was recommended for dual antiplatelet therapy with aspirin and Plavix.  He was to be scheduled for follow-up aortic iliac Dopplers and to follow-up with Dr. Alvester Chou thereafter.He is very grateful to Dr. Gwenlyn [name] for "giving me my life back." He has gone to the beach, walks without pain, and is going back to playing golf.   ABI completed on 12/29/2017:  Right 0.95;, left 0.89 (improved from 0.51).  Past Medical History:  Diagnosis Date  . CAD (coronary artery disease) 10/02/2010   nuclear study show normal perfusion on medical therapy without scar or ischemia. EF 64%  . GERD (gastroesophageal reflux disease)   . Heart murmur    "mild one" (12/19/2017)  . Hematochezia 11/19/2009  . History of blood transfusion    "low HgB" (12/19/2017)  . Hyperlipidemia   . Hypertension   . Iron deficiency anemia  11/19/2009  . Osteoarthritis    "was in my knees" (12/19/2017)  . PAD (peripheral artery disease) ([place])   . 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 Sleep apnea    "trial mask in the 1990s; haven't used one since" (12/19/2017)  . Type II diabetes mellitus ([place])     Past Surgical History:  Procedure Laterality Date  . ABDOMINAL AORTOGRAM W/LOWER EXTREMITY N/A 12/19/2017   Procedure: ABDOMINAL AORTOGRAM W/LOWER EXTREMITY;  Surgeon: Lorretta Harp, MD;  Location: [facility];  Service: Cardiovascular;  Laterality: N/A;  . CARDIAC CATHETERIZATION  08/1999   which revealed a mild coronary artery disease with 60 and 70 and 80% stenosis in a small first diagonal vessel at the LAD, 50% mid LAD stenosis, 50% ostial left circumflrx stenosis, and 40% ostial and proximal intermediate stenosis. he had 10 to 20% irregularities of his mid right coronary artery.  . CHOLECYSTECTOMY N/A 07/08/2017   Procedure: LAPAROSCOPIC CHOLECYSTECTOMY;  Surgeon: Aviva Signs, MD;  Location: [facility];  Service: General;  Laterality: N/A;  . CIRCUMCISION  07/17/2012   Procedure: CIRCUMCISION ADULT;  Surgeon: Dutch Gray, MD;  Location: [facility];  Service: Urology;  Laterality: N/A;  . COLONOSCOPY  02/14/06   M JENKINS  . COLONOSCOPY N/A 10/28/2014   Procedure: COLONOSCOPY;  Surgeon: Rogene Houston, MD;  [facility]: [facility];  Service: Endoscopy;  Laterality: N/A;  730  . ENTEROSCOPY N/A 10/04/2014   Procedure: ENTEROSCOPY;  Surgeon: Carol Ada, MD;  Location: [facility];  Service: Endoscopy;  Laterality:  N/A;  . ERCP  12/25/1996   ROURK  . GIVENS CAPSULE STUDY N/A 10/04/2014   Procedure: GIVENS CAPSULE STUDY;  Surgeon: Carol Ada, MD;  Location: [facility];  Service: Endoscopy;  Laterality: N/A;  . JOINT REPLACEMENT    . PERIPHERAL VASCULAR ATHERECTOMY Left 12/19/2017   Procedure: PERIPHERAL VASCULAR ATHERECTOMY;  Surgeon: Lorretta Harp, MD;  Location: [facility];  Service: Cardiovascular;  Laterality:  Left;  . PERIPHERAL VASCULAR INTERVENTION Left 12/19/2017   Procedure: PERIPHERAL VASCULAR INTERVENTION;  Surgeon: Lorretta Harp, MD;  Location: [facility];  Service: Cardiovascular;  Laterality: Left;  stent  . SMALL BOWEL GIVENS  11/25/2008  . TOTAL KNEE ARTHROPLASTY  2007-2008  . UPPER GASTROINTESTINAL ENDOSCOPY  11/08/2008   EGD TCS     Current Outpatient Medications  Medication Sig Dispense Refill  . amLODipine (NORVASC) 2.5 MG tablet Take 1 tablet (2.5 mg total) by mouth daily. 180 tablet 1  . aspirin 81 MG tablet Take 1 tablet (81 mg total) by mouth daily. HOLD FOR 1 WEEK, THEN RESUME IF NO BLEEDING PROBLEMS 30 tablet   . atorvastatin (LIPITOR) 40 MG tablet Take 1 tablet (40 mg total) by mouth daily. 90 tablet 1  . bisoprolol-hydrochlorothiazide (ZIAC) 2.5-6.25 MG tablet Take 1 tablet by mouth daily. 90 tablet 1  . cetirizine (ZYRTEC) 10 MG tablet Take 10 mg by mouth daily.    . clopidogrel (PLAVIX) 75 MG tablet Take 1 tablet (75 mg total) by mouth daily with breakfast. 180 tablet 1  . empagliflozin (JARDIANCE) 25 MG TABS tablet Take 25 mg by mouth daily.     . ferrous sulfate 325 (65 FE) MG EC tablet Take 1 tablet (325 mg total) by mouth 2 (two) times daily after a meal.  3  . finasteride (PROSCAR) 5 MG tablet Take 5 mg by mouth daily.    . Insulin Degludec-Liraglutide (XULTOPHY) 100-3.6 UNIT-MG/ML SOPN Inject 26 Units into the skin daily.    . metFORMIN (GLUMETZA) 1000 MG (MOD) 24 hr tablet Take 1,000 mg by mouth 2 (two) times daily.     . pantoprazole (PROTONIX) 40 MG tablet TAKE 1 TABLET EVERY DAY 90 tablet 3  . ramipril (ALTACE) 10 MG capsule TAKE 1 CAPSULE EVERY DAY 90 capsule 3  . tamsulosin (FLOMAX) 0.4 MG CAPS capsule Take 0.4 mg by mouth.     No current facility-administered medications for this visit.     Allergies:   Morphine and related    Social History:  The patient  reports that he quit smoking about 3 years ago. His smoking use included cigarettes.  He has a 100.00 pack-year smoking history. He has never used smokeless tobacco. He reports that he does not drink alcohol or use drugs.   Family History:  The patient's family history includes Healthy in his brother, brother, brother, daughter, daughter, sister, and sister.    ROS: All other systems are reviewed and negative. Unless otherwise mentioned in H&P    PHYSICAL EXAM: VS:  BP 120/78 (BP Location: Left Arm, Patient Position: Sitting)   Pulse 72   Ht 5\' 5"  (1.651 m)   Wt 177 lb 9.6 oz (80.6 kg)   SpO2 96%   BMI 29.55 kg/m  , BMI Body mass index is 29.55 kg/m. GEN: Well nourished, well developed, in no acute distress  HEENT: normal  Neck: no JVD, carotid bruits, or masses Cardiac:RRR; no murmurs, rubs, or gallops,no edema  Respiratory:  clear to auscultation bilaterally, normal work  of breathing GI: soft, nontender, nondistended, + BS MS: no deformity or atrophy  Skin: warm and dry, no rash Neuro:  Strength and sensation are intact Psych: euthymic mood, full affect   EKG: Not completed that office visit.   Recent Labs: 07/07/2017: ALT 17 07/08/2017: Magnesium 1.7 12/20/2017: BUN 8; Creatinine, Ser 0.82; Hemoglobin 9.3; Platelets 256; Potassium 3.6; Sodium 139    Lipid Panel    Component Value Date/Time   CHOL 149 03/05/2016 0830   TRIG 133 03/05/2016 0830   HDL 36 (L) 03/05/2016 0830   LDLCALC 86 03/05/2016 0830      Wt Readings from Last 3 Encounters:  01/09/18 177 lb 9.6 oz (80.6 kg)  12/20/17 171 lb 15.3 oz (78 kg)  12/14/17 174 lb 3.2 oz (79 kg)      Other studies Reviewed: 12/19/2017 Pre Procedure Diagnosis: Peripheral arterial disease  Post Procedure Diagnosis: Full arterial disease  Operators: Dr. Quay Burow  Procedures Performed:              1.  Right and left common femoral access              2.  Abdominal aortogram/bilateral iliac angiogram              3.  Diamondback orbital rotational atherectomy left common iliac artery               4.  VBX covered stenting left common iliac artery  12/29/2017 VAS [place] with ABI. +-------+-----------+-----------+------------+------------+ ABI/TBIToday's ABIToday's TBIPrevious ABIPrevious TBI +-------+-----------+-----------+------------+------------+ Right 0.95    0.43    0.98    0.61     +-------+-----------+-----------+------------+------------+ Left  0.89    0.45    0.51    0       +-------+-----------+-----------+------------+------------+  Compared to prior study on 6/19. Left ABIs appear increased compared to prior study on 6/19.   ASSESSMENT AND PLAN:  1. PAD: S/P rotational atherectomy of the left common iliac artery. He is doing very well, he is more active and very grateful. He is tolerating the Plavix and ASA without excessive bleeding. Continue current regimen.   2. Hypertension: Had some hypotension at home the first day after discharge but has stabilized., now correlating with BP taken today. No changes in his regimen. I have advised him to keep track of it. May need to stop HCTZ if he becomes hypotensive, especially now that he is more active outside. I have also advised him to avoid extreme temperatures in the summer to avoid dehydration. He verbalizes understanding. He plays golf in the early morning hours.   3. CAD: Hx of LAD with 50% stenosis, ostial CX of 50% stenosis. Mild luminal irregularity in the RCA. He is without cardiac complaints.  Continue secondary prevention.   4. Diabetes: He is to follow PCP for this.   5. Hypercholesterolemia: Continue statin therapy. Follow labs in 6 months.   Current medicines are reviewed at length with the patient today.    Labs/ tests ordered today include: None   Phill Myron. West Pugh, ANP, AACC   01/09/2018 8:50 AM    [facility] [address] [place], [place] [address] Phone: [phone]; Fax: [phone]

## 2018-01-09 ENCOUNTER — Ambulatory Visit (INDEPENDENT_AMBULATORY_CARE_PROVIDER_SITE_OTHER): Payer: Medicare Other | Admitting: Adult Health

## 2018-01-09 ENCOUNTER — Encounter: Payer: Self-pay | Admitting: Adult Health

## 2018-01-09 VITALS — BP 120/78 | HR 72 | Ht 65.0 in | Wt 177.6 lb

## 2018-01-09 DIAGNOSIS — I739 Peripheral vascular disease, unspecified: Secondary | ICD-10-CM

## 2018-01-09 DIAGNOSIS — I10 Essential (primary) hypertension: Secondary | ICD-10-CM | POA: Diagnosis not present

## 2018-01-09 DIAGNOSIS — E7800 Pure hypercholesterolemia, unspecified: Secondary | ICD-10-CM

## 2018-01-09 DIAGNOSIS — I2510 Atherosclerotic heart disease of native coronary artery without angina pectoris: Secondary | ICD-10-CM | POA: Diagnosis not present

## 2018-01-09 MED ORDER — ATORVASTATIN CALCIUM 40 MG PO TABS: 40.0000 mg | ORAL_TABLET | Freq: Every day | ORAL | 1 refills | Status: DC

## 2018-01-09 MED ORDER — BISOPROLOL-HYDROCHLOROTHIAZIDE 2.5-6.25 MG PO TABS: 1.0000 | ORAL_TABLET | Freq: Every day | ORAL | 1 refills | Status: DC

## 2018-01-09 MED ORDER — AMLODIPINE BESYLATE 2.5 MG PO TABS: 2.5000 mg | ORAL_TABLET | Freq: Every day | ORAL | 1 refills | Status: DC

## 2018-01-09 MED ORDER — CLOPIDOGREL BISULFATE 75 MG PO TABS: 75.0000 mg | ORAL_TABLET | Freq: Every day | ORAL | 1 refills | Status: DC

## 2018-01-09 NOTE — Patient Instructions (Signed)
Medication Instructions:  NO CHANGES- Your physician recommends that you continue on your current medications as directed. Please refer to the Current Medication list given to you today.  If you need a refill on your cardiac medications before your next appointment, please call your pharmacy.  Follow-Up: Your physician wants you to follow-up in: 3-4 MONTHS WITH DR Gwenlyn Found   Thank you for choosing [facility] at [facility]!!

## 2018-01-10 ENCOUNTER — Telehealth: Payer: Self-pay | Admitting: Cardiovascular Disease

## 2018-01-10 NOTE — Telephone Encounter (Signed)
Need to speak to nurse concerning pt because pt was told by our office to double up on his Bisoprolol 6.25mg  and pt refused to because of side affects. And do pt need to stay on Aspirin and Clopidogrel 75mg . Please advise

## 2018-01-10 NOTE — Telephone Encounter (Signed)
Spoke with Amy with Dr. [name] and O[facility] C[facility] and they are wanting to put the pt on Metformin 1000mg  a day but the pt refused until he spoke with our office. Advised her that he has an appointment with our [facility] 01/17/18 and she says that Dr. [name] says okay to wait until after that appointment and I will forward the information on for the appointment. She was appreciative. She was also advised that the patient is to continue Plavix and Aspirin.

## 2018-01-10 NOTE — Telephone Encounter (Signed)
Noted in appointment notes

## 2018-01-11 DIAGNOSIS — D72829 Elevated white blood cell count, unspecified: Secondary | ICD-10-CM | POA: Diagnosis not present

## 2018-01-11 DIAGNOSIS — E782 Mixed hyperlipidemia: Secondary | ICD-10-CM | POA: Diagnosis not present

## 2018-01-11 DIAGNOSIS — E1165 Type 2 diabetes mellitus with hyperglycemia: Secondary | ICD-10-CM | POA: Diagnosis not present

## 2018-01-11 DIAGNOSIS — D509 Iron deficiency anemia, unspecified: Secondary | ICD-10-CM | POA: Diagnosis not present

## 2018-01-11 DIAGNOSIS — I10 Essential (primary) hypertension: Secondary | ICD-10-CM | POA: Diagnosis not present

## 2018-01-16 DIAGNOSIS — M79675 Pain in left toe(s): Secondary | ICD-10-CM | POA: Diagnosis not present

## 2018-01-16 DIAGNOSIS — E1151 Type 2 diabetes mellitus with diabetic peripheral angiopathy without gangrene: Secondary | ICD-10-CM | POA: Diagnosis not present

## 2018-01-16 DIAGNOSIS — B351 Tinea unguium: Secondary | ICD-10-CM | POA: Diagnosis not present

## 2018-01-16 DIAGNOSIS — L851 Acquired keratosis [keratoderma] palmaris et plantaris: Secondary | ICD-10-CM | POA: Diagnosis not present

## 2018-01-16 DIAGNOSIS — M79674 Pain in right toe(s): Secondary | ICD-10-CM | POA: Diagnosis not present

## 2018-01-17 ENCOUNTER — Ambulatory Visit (INDEPENDENT_AMBULATORY_CARE_PROVIDER_SITE_OTHER): Payer: Medicare Other | Admitting: Pharmacist Clinician (PhC)/ Clinical Pharmacy Specialist

## 2018-01-17 DIAGNOSIS — I2510 Atherosclerotic heart disease of native coronary artery without angina pectoris: Secondary | ICD-10-CM | POA: Diagnosis not present

## 2018-01-17 DIAGNOSIS — I10 Essential (primary) hypertension: Secondary | ICD-10-CM

## 2018-01-17 NOTE — Progress Notes (Signed)
01/17/2018 Luke [name] 09-14-1950 [id number]   HPI:  Luke [name] is a 67 y.o. male patient of Dr Gwenlyn Found, with a PMH below who presents today for hypertension clinic evaluation.  In addition to hypertension, her medical history is significant for PAD (post stenting), hyperlipidemia, CAD and DM2.    Today he is here because of some recommendations made by a pharmacist evaluation at his PCP office.  It was suggested that he discontinue his bisoprolol/hctz combination due to excessive daytime urination and DM2, and they also asked if there would be any cardiac concern for him to switch metformin to the controlled release formulation.    Patient reports that his hypertension has been mostly well controlled until just the last year or so.  He has no problems with compliance.    Blood Pressure Goal:  130/80  Current Medications:  Amlodipine 2.5 mg qam  Bisop/hctz qam  Ramipril 10 mg qam   Social Hx:  Former smoker, quit about 3 years ago; no alcohol  Exercise:  Golf 18 holes each Friday; has been more active since having PAD stents  Home BP readings:  None  Intolerances:   morphine Labs:  12/2017:  Na 139, K 3.6, Glu 113, BUN 8, SCr 0.82 Wt Readings from Last 3 Encounters:  01/09/18 177 lb 9.6 oz (80.6 kg)  12/20/17 171 lb 15.3 oz (78 kg)  12/14/17 174 lb 3.2 oz (79 kg)   BP Readings from Last 3 Encounters:  01/09/18 120/78  12/27/17 (!) 165/74  12/20/17 138/60   Pulse Readings from Last 3 Encounters:  01/09/18 72  12/27/17 82  12/20/17 67    Current Outpatient Medications  Medication Sig Dispense Refill  . amLODipine (NORVASC) 2.5 MG tablet Take 1 tablet (2.5 mg total) by mouth daily. 180 tablet 1  . aspirin 81 MG tablet Take 1 tablet (81 mg total) by mouth daily. HOLD FOR 1 WEEK, THEN RESUME IF NO BLEEDING PROBLEMS 30 tablet   . atorvastatin (LIPITOR) 40 MG tablet Take 1 tablet (40 mg total) by mouth daily. 90 tablet 1  . bisoprolol-hydrochlorothiazide (ZIAC)  2.5-6.25 MG tablet Take 1 tablet by mouth daily. 90 tablet 1  . cetirizine (ZYRTEC) 10 MG tablet Take 10 mg by mouth daily.    . clopidogrel (PLAVIX) 75 MG tablet Take 1 tablet (75 mg total) by mouth daily with breakfast. 180 tablet 1  . empagliflozin (JARDIANCE) 25 MG TABS tablet Take 25 mg by mouth daily.     . ferrous sulfate 325 (65 FE) MG EC tablet Take 1 tablet (325 mg total) by mouth 2 (two) times daily after a meal.  3  . finasteride (PROSCAR) 5 MG tablet Take 5 mg by mouth daily.    . Insulin Degludec-Liraglutide (XULTOPHY) 100-3.6 UNIT-MG/ML SOPN Inject 26 Units into the skin daily.    . metFORMIN (GLUMETZA) 1000 MG (MOD) 24 hr tablet Take 1,000 mg by mouth 2 (two) times daily.     . pantoprazole (PROTONIX) 40 MG tablet TAKE 1 TABLET EVERY DAY 90 tablet 3  . ramipril (ALTACE) 10 MG capsule TAKE 1 CAPSULE EVERY DAY 90 capsule 3  . tamsulosin (FLOMAX) 0.4 MG CAPS capsule Take 0.4 mg by mouth.     No current facility-administered medications for this visit.     Allergies  Allergen Reactions  . Morphine And Related Itching    Past Medical History:  Diagnosis Date  . CAD (coronary artery disease) 10/02/2010   nuclear study show normal perfusion  on medical therapy without scar or ischemia. EF 64%  . GERD (gastroesophageal reflux disease)   . Heart murmur    "mild one" (12/19/2017)  . Hematochezia 11/19/2009  . History of blood transfusion    "low HgB" (12/19/2017)  . Hyperlipidemia   . Hypertension   . Iron deficiency anemia 11/19/2009  . Osteoarthritis    "was in my knees" (12/19/2017)  . PAD (peripheral artery disease) ([place])   . 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 Sleep apnea    "trial mask in the 1990s; haven't used one since" (12/19/2017)  . Type II diabetes mellitus ([place])     There were no vitals taken for this visit.  No problem-specific Assessment & Plan notes found for this encounter.   Tommy Medal PharmD CPP [facility] [address] [place],  [address] [phone]

## 2018-01-17 NOTE — Patient Instructions (Addendum)
Return for a a follow up appointment with Dr. Gwenlyn Found on Friday and Dr. [name] in August  Your blood pressure today is 146/72  Check your blood pressure at home 3-4 times per week and keep record of the readings.  Take your BP meds as follows:  Stop bisoprolol hydrochlorothiazide   Increase amlodipine to 5 mg once daily.  Take 2 of the 2.5 mg tablets until they are gone, then start with the 5 mg tablets.  Bring all of your meds, your BP cuff and your record of home blood pressures to your next appointment.  Exercise as you're able, try to walk approximately 30 minutes per day.  Keep salt intake to a minimum, especially watch canned and prepared boxed foods.  Eat more fresh fruits and vegetables and fewer canned items.  Avoid eating in fast food restaurants.    HOW TO TAKE YOUR BLOOD PRESSURE: . Rest 5 minutes before taking your blood pressure. .  Don't smoke or drink caffeinated beverages for at least 30 minutes before. . Take your blood pressure before (not after) you eat. . Sit comfortably with your back supported and both feet on the floor (don't cross your legs). . Elevate your arm to heart level on a table or a desk. . Use the proper sized cuff. It should fit smoothly and snugly around your bare upper arm. There should be enough room to slip a fingertip under the cuff. The bottom edge of the cuff should be 1 inch above the crease of the elbow. . Ideally, take 3 measurements at one sitting and record the average.

## 2018-01-18 ENCOUNTER — Encounter: Payer: Self-pay | Admitting: Pharmacist Clinician (PhC)/ Clinical Pharmacy Specialist

## 2018-01-18 MED ORDER — AMLODIPINE BESYLATE 5 MG PO TABS: 5.0000 mg | ORAL_TABLET | Freq: Every day | ORAL | 5 refills | Status: DC

## 2018-01-18 NOTE — Assessment & Plan Note (Signed)
Patient with essential hypertension, followed by PCP in [place].  No concerns for stopping bisoprolol/hctz, although I doubt that this low dose is reason for his excessive daytime urination.  BP not at goal today, so will increase his amlodipine to 5 mg daily.  He has a follow up appointment with his PCP in about 3 weeks and I will defer further treatment to him.    We have no concerns about the patient being on sustained release metformin.

## 2018-01-20 ENCOUNTER — Encounter: Payer: Self-pay | Admitting: Cardiovascular Disease

## 2018-01-20 ENCOUNTER — Ambulatory Visit (INDEPENDENT_AMBULATORY_CARE_PROVIDER_SITE_OTHER): Payer: Medicare Other | Admitting: Cardiovascular Disease

## 2018-01-20 VITALS — BP 169/83 | HR 94 | Ht 65.0 in | Wt 176.0 lb

## 2018-01-20 DIAGNOSIS — I739 Peripheral vascular disease, unspecified: Secondary | ICD-10-CM

## 2018-01-20 DIAGNOSIS — I2510 Atherosclerotic heart disease of native coronary artery without angina pectoris: Secondary | ICD-10-CM

## 2018-01-20 NOTE — Assessment & Plan Note (Signed)
History of peripheral arterial disease status post left common iliac high-grade calcified stenosis diamondback orbital rotational atherectomy and VBX covered stenting.  His claudication has resolved.  His ABIs have completely normalized going from 0.51 on the left up to 0.89.  He remains on dual antiplatelet therapy.  We will continue lower extremity arterial Dopplers every 6 months and I will see him back in 1 year for follow-up.

## 2018-01-20 NOTE — Progress Notes (Signed)
01/20/2018 Lyndon Chapel   02-10-1951  [id number]  Primary Physician Celene Squibb, MD Primary Cardiologist: Lorretta Harp MD FACP, [place], [place], [place]  HPI:  Luke [name] is a 67 y.o.  thin appearing married Caucasian male father of 2, grandfather of 4 grandchildren who I last saw in the office 12/14/2017. Apparently I took care of her father 15 years ago, Luke [name].  He was referred by Beckie Busing, NP for peripheral vascular evaluation because of lifestyle limiting claudication.  His risk factors include greater than 100 pack years of tobacco abuse having quit 1 year ago, treated hypertension, diabetes and hyperlipidemia.  He is never had a heart attack or stroke.  He denies chest pain or shortness of breath.  He does have documented coronary disease by Dr. Claiborne Billings years ago but was never intervened on.  He is complained of lower extremity claudication for the last 4 years which has gone undiagnosed and treated as a "orthopedic issue".  Recent Dopplers performed in our office 12/12/2017 revealed a right ABI of 0.90 and left 0.51 with high-frequency signals in both iliac arteries.  I suspect this left common iliac is either occluded or or subtotally occluded.  He wishes to proceed with angiography and intervention.  I performed peripheral angiography on him 12/19/2017 revealing a 91% calcified ostial left common iliac artery stenosis and a 50% right.  I performed Dynabac orbital rotational atherectomy, PTA and covered stenting on him with an excellent angiographic and clinical result.  Follow-up Dopplers performed subsequent to that revealed increase in his left ABI from 0.51 up to 0.85.  His claudication has completely resolved.    Current Meds  Medication Sig  . amLODipine (NORVASC) 10 MG tablet Take 10 mg by mouth daily.  Marland Kitchen aspirin 81 MG tablet Take 1 tablet (81 mg total) by mouth daily. HOLD FOR 1 WEEK, THEN RESUME IF NO BLEEDING PROBLEMS  . atorvastatin (LIPITOR) 40 MG tablet Take 1 tablet  (40 mg total) by mouth daily.  . cetirizine (ZYRTEC) 10 MG tablet Take 10 mg by mouth daily.  . clopidogrel (PLAVIX) 75 MG tablet Take 1 tablet (75 mg total) by mouth daily with breakfast.  . empagliflozin (JARDIANCE) 25 MG TABS tablet Take 25 mg by mouth daily.   . ferrous sulfate 325 (65 FE) MG EC tablet Take 1 tablet (325 mg total) by mouth 2 (two) times daily after a meal. (Patient taking differently: Take 325 mg by mouth daily with breakfast. )  . finasteride (PROSCAR) 5 MG tablet Take 5 mg by mouth daily.  . Insulin Degludec-Liraglutide (XULTOPHY) 100-3.6 UNIT-MG/ML SOPN Inject 26 Units into the skin daily.  . metFORMIN (GLUMETZA) 1000 MG (MOD) 24 hr tablet Take 1,000 mg by mouth 2 (two) times daily.   . ramipril (ALTACE) 10 MG capsule TAKE 1 CAPSULE EVERY DAY  . tamsulosin (FLOMAX) 0.4 MG CAPS capsule Take 0.4 mg by mouth.  . [DISCONTINUED] amLODipine (NORVASC) 5 MG tablet Take 1 tablet (5 mg total) by mouth daily.  . [DISCONTINUED] bisoprolol-hydrochlorothiazide (ZIAC) 2.5-6.25 MG tablet Take 1 tablet by mouth daily.     Allergies  Allergen Reactions  . Morphine And Related Itching    Social History   Socioeconomic History  . Marital status: Married    Spouse name: Not on file  . Number of children: Not on file  . Years of education: Not on file  . Highest education level: Not on file  Occupational History  . Not on file  Social Needs  . Financial resource strain: Not on file  . Food insecurity:    Worry: Not on file    Inability: Not on file  . Transportation needs:    Medical: Not on file    Non-medical: Not on file  Tobacco Use  . Smoking status: Former Smoker    Packs/day: 2.00    Years: 50.00    Pack years: 100.00    Types: Cigarettes    Last attempt to quit: 09/28/2014    Years since quitting: 3.3  . Smokeless tobacco: Never Used  Substance and Sexual Activity  . Alcohol use: Never    Alcohol/week: 0.0 oz    Frequency: Never  . Drug use: Never  . Sexual  activity: Not Currently  Lifestyle  . Physical activity:    Days per week: Not on file    Minutes per session: Not on file  . Stress: Not on file  Relationships  . Social connections:    Talks on phone: Not on file    Gets together: Not on file    Attends religious service: Not on file    Active member of club or organization: Not on file    Attends meetings of clubs or organizations: Not on file    Relationship status: Not on file  . Intimate partner violence:    Fear of current or ex partner: Not on file    Emotionally abused: Not on file    Physically abused: Not on file    Forced sexual activity: Not on file  Other Topics Concern  . Not on file  Social History Narrative  . Not on file     Review of Systems: General: negative for chills, fever, night sweats or weight changes.  Cardiovascular: negative for chest pain, dyspnea on exertion, edema, orthopnea, palpitations, paroxysmal nocturnal dyspnea or shortness of breath Dermatological: negative for rash Respiratory: negative for cough or wheezing Urologic: negative for hematuria Abdominal: negative for nausea, vomiting, diarrhea, bright red blood per rectum, melena, or hematemesis Neurologic: negative for visual changes, syncope, or dizziness All other systems reviewed and are otherwise negative except as noted above.    Blood pressure (!) 169/83, pulse 94, height 5\' 5"  (1.651 m), weight 176 lb (79.8 kg).  General appearance: alert and no distress Neck: no adenopathy, no carotid bruit, no JVD, supple, symmetrical, trachea midline and thyroid not enlarged, symmetric, no tenderness/mass/nodules Lungs: clear to auscultation bilaterally Heart: regular rate and rhythm, S1, S2 normal, no murmur, click, rub or gallop Extremities: extremities normal, atraumatic, no cyanosis or edema Pulses: 2+ and symmetric Skin: Skin color, texture, turgor normal. No rashes or lesions Neurologic: Alert and oriented X 3, normal strength and  tone. Normal symmetric reflexes. Normal coordination and gait  EKG not performed today  ASSESSMENT AND PLAN:   Peripheral arterial disease ([place]) History of peripheral arterial disease status post left common iliac high-grade calcified stenosis diamondback orbital rotational atherectomy and VBX covered stenting.  His claudication has resolved.  His ABIs have completely normalized going from 0.51 on the left up to 0.89.  He remains on dual antiplatelet therapy.  We will continue lower extremity arterial Dopplers every 6 months and I will see him back in 1 year for follow-up.      Lorretta Harp MD FACP,FACC,FAHA, [facility] 01/20/2018 8:08 AM

## 2018-01-20 NOTE — Patient Instructions (Addendum)
Your physician wants you to follow-up in: [place] will receive a reminder letter in the mail two months in advance. If you don't receive a letter, please call our office to schedule the follow-up appointment.   Your physician has requested that you have a lower extremity arterial duplex. This test is an ultrasound of the arteries in the legs or arms. It looks at arterial blood flow in the legs and arms. Allow one hour for Lower and Upper Arterial scans. There are no restrictions or special instructions SCHEDULE IN 6 MONTHS

## 2018-02-01 DIAGNOSIS — E782 Mixed hyperlipidemia: Secondary | ICD-10-CM | POA: Diagnosis not present

## 2018-02-01 DIAGNOSIS — I10 Essential (primary) hypertension: Secondary | ICD-10-CM | POA: Diagnosis not present

## 2018-02-01 DIAGNOSIS — I2510 Atherosclerotic heart disease of native coronary artery without angina pectoris: Secondary | ICD-10-CM | POA: Diagnosis not present

## 2018-02-01 DIAGNOSIS — E1169 Type 2 diabetes mellitus with other specified complication: Secondary | ICD-10-CM | POA: Diagnosis not present

## 2018-02-21 ENCOUNTER — Other Ambulatory Visit: Payer: Self-pay | Admitting: Cardiovascular Disease

## 2018-03-03 ENCOUNTER — Other Ambulatory Visit: Payer: Self-pay

## 2018-03-03 MED ORDER — CLOPIDOGREL BISULFATE 75 MG PO TABS: 75.0000 mg | ORAL_TABLET | Freq: Every day | ORAL | 3 refills | Status: DC

## 2018-03-03 MED ORDER — AMLODIPINE BESYLATE 10 MG PO TABS: 10.0000 mg | ORAL_TABLET | Freq: Every day | ORAL | 3 refills | Status: DC

## 2018-03-08 DIAGNOSIS — Z6828 Body mass index (BMI) 28.0-28.9, adult: Secondary | ICD-10-CM | POA: Diagnosis not present

## 2018-03-08 DIAGNOSIS — Z6829 Body mass index (BMI) 29.0-29.9, adult: Secondary | ICD-10-CM | POA: Diagnosis not present

## 2018-03-08 DIAGNOSIS — R05 Cough: Secondary | ICD-10-CM | POA: Diagnosis not present

## 2018-03-08 DIAGNOSIS — E782 Mixed hyperlipidemia: Secondary | ICD-10-CM | POA: Diagnosis not present

## 2018-03-08 DIAGNOSIS — I10 Essential (primary) hypertension: Secondary | ICD-10-CM | POA: Diagnosis not present

## 2018-03-08 DIAGNOSIS — D509 Iron deficiency anemia, unspecified: Secondary | ICD-10-CM | POA: Diagnosis not present

## 2018-03-08 DIAGNOSIS — E1169 Type 2 diabetes mellitus with other specified complication: Secondary | ICD-10-CM | POA: Diagnosis not present

## 2018-03-08 DIAGNOSIS — Z96653 Presence of artificial knee joint, bilateral: Secondary | ICD-10-CM | POA: Diagnosis not present

## 2018-03-08 DIAGNOSIS — D72829 Elevated white blood cell count, unspecified: Secondary | ICD-10-CM | POA: Diagnosis not present

## 2018-03-08 DIAGNOSIS — E1165 Type 2 diabetes mellitus with hyperglycemia: Secondary | ICD-10-CM | POA: Diagnosis not present

## 2018-03-08 DIAGNOSIS — I2510 Atherosclerotic heart disease of native coronary artery without angina pectoris: Secondary | ICD-10-CM | POA: Diagnosis not present

## 2018-03-08 DIAGNOSIS — N400 Enlarged prostate without lower urinary tract symptoms: Secondary | ICD-10-CM | POA: Diagnosis not present

## 2018-03-15 DIAGNOSIS — N400 Enlarged prostate without lower urinary tract symptoms: Secondary | ICD-10-CM | POA: Diagnosis not present

## 2018-03-15 DIAGNOSIS — D509 Iron deficiency anemia, unspecified: Secondary | ICD-10-CM | POA: Diagnosis not present

## 2018-03-15 DIAGNOSIS — I2510 Atherosclerotic heart disease of native coronary artery without angina pectoris: Secondary | ICD-10-CM | POA: Diagnosis not present

## 2018-03-15 DIAGNOSIS — E782 Mixed hyperlipidemia: Secondary | ICD-10-CM | POA: Diagnosis not present

## 2018-03-15 DIAGNOSIS — Z96653 Presence of artificial knee joint, bilateral: Secondary | ICD-10-CM | POA: Diagnosis not present

## 2018-03-15 DIAGNOSIS — K120 Recurrent oral aphthae: Secondary | ICD-10-CM | POA: Diagnosis not present

## 2018-03-15 DIAGNOSIS — Z6829 Body mass index (BMI) 29.0-29.9, adult: Secondary | ICD-10-CM | POA: Diagnosis not present

## 2018-03-15 DIAGNOSIS — E1165 Type 2 diabetes mellitus with hyperglycemia: Secondary | ICD-10-CM | POA: Diagnosis not present

## 2018-03-15 DIAGNOSIS — Z720 Tobacco use: Secondary | ICD-10-CM | POA: Diagnosis not present

## 2018-03-15 DIAGNOSIS — I10 Essential (primary) hypertension: Secondary | ICD-10-CM | POA: Diagnosis not present

## 2018-03-16 DIAGNOSIS — I2510 Atherosclerotic heart disease of native coronary artery without angina pectoris: Secondary | ICD-10-CM | POA: Diagnosis not present

## 2018-03-16 DIAGNOSIS — D509 Iron deficiency anemia, unspecified: Secondary | ICD-10-CM | POA: Diagnosis not present

## 2018-03-16 DIAGNOSIS — E1169 Type 2 diabetes mellitus with other specified complication: Secondary | ICD-10-CM | POA: Diagnosis not present

## 2018-03-16 DIAGNOSIS — I10 Essential (primary) hypertension: Secondary | ICD-10-CM | POA: Diagnosis not present

## 2018-03-16 DIAGNOSIS — E782 Mixed hyperlipidemia: Secondary | ICD-10-CM | POA: Diagnosis not present

## 2018-03-30 DIAGNOSIS — Z23 Encounter for immunization: Secondary | ICD-10-CM | POA: Diagnosis not present

## 2018-04-03 DIAGNOSIS — L851 Acquired keratosis [keratoderma] palmaris et plantaris: Secondary | ICD-10-CM | POA: Diagnosis not present

## 2018-04-03 DIAGNOSIS — E1151 Type 2 diabetes mellitus with diabetic peripheral angiopathy without gangrene: Secondary | ICD-10-CM | POA: Diagnosis not present

## 2018-04-03 DIAGNOSIS — M79675 Pain in left toe(s): Secondary | ICD-10-CM | POA: Diagnosis not present

## 2018-04-03 DIAGNOSIS — M79674 Pain in right toe(s): Secondary | ICD-10-CM | POA: Diagnosis not present

## 2018-04-03 DIAGNOSIS — B351 Tinea unguium: Secondary | ICD-10-CM | POA: Diagnosis not present

## 2018-05-03 ENCOUNTER — Other Ambulatory Visit: Payer: Self-pay | Admitting: Cardiovascular Disease

## 2018-05-22 ENCOUNTER — Other Ambulatory Visit (INDEPENDENT_AMBULATORY_CARE_PROVIDER_SITE_OTHER): Payer: Self-pay | Admitting: Internal Medicine

## 2018-05-22 ENCOUNTER — Other Ambulatory Visit: Payer: Self-pay | Admitting: Cardiovascular Disease

## 2018-05-24 DIAGNOSIS — E1169 Type 2 diabetes mellitus with other specified complication: Secondary | ICD-10-CM | POA: Diagnosis not present

## 2018-05-24 DIAGNOSIS — N400 Enlarged prostate without lower urinary tract symptoms: Secondary | ICD-10-CM | POA: Diagnosis not present

## 2018-05-24 DIAGNOSIS — E1165 Type 2 diabetes mellitus with hyperglycemia: Secondary | ICD-10-CM | POA: Diagnosis not present

## 2018-05-24 DIAGNOSIS — I10 Essential (primary) hypertension: Secondary | ICD-10-CM | POA: Diagnosis not present

## 2018-05-24 DIAGNOSIS — E782 Mixed hyperlipidemia: Secondary | ICD-10-CM | POA: Diagnosis not present

## 2018-05-24 DIAGNOSIS — E119 Type 2 diabetes mellitus without complications: Secondary | ICD-10-CM | POA: Diagnosis not present

## 2018-05-24 DIAGNOSIS — I2510 Atherosclerotic heart disease of native coronary artery without angina pectoris: Secondary | ICD-10-CM | POA: Diagnosis not present

## 2018-06-07 DIAGNOSIS — M545 Low back pain: Secondary | ICD-10-CM | POA: Diagnosis not present

## 2018-06-09 DIAGNOSIS — D2261 Melanocytic nevi of right upper limb, including shoulder: Secondary | ICD-10-CM | POA: Diagnosis not present

## 2018-06-09 DIAGNOSIS — D225 Melanocytic nevi of trunk: Secondary | ICD-10-CM | POA: Diagnosis not present

## 2018-06-09 DIAGNOSIS — L400 Psoriasis vulgaris: Secondary | ICD-10-CM | POA: Diagnosis not present

## 2018-06-09 DIAGNOSIS — D1801 Hemangioma of skin and subcutaneous tissue: Secondary | ICD-10-CM | POA: Diagnosis not present

## 2018-06-09 DIAGNOSIS — D2271 Melanocytic nevi of right lower limb, including hip: Secondary | ICD-10-CM | POA: Diagnosis not present

## 2018-06-12 DIAGNOSIS — E1165 Type 2 diabetes mellitus with hyperglycemia: Secondary | ICD-10-CM | POA: Diagnosis not present

## 2018-06-12 DIAGNOSIS — I10 Essential (primary) hypertension: Secondary | ICD-10-CM | POA: Diagnosis not present

## 2018-06-12 DIAGNOSIS — I2510 Atherosclerotic heart disease of native coronary artery without angina pectoris: Secondary | ICD-10-CM | POA: Diagnosis not present

## 2018-06-12 DIAGNOSIS — E782 Mixed hyperlipidemia: Secondary | ICD-10-CM | POA: Diagnosis not present

## 2018-06-12 DIAGNOSIS — N400 Enlarged prostate without lower urinary tract symptoms: Secondary | ICD-10-CM | POA: Diagnosis not present

## 2018-06-12 DIAGNOSIS — Z6828 Body mass index (BMI) 28.0-28.9, adult: Secondary | ICD-10-CM | POA: Diagnosis not present

## 2018-06-12 DIAGNOSIS — D72829 Elevated white blood cell count, unspecified: Secondary | ICD-10-CM | POA: Diagnosis not present

## 2018-06-12 DIAGNOSIS — D509 Iron deficiency anemia, unspecified: Secondary | ICD-10-CM | POA: Diagnosis not present

## 2018-06-12 DIAGNOSIS — E1169 Type 2 diabetes mellitus with other specified complication: Secondary | ICD-10-CM | POA: Diagnosis not present

## 2018-06-12 DIAGNOSIS — Z96653 Presence of artificial knee joint, bilateral: Secondary | ICD-10-CM | POA: Diagnosis not present

## 2018-06-12 DIAGNOSIS — R05 Cough: Secondary | ICD-10-CM | POA: Diagnosis not present

## 2018-06-12 DIAGNOSIS — Z6829 Body mass index (BMI) 29.0-29.9, adult: Secondary | ICD-10-CM | POA: Diagnosis not present

## 2018-06-16 DIAGNOSIS — E119 Type 2 diabetes mellitus without complications: Secondary | ICD-10-CM | POA: Diagnosis not present

## 2018-06-16 DIAGNOSIS — E782 Mixed hyperlipidemia: Secondary | ICD-10-CM | POA: Diagnosis not present

## 2018-06-16 DIAGNOSIS — I739 Peripheral vascular disease, unspecified: Secondary | ICD-10-CM | POA: Diagnosis not present

## 2018-06-16 DIAGNOSIS — Z720 Tobacco use: Secondary | ICD-10-CM | POA: Diagnosis not present

## 2018-06-16 DIAGNOSIS — N400 Enlarged prostate without lower urinary tract symptoms: Secondary | ICD-10-CM | POA: Diagnosis not present

## 2018-06-16 DIAGNOSIS — L409 Psoriasis, unspecified: Secondary | ICD-10-CM | POA: Diagnosis not present

## 2018-06-16 DIAGNOSIS — Z96651 Presence of right artificial knee joint: Secondary | ICD-10-CM | POA: Diagnosis not present

## 2018-06-16 DIAGNOSIS — I10 Essential (primary) hypertension: Secondary | ICD-10-CM | POA: Diagnosis not present

## 2018-06-19 DIAGNOSIS — B351 Tinea unguium: Secondary | ICD-10-CM | POA: Diagnosis not present

## 2018-06-19 DIAGNOSIS — M79675 Pain in left toe(s): Secondary | ICD-10-CM | POA: Diagnosis not present

## 2018-06-19 DIAGNOSIS — M79674 Pain in right toe(s): Secondary | ICD-10-CM | POA: Diagnosis not present

## 2018-06-19 DIAGNOSIS — M7740 Metatarsalgia, unspecified foot: Secondary | ICD-10-CM | POA: Diagnosis not present

## 2018-06-19 DIAGNOSIS — L851 Acquired keratosis [keratoderma] palmaris et plantaris: Secondary | ICD-10-CM | POA: Diagnosis not present

## 2018-06-19 DIAGNOSIS — E1151 Type 2 diabetes mellitus with diabetic peripheral angiopathy without gangrene: Secondary | ICD-10-CM | POA: Diagnosis not present

## 2018-06-26 ENCOUNTER — Encounter: Payer: Self-pay | Admitting: Internal Medicine

## 2018-06-26 DIAGNOSIS — E1165 Type 2 diabetes mellitus with hyperglycemia: Secondary | ICD-10-CM | POA: Diagnosis not present

## 2018-06-26 DIAGNOSIS — N400 Enlarged prostate without lower urinary tract symptoms: Secondary | ICD-10-CM | POA: Diagnosis not present

## 2018-06-26 DIAGNOSIS — Z96653 Presence of artificial knee joint, bilateral: Secondary | ICD-10-CM | POA: Diagnosis not present

## 2018-06-26 DIAGNOSIS — D509 Iron deficiency anemia, unspecified: Secondary | ICD-10-CM | POA: Diagnosis not present

## 2018-06-26 DIAGNOSIS — Z6828 Body mass index (BMI) 28.0-28.9, adult: Secondary | ICD-10-CM | POA: Diagnosis not present

## 2018-06-26 DIAGNOSIS — R05 Cough: Secondary | ICD-10-CM | POA: Diagnosis not present

## 2018-06-26 DIAGNOSIS — I10 Essential (primary) hypertension: Secondary | ICD-10-CM | POA: Diagnosis not present

## 2018-06-26 DIAGNOSIS — E782 Mixed hyperlipidemia: Secondary | ICD-10-CM | POA: Diagnosis not present

## 2018-06-26 DIAGNOSIS — I2510 Atherosclerotic heart disease of native coronary artery without angina pectoris: Secondary | ICD-10-CM | POA: Diagnosis not present

## 2018-06-26 DIAGNOSIS — D72829 Elevated white blood cell count, unspecified: Secondary | ICD-10-CM | POA: Diagnosis not present

## 2018-06-26 DIAGNOSIS — E1169 Type 2 diabetes mellitus with other specified complication: Secondary | ICD-10-CM | POA: Diagnosis not present

## 2018-06-26 DIAGNOSIS — Z6829 Body mass index (BMI) 29.0-29.9, adult: Secondary | ICD-10-CM | POA: Diagnosis not present

## 2018-06-27 ENCOUNTER — Encounter (INDEPENDENT_AMBULATORY_CARE_PROVIDER_SITE_OTHER): Payer: Self-pay | Admitting: Internal Medicine

## 2018-06-27 ENCOUNTER — Telehealth (INDEPENDENT_AMBULATORY_CARE_PROVIDER_SITE_OTHER): Payer: Self-pay | Admitting: Internal Medicine

## 2018-06-27 ENCOUNTER — Ambulatory Visit (INDEPENDENT_AMBULATORY_CARE_PROVIDER_SITE_OTHER): Payer: Medicare Other | Admitting: Internal Medicine

## 2018-06-27 VITALS — BP 160/64 | HR 72 | Temp 97.5°F | Ht 66.0 in | Wt 173.7 lb

## 2018-06-27 DIAGNOSIS — D500 Iron deficiency anemia secondary to blood loss (chronic): Secondary | ICD-10-CM | POA: Diagnosis not present

## 2018-06-27 NOTE — Patient Instructions (Addendum)
CBC Start taking the Iron pill

## 2018-06-27 NOTE — Progress Notes (Signed)
Subjective:    Patient ID: Luke [name], male    DOB: Nov 06, 1950, 68 y.o.   MRN: [id number]  HPI Referred by Dr. [name] for drop in Hemoglobin.  06/26/2018 Hemoglobin 7.7. Hematocrit 27.8, MCV 70 TIBC 364, Iron 351, Iron sat 13, Ferritin 6.  06/13/2018 Hemoglobin 9.2.g He says he is off iron pills. He say his stools are brown. Denies seeing any BRRB. Appetite is okay. No weight loss. BM x 3-4 a day.    Hx of UGIB. Admitted to [facility] 2016 with Hemoglobin 5.5. Hx of melena.  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 Benson Norway.  Small Bowel Given's Capsule was negative in April of 2016.    5/9/2016Colonoscopy  Indications:Patient is 68 year old Caucasian male with history of iron deficiency anemia with workup in 2010. He was seen by me in April 2015 and his hemoglobin was 14 g. Stool was guaiac negative. By mouth iron was discontinued. He now presents with few weeks history of melena and noted to have hemoglobin of 5.5 g. 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 Carol Ada. These lesions are felt not to be the source of bleeding.  Impression:  Normal mucosa of terminal ileum. Focal edema/thickening to mucosal fold at sigmoid colon. Biopsy taken.  Underwent a femoral stenting in July and is maintained on Plavix and ASA.  Appetite good. No weight loss. Has been eating ice per wife. Stools have been brown. Has been off Iron since the femoral stenting.    07/08/2017 Laparoscopic cholecystectomy.   Review of Systems Past Medical History:  Diagnosis Date  . CAD (coronary artery disease) 10/02/2010   nuclear study show normal perfusion on medical therapy without scar or ischemia. EF 64%  . GERD (gastroesophageal reflux disease)   . Heart murmur    "mild one" (12/19/2017)  . Hematochezia 11/19/2009  . History of blood transfusion    "low HgB"  (12/19/2017)  . Hyperlipidemia   . Hypertension   . Iron deficiency anemia 11/19/2009  . Osteoarthritis    "was in my knees" (12/19/2017)  . PAD (peripheral artery disease) ([place])   . 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 Sleep apnea    "trial mask in the 1990s; haven't used one since" (12/19/2017)  . Type II diabetes mellitus ([place])     Past Surgical History:  Procedure Laterality Date  . ABDOMINAL AORTOGRAM W/LOWER EXTREMITY N/A 12/19/2017   Procedure: ABDOMINAL AORTOGRAM W/LOWER EXTREMITY;  Surgeon: Lorretta Harp, MD;  Location: [place] [facility];  Service: Cardiovascular;  Laterality: N/A;  . CARDIAC CATHETERIZATION  08/1999   which revealed a mild coronary artery disease with 60 and 70 and 80% stenosis in a small first diagonal vessel at the LAD, 50% mid LAD stenosis, 50% ostial left circumflrx stenosis, and 40% ostial and proximal intermediate stenosis. he had 10 to 20% irregularities of his mid right coronary artery.  . CHOLECYSTECTOMY N/A 07/08/2017   Procedure: LAPAROSCOPIC CHOLECYSTECTOMY;  Surgeon: Aviva Signs, MD;  [facility]: [facility];  Service: General;  Laterality: N/A;  . CIRCUMCISION  07/17/2012   Procedure: CIRCUMCISION ADULT;  Surgeon: Dutch Gray, MD;  Location: [facility];  Service: Urology;  Laterality: N/A;  . COLONOSCOPY  02/14/06   M JENKINS  . COLONOSCOPY N/A 10/28/2014   Procedure: COLONOSCOPY;  Surgeon: Rogene Houston, MD;  [facility];  Service: Endoscopy;  Laterality: N/A;  730  . ENTEROSCOPY N/A 10/04/2014   Procedure: ENTEROSCOPY;  Surgeon: Carol Ada, MD;  Location: [place];  Service: Endoscopy;  Laterality: N/A;  . ERCP  12/25/1996   ROURK  . GIVENS CAPSULE STUDY N/A 10/04/2014   Procedure: GIVENS CAPSULE STUDY;  Surgeon: Carol Ada, MD;  Location: [place];  Service: Endoscopy;  Laterality: N/A;  . JOINT REPLACEMENT    . PERIPHERAL VASCULAR ATHERECTOMY Left 12/19/2017   Procedure: PERIPHERAL VASCULAR ATHERECTOMY;  Surgeon: Lorretta Harp,  MD;  Location: [place] [facility];  Service: Cardiovascular;  Laterality: Left;  . PERIPHERAL VASCULAR INTERVENTION Left 12/19/2017   Procedure: PERIPHERAL VASCULAR INTERVENTION;  Surgeon: Lorretta Harp, MD;  Location: [place] [facility];  Service: Cardiovascular;  Laterality: Left;  stent  . SMALL BOWEL GIVENS  11/25/2008  . TOTAL KNEE ARTHROPLASTY  2007-2008  . UPPER GASTROINTESTINAL ENDOSCOPY  11/08/2008   EGD TCS    Allergies  Allergen Reactions  . Morphine And Related Itching    Current Outpatient Medications on File Prior to Visit  Medication Sig Dispense Refill  . amLODipine (NORVASC) 10 MG tablet Take 1 tablet (10 mg total) by mouth daily. 90 tablet 3  . aspirin 81 MG tablet Take 1 tablet (81 mg total) by mouth daily. HOLD FOR 1 WEEK, THEN RESUME IF NO BLEEDING PROBLEMS 30 tablet   . atorvastatin (LIPITOR) 40 MG tablet TAKE 1 TABLET EVERY DAY 90 tablet 3  . cetirizine (ZYRTEC) 10 MG tablet Take 10 mg by mouth daily.    . clopidogrel (PLAVIX) 75 MG tablet Take 1 tablet (75 mg total) by mouth daily with breakfast. 90 tablet 3  . empagliflozin (JARDIANCE) 25 MG TABS tablet Take 25 mg by mouth daily.     . ferrous sulfate 325 (65 FE) MG EC tablet Take 1 tablet (325 mg total) by mouth 2 (two) times daily after a meal. (Patient taking differently: Take 325 mg by mouth daily with breakfast. )  3  . finasteride (PROSCAR) 5 MG tablet Take 5 mg by mouth daily.    Marland Kitchen gabapentin (NEURONTIN) 300 MG capsule Take 300 mg by mouth at bedtime.    . Insulin Degludec-Liraglutide (XULTOPHY) 100-3.6 UNIT-MG/ML SOPN Inject 26 Units into the skin daily.    . metFORMIN (GLUMETZA) 1000 MG (MOD) 24 hr tablet Take 1,000 mg by mouth 2 (two) times daily.     . ramipril (ALTACE) 10 MG capsule TAKE 1 CAPSULE EVERY DAY 90 capsule 2  . tamsulosin (FLOMAX) 0.4 MG CAPS capsule Take 0.4 mg by mouth.     No current facility-administered medications on file prior to visit.         Objective:   Physical Exam  Blood pressure (!) 160/64, pulse 72, temperature (!) 97.5 F (36.4 C), height 5\' 6"  (1.676 m), weight 173 lb 11.2 oz (78.8 kg). Alert and oriented. Skin warm and dry. Oral mucosa is moist.   . Sclera anicteric, conjunctivae is pink. Thyroid not enlarged. No cervical lymphadenopathy. Lungs clear. Heart regular rate and rhythm.  Abdomen is soft. Bowel sounds are positive. No hepatomegaly. No abdominal masses felt. No tenderness.  No edema to lower extremities.           Assessment & Plan:  IDA. Stool was negative in office today. Hx of UGI bleed. Three stool cards home with patient.  Instructed patient if he becomes weak and passes dark stools, to go to [facility]. (Possible small bowel enteroscopy). He will be scheduled for Iron transfusion  and message has been sent to [facility] LPN. Further recommendations to follow.

## 2018-06-27 NOTE — Telephone Encounter (Signed)
Nees Iron transfusion. Hemoglobin 7.7

## 2018-06-28 ENCOUNTER — Telehealth (INDEPENDENT_AMBULATORY_CARE_PROVIDER_SITE_OTHER): Payer: Self-pay | Admitting: Internal Medicine

## 2018-06-28 NOTE — Telephone Encounter (Signed)
Per Karna Christmas - request Feraheme IV x 2.  A PA will be done on 06/29/2018.

## 2018-06-28 NOTE — Telephone Encounter (Signed)
He was advised that we are working on getting this set up.

## 2018-06-28 NOTE — Telephone Encounter (Signed)
Patient called stated he was to be set up for a procedure but hasn't heard anything - please call [phone]

## 2018-06-29 NOTE — Telephone Encounter (Signed)
PA has been completed , orders have been sent to the [facility]. They will contact the patient of the date and time.

## 2018-06-30 DIAGNOSIS — Z720 Tobacco use: Secondary | ICD-10-CM | POA: Diagnosis not present

## 2018-06-30 DIAGNOSIS — D649 Anemia, unspecified: Secondary | ICD-10-CM | POA: Diagnosis not present

## 2018-06-30 DIAGNOSIS — I10 Essential (primary) hypertension: Secondary | ICD-10-CM | POA: Diagnosis not present

## 2018-06-30 DIAGNOSIS — I739 Peripheral vascular disease, unspecified: Secondary | ICD-10-CM | POA: Diagnosis not present

## 2018-07-04 ENCOUNTER — Encounter (HOSPITAL_COMMUNITY)
Admission: RE | Admit: 2018-07-04 | Discharge: 2018-07-04 | Disposition: A | Discharge status: Home / Self Care | Payer: Medicare Other | Source: Ambulatory Visit | Attending: Internal Medicine | Admitting: Internal Medicine

## 2018-07-04 ENCOUNTER — Encounter (HOSPITAL_COMMUNITY): Payer: Self-pay

## 2018-07-04 DIAGNOSIS — D500 Iron deficiency anemia secondary to blood loss (chronic): Secondary | ICD-10-CM | POA: Insufficient documentation

## 2018-07-04 MED ORDER — SODIUM CHLORIDE 0.9 % IV SOLN
Freq: Once | INTRAVENOUS | Status: AC
  Administered 2018-07-04: 14:00:00 via INTRAVENOUS

## 2018-07-04 MED ORDER — SODIUM CHLORIDE 0.9 % IV SOLN
510.0000 mg | Freq: Once | INTRAVENOUS | Status: AC
  Administered 2018-07-04: 510 mg via INTRAVENOUS
  Filled 2018-07-04: qty 17

## 2018-07-06 ENCOUNTER — Encounter: Payer: Self-pay | Admitting: Internal Medicine

## 2018-07-06 DIAGNOSIS — E1165 Type 2 diabetes mellitus with hyperglycemia: Secondary | ICD-10-CM | POA: Diagnosis not present

## 2018-07-06 DIAGNOSIS — E1169 Type 2 diabetes mellitus with other specified complication: Secondary | ICD-10-CM | POA: Diagnosis not present

## 2018-07-06 DIAGNOSIS — D72829 Elevated white blood cell count, unspecified: Secondary | ICD-10-CM | POA: Diagnosis not present

## 2018-07-11 ENCOUNTER — Encounter (HOSPITAL_COMMUNITY): Admission: RE | Admit: 2018-07-11 | Payer: Medicare Other | Source: Ambulatory Visit

## 2018-07-11 ENCOUNTER — Encounter (HOSPITAL_COMMUNITY)
Admission: RE | Admit: 2018-07-11 | Discharge: 2018-07-11 | Disposition: A | Discharge status: Home / Self Care | Payer: Medicare Other | Source: Ambulatory Visit | Attending: Internal Medicine | Admitting: Internal Medicine

## 2018-07-11 DIAGNOSIS — D500 Iron deficiency anemia secondary to blood loss (chronic): Secondary | ICD-10-CM | POA: Diagnosis not present

## 2018-07-11 LAB — CBC
HCT: 36.2 % — ABNORMAL LOW (ref 39.0–52.0)
Hemoglobin: 9.3 g/dL — ABNORMAL LOW (ref 13.0–17.0)
MCH: 20.3 pg — ABNORMAL LOW (ref 26.0–34.0)
MCHC: 25.7 g/dL — ABNORMAL LOW (ref 30.0–36.0)
MCV: 79 fL — AB (ref 80.0–100.0)
PLATELETS: 340 10*3/uL (ref 150–400)
RBC: 4.58 MIL/uL (ref 4.22–5.81)
RDW: 27 % — ABNORMAL HIGH (ref 11.5–15.5)
WBC: 7.5 10*3/uL (ref 4.0–10.5)
nRBC: 0 % (ref 0.0–0.2)

## 2018-07-11 NOTE — Progress Notes (Signed)
HGB 9.3 today.  Dr. Laural Golden made aware.  Type and Screen/ Crossmatch discontinued and patient credited test, per [place] in lab. Per Dr. Laural Golden, will continue with second dose of Feraheme on Thursday.  Patient notified and verbalized understanding.

## 2018-07-13 ENCOUNTER — Ambulatory Visit (HOSPITAL_COMMUNITY)
Admission: RE | Admit: 2018-07-13 | Discharge: 2018-07-13 | Disposition: A | Discharge status: Home / Self Care | Payer: Medicare Other | Source: Ambulatory Visit | Attending: Internal Medicine | Admitting: Internal Medicine

## 2018-07-13 ENCOUNTER — Inpatient Hospital Stay (HOSPITAL_COMMUNITY): Admission: RE | Admit: 2018-07-13 | Payer: Medicare Other | Source: Ambulatory Visit

## 2018-07-13 ENCOUNTER — Encounter (HOSPITAL_COMMUNITY): Payer: Self-pay

## 2018-07-13 DIAGNOSIS — D500 Iron deficiency anemia secondary to blood loss (chronic): Secondary | ICD-10-CM | POA: Insufficient documentation

## 2018-07-13 MED ORDER — SODIUM CHLORIDE 0.9 % IV SOLN
Freq: Once | INTRAVENOUS | Status: AC
  Administered 2018-07-13: 250 mL via INTRAVENOUS

## 2018-07-13 MED ORDER — SODIUM CHLORIDE 0.9 % IV SOLN
510.0000 mg | Freq: Once | INTRAVENOUS | Status: AC
  Administered 2018-07-13: 510 mg via INTRAVENOUS
  Filled 2018-07-13: qty 17

## 2018-07-13 NOTE — Discharge Instructions (Signed)

## 2018-07-17 ENCOUNTER — Encounter (INDEPENDENT_AMBULATORY_CARE_PROVIDER_SITE_OTHER): Payer: Self-pay | Admitting: *Deleted

## 2018-07-17 ENCOUNTER — Other Ambulatory Visit (INDEPENDENT_AMBULATORY_CARE_PROVIDER_SITE_OTHER): Payer: Self-pay | Admitting: *Deleted

## 2018-07-17 DIAGNOSIS — D509 Iron deficiency anemia, unspecified: Secondary | ICD-10-CM

## 2018-07-19 DIAGNOSIS — N401 Enlarged prostate with lower urinary tract symptoms: Secondary | ICD-10-CM | POA: Diagnosis not present

## 2018-07-24 DIAGNOSIS — D509 Iron deficiency anemia, unspecified: Secondary | ICD-10-CM | POA: Diagnosis not present

## 2018-07-24 DIAGNOSIS — E1165 Type 2 diabetes mellitus with hyperglycemia: Secondary | ICD-10-CM | POA: Diagnosis not present

## 2018-07-24 DIAGNOSIS — I10 Essential (primary) hypertension: Secondary | ICD-10-CM | POA: Diagnosis not present

## 2018-07-24 DIAGNOSIS — E1169 Type 2 diabetes mellitus with other specified complication: Secondary | ICD-10-CM | POA: Diagnosis not present

## 2018-07-24 DIAGNOSIS — D649 Anemia, unspecified: Secondary | ICD-10-CM | POA: Diagnosis not present

## 2018-07-24 LAB — HEMOGLOBIN AND HEMATOCRIT, BLOOD
HCT: 38.1 % — ABNORMAL LOW (ref 38.5–50.0)
Hemoglobin: 11.2 g/dL — ABNORMAL LOW (ref 13.2–17.1)

## 2018-07-26 ENCOUNTER — Other Ambulatory Visit (INDEPENDENT_AMBULATORY_CARE_PROVIDER_SITE_OTHER): Payer: Self-pay | Admitting: *Deleted

## 2018-07-26 DIAGNOSIS — D509 Iron deficiency anemia, unspecified: Secondary | ICD-10-CM

## 2018-07-26 DIAGNOSIS — N401 Enlarged prostate with lower urinary tract symptoms: Secondary | ICD-10-CM | POA: Diagnosis not present

## 2018-07-26 DIAGNOSIS — R3912 Poor urinary stream: Secondary | ICD-10-CM | POA: Diagnosis not present

## 2018-07-28 ENCOUNTER — Other Ambulatory Visit: Payer: Self-pay | Admitting: Cardiovascular Disease

## 2018-07-28 ENCOUNTER — Ambulatory Visit (HOSPITAL_BASED_OUTPATIENT_CLINIC_OR_DEPARTMENT_OTHER)
Admission: RE | Admit: 2018-07-28 | Discharge: 2018-07-28 | Disposition: A | Discharge status: Home / Self Care | Payer: Medicare Other | Source: Ambulatory Visit | Attending: Cardiovascular Disease | Admitting: Cardiovascular Disease

## 2018-07-28 ENCOUNTER — Ambulatory Visit (HOSPITAL_COMMUNITY)
Admission: RE | Admit: 2018-07-28 | Discharge: 2018-07-28 | Disposition: A | Discharge status: Home / Self Care | Payer: Medicare Other | Source: Ambulatory Visit | Attending: Cardiovascular Disease | Admitting: Cardiovascular Disease

## 2018-07-28 DIAGNOSIS — Z95820 Peripheral vascular angioplasty status with implants and grafts: Secondary | ICD-10-CM | POA: Diagnosis not present

## 2018-07-28 DIAGNOSIS — I739 Peripheral vascular disease, unspecified: Secondary | ICD-10-CM

## 2018-08-02 ENCOUNTER — Other Ambulatory Visit: Payer: Self-pay | Admitting: *Deleted

## 2018-08-02 DIAGNOSIS — I739 Peripheral vascular disease, unspecified: Secondary | ICD-10-CM

## 2018-08-04 ENCOUNTER — Encounter (HOSPITAL_COMMUNITY): Payer: Self-pay | Admitting: *Deleted

## 2018-08-04 ENCOUNTER — Encounter (HOSPITAL_COMMUNITY)
Admission: RE | Disposition: A | Discharge status: Home / Self Care | Payer: Self-pay | Source: Home / Self Care | Attending: Internal Medicine

## 2018-08-04 ENCOUNTER — Ambulatory Visit (HOSPITAL_COMMUNITY)
Admission: RE | Admit: 2018-08-04 | Discharge: 2018-08-04 | Disposition: A | Discharge status: Home / Self Care | Payer: Medicare Other | Attending: Internal Medicine | Admitting: Internal Medicine

## 2018-08-04 DIAGNOSIS — I2510 Atherosclerotic heart disease of native coronary artery without angina pectoris: Secondary | ICD-10-CM | POA: Diagnosis not present

## 2018-08-04 DIAGNOSIS — E785 Hyperlipidemia, unspecified: Secondary | ICD-10-CM | POA: Insufficient documentation

## 2018-08-04 DIAGNOSIS — G4730 Sleep apnea, unspecified: Secondary | ICD-10-CM | POA: Insufficient documentation

## 2018-08-04 DIAGNOSIS — Z87891 Personal history of nicotine dependence: Secondary | ICD-10-CM | POA: Insufficient documentation

## 2018-08-04 DIAGNOSIS — Z7902 Long term (current) use of antithrombotics/antiplatelets: Secondary | ICD-10-CM | POA: Diagnosis not present

## 2018-08-04 DIAGNOSIS — Z79899 Other long term (current) drug therapy: Secondary | ICD-10-CM | POA: Diagnosis not present

## 2018-08-04 DIAGNOSIS — D500 Iron deficiency anemia secondary to blood loss (chronic): Secondary | ICD-10-CM | POA: Insufficient documentation

## 2018-08-04 DIAGNOSIS — Z96659 Presence of unspecified artificial knee joint: Secondary | ICD-10-CM | POA: Diagnosis not present

## 2018-08-04 DIAGNOSIS — Z7982 Long term (current) use of aspirin: Secondary | ICD-10-CM | POA: Insufficient documentation

## 2018-08-04 DIAGNOSIS — E1151 Type 2 diabetes mellitus with diabetic peripheral angiopathy without gangrene: Secondary | ICD-10-CM | POA: Diagnosis not present

## 2018-08-04 DIAGNOSIS — K5520 Angiodysplasia of colon without hemorrhage: Secondary | ICD-10-CM | POA: Diagnosis not present

## 2018-08-04 DIAGNOSIS — Z794 Long term (current) use of insulin: Secondary | ICD-10-CM | POA: Diagnosis not present

## 2018-08-04 DIAGNOSIS — I10 Essential (primary) hypertension: Secondary | ICD-10-CM | POA: Insufficient documentation

## 2018-08-04 DIAGNOSIS — D509 Iron deficiency anemia, unspecified: Secondary | ICD-10-CM | POA: Insufficient documentation

## 2018-08-04 HISTORY — PX: GIVENS CAPSULE STUDY: SHX5432

## 2018-08-04 SURGERY — IMAGING PROCEDURE, GI TRACT, INTRALUMINAL, VIA CAPSULE

## 2018-08-04 NOTE — H&P (Signed)
Luke [name] is an 68 y.o. male.   Chief Complaint: Patient is here for small bowel given capsule study HPI: Patient is 68 year old Caucasian male who presented with GI bleeding hemoglobin of less than 8 g.  He has responded to iron infusion.  He has a history of GI bleed from small bowel few years ago.  He was last evaluated and 2016.  He had couple of occasional AV malformations ablated.  It is suspected that he may be losing blood again from the small bowel in the setting of aspirin and clopidogrel.    Past Medical History:  Diagnosis Date  . CAD (coronary artery disease) 10/02/2010   nuclear study show normal perfusion on medical therapy without scar or ischemia. EF 64%  . GERD (gastroesophageal reflux disease)   . Heart murmur    "mild one" (12/19/2017)  . Hematochezia 11/19/2009  . History of blood transfusion    "low HgB" (12/19/2017)  . Hyperlipidemia   . Hypertension   . Iron deficiency anemia 11/19/2009  . Osteoarthritis    "was in my knees" (12/19/2017)  . PAD (peripheral artery disease) ([place])   . 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 Sleep apnea    "trial mask in the 1990s; haven't used one since" (12/19/2017)  . Type II diabetes mellitus ([place])     Past Surgical History:  Procedure Laterality Date  . ABDOMINAL AORTOGRAM W/LOWER EXTREMITY N/A 12/19/2017   Procedure: ABDOMINAL AORTOGRAM W/LOWER EXTREMITY;  Surgeon: Lorretta Harp, MD;  Location: [place] [facility];  Service: Cardiovascular;  Laterality: N/A;  . CARDIAC CATHETERIZATION  08/1999   which revealed a mild coronary artery disease with 60 and 70 and 80% stenosis in a small first diagonal vessel at the LAD, 50% mid LAD stenosis, 50% ostial left circumflrx stenosis, and 40% ostial and proximal intermediate stenosis. he had 10 to 20% irregularities of his mid right coronary artery.  . CHOLECYSTECTOMY N/A 07/08/2017   Procedure: LAPAROSCOPIC CHOLECYSTECTOMY;  Surgeon: Aviva Signs, MD;  [facility];  Service: General;   Laterality: N/A;  . CIRCUMCISION  07/17/2012   Procedure: CIRCUMCISION ADULT;  Surgeon: Dutch Gray, MD;  [facility];  Service: Urology;  Laterality: N/A;  . COLONOSCOPY  02/14/06   M JENKINS  . COLONOSCOPY N/A 10/28/2014   Procedure: COLONOSCOPY;  Surgeon: Rogene Houston, MD;  [facility];  Service: Endoscopy;  Laterality: N/A;  730  . ENTEROSCOPY N/A 10/04/2014   Procedure: ENTEROSCOPY;  Surgeon: Carol Ada, MD;  [facility];  Service: Endoscopy;  Laterality: N/A;  . ERCP  12/25/1996   ROURK  . GIVENS CAPSULE STUDY N/A 10/04/2014   Procedure: GIVENS CAPSULE STUDY;  Surgeon: Carol Ada, MD;  Location: [place];  Service: Endoscopy;  Laterality: N/A;  . JOINT REPLACEMENT    . PERIPHERAL VASCULAR ATHERECTOMY Left 12/19/2017   Procedure: PERIPHERAL VASCULAR ATHERECTOMY;  Surgeon: Lorretta Harp, MD;  Location: [place] [facility];  Service: Cardiovascular;  Laterality: Left;  . PERIPHERAL VASCULAR INTERVENTION Left 12/19/2017   Procedure: PERIPHERAL VASCULAR INTERVENTION;  Surgeon: Lorretta Harp, MD;  Location: [place] [facility];  Service: Cardiovascular;  Laterality: Left;  stent  . SMALL BOWEL GIVENS  11/25/2008  . TOTAL KNEE ARTHROPLASTY  2007-2008  . UPPER GASTROINTESTINAL ENDOSCOPY  11/08/2008   EGD TCS    Family History  Problem Relation Age of Onset  . Healthy Sister   . Healthy Brother   . Healthy Sister   . Healthy Brother   .  Healthy Brother   . Healthy Daughter   . Healthy Daughter    Social History:  reports that he quit smoking about 3 years ago. His smoking use included cigarettes. He has a 100.00 pack-year smoking history. He has never used smokeless tobacco. He reports that he does not drink alcohol or use drugs.  Allergies:  Allergies  Allergen Reactions  . Morphine And Related Itching    Medications Prior to Admission  Medication Sig Dispense Refill  . amLODipine (NORVASC) 10 MG tablet Take 1 tablet (10 mg total) by mouth  daily. 90 tablet 3  . aspirin 81 MG tablet Take 1 tablet (81 mg total) by mouth daily. HOLD FOR 1 WEEK, THEN RESUME IF NO BLEEDING PROBLEMS 30 tablet   . atorvastatin (LIPITOR) 40 MG tablet TAKE 1 TABLET EVERY DAY 90 tablet 3  . cetirizine (ZYRTEC) 10 MG tablet Take 10 mg by mouth daily.    . clopidogrel (PLAVIX) 75 MG tablet Take 1 tablet (75 mg total) by mouth daily with breakfast. 90 tablet 3  . empagliflozin (JARDIANCE) 25 MG TABS tablet Take 25 mg by mouth daily.     . ferrous sulfate 325 (65 FE) MG EC tablet Take 1 tablet (325 mg total) by mouth 2 (two) times daily after a meal. (Patient taking differently: Take 325 mg by mouth daily with breakfast. )  3  . finasteride (PROSCAR) 5 MG tablet Take 5 mg by mouth daily.    Marland Kitchen gabapentin (NEURONTIN) 300 MG capsule Take 300 mg by mouth at bedtime.    . Insulin Degludec-Liraglutide (XULTOPHY) 100-3.6 UNIT-MG/ML SOPN Inject 26 Units into the skin daily.    . metFORMIN (GLUMETZA) 1000 MG (MOD) 24 hr tablet Take 1,000 mg by mouth 2 (two) times daily.     . ramipril (ALTACE) 10 MG capsule TAKE 1 CAPSULE EVERY DAY 90 capsule 2  . tamsulosin (FLOMAX) 0.4 MG CAPS capsule Take 0.4 mg by mouth.      No results found for this or any previous visit (from the past 48 hour(s)). No results found.  ROS  There were no vitals taken for this visit. 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  Small bowel given capsule study.  Hildred Laser, MD 08/04/2018, 11:13 AM

## 2018-08-07 ENCOUNTER — Encounter (HOSPITAL_COMMUNITY): Payer: Self-pay | Admitting: Internal Medicine

## 2018-08-09 ENCOUNTER — Telehealth (INDEPENDENT_AMBULATORY_CARE_PROVIDER_SITE_OTHER): Payer: Self-pay | Admitting: Internal Medicine

## 2018-08-09 NOTE — Telephone Encounter (Signed)
Patient came into the office wanting to know the results from the Givens capsule study - was advised he would know something within 7 to 10 business days.

## 2018-08-09 NOTE — Telephone Encounter (Signed)
Noted the patient will be called once those results have been reviewed by Dr.Rehman.

## 2018-08-15 DIAGNOSIS — M7981 Nontraumatic hematoma of soft tissue: Secondary | ICD-10-CM | POA: Diagnosis not present

## 2018-08-15 DIAGNOSIS — S76312A Strain of muscle, fascia and tendon of the posterior muscle group at thigh level, left thigh, initial encounter: Secondary | ICD-10-CM | POA: Diagnosis not present

## 2018-08-17 DIAGNOSIS — K31819 Angiodysplasia of stomach and duodenum without bleeding: Secondary | ICD-10-CM | POA: Diagnosis not present

## 2018-08-17 DIAGNOSIS — D509 Iron deficiency anemia, unspecified: Secondary | ICD-10-CM | POA: Diagnosis not present

## 2018-08-17 DIAGNOSIS — Z9889 Other specified postprocedural states: Secondary | ICD-10-CM | POA: Diagnosis not present

## 2018-08-18 NOTE — Op Note (Signed)
Small Bowel Givens Capsule Study Procedure date: August 04, 2018  Referring Provider: Delphina Cahill, MD PCP:  Dr. [name], Edwinna Areola, MD  Indication for procedure:   Patient is 68 year old Caucasian male who has a history of iron deficiency anemia secondary to GI bleed from small bowel angiodysplasia in 2016 who presents with recurrent GI bleed iron deficiency anemia while on aspirin and clopidogrel.  Patient has received iron infusion and his hemoglobin has responded nicely and was 11.2 g 1 week earlier.  Findings:  Patient was able to swallow given capsule without any difficulty. Stool reached in 7 hours 59 minutes and 6 seconds. 2 small nonbleeding AV malformations noted in what is felt to be proximal jejunum.  These are best seen on images at [id number] and [id number]. Speck of black material noted on image at [id number].   First Gastric image: 36 sec First Duodenal image: 7 min and 26 sec First Ileo-Cecal Valve image: 4 hrs 20 min and 5 sec First Cecal image: 4 hrs 20 min and 35 sec Gastric Passage time: 5 min and 50 sec Small Bowel Passage time: 4 hrs and 13 min  Summary & Recommendations:  2 small nonbleeding arteriovenous malformations in proximal jejunum. No fresh or old blood noted in small bowel. Patient has evidence of overt GI bleed further evaluation would be considered which include endoscopic evaluation or CT angios abdomen. Patient was advised to resume p.o. iron he is to have H&H by Dr. Delphina Cahill next month.

## 2018-08-21 DIAGNOSIS — E1169 Type 2 diabetes mellitus with other specified complication: Secondary | ICD-10-CM | POA: Diagnosis not present

## 2018-08-21 DIAGNOSIS — I10 Essential (primary) hypertension: Secondary | ICD-10-CM | POA: Diagnosis not present

## 2018-08-21 DIAGNOSIS — D500 Iron deficiency anemia secondary to blood loss (chronic): Secondary | ICD-10-CM | POA: Diagnosis not present

## 2018-08-21 DIAGNOSIS — E782 Mixed hyperlipidemia: Secondary | ICD-10-CM | POA: Diagnosis not present

## 2018-08-28 DIAGNOSIS — B351 Tinea unguium: Secondary | ICD-10-CM | POA: Diagnosis not present

## 2018-08-28 DIAGNOSIS — E1151 Type 2 diabetes mellitus with diabetic peripheral angiopathy without gangrene: Secondary | ICD-10-CM | POA: Diagnosis not present

## 2018-08-28 DIAGNOSIS — L851 Acquired keratosis [keratoderma] palmaris et plantaris: Secondary | ICD-10-CM | POA: Diagnosis not present

## 2018-08-28 DIAGNOSIS — M79675 Pain in left toe(s): Secondary | ICD-10-CM | POA: Diagnosis not present

## 2018-08-28 DIAGNOSIS — M79674 Pain in right toe(s): Secondary | ICD-10-CM | POA: Diagnosis not present

## 2018-08-28 DIAGNOSIS — M7740 Metatarsalgia, unspecified foot: Secondary | ICD-10-CM | POA: Diagnosis not present

## 2018-09-15 DIAGNOSIS — Z6829 Body mass index (BMI) 29.0-29.9, adult: Secondary | ICD-10-CM | POA: Diagnosis not present

## 2018-09-15 DIAGNOSIS — D72829 Elevated white blood cell count, unspecified: Secondary | ICD-10-CM | POA: Diagnosis not present

## 2018-09-15 DIAGNOSIS — E782 Mixed hyperlipidemia: Secondary | ICD-10-CM | POA: Diagnosis not present

## 2018-09-15 DIAGNOSIS — I10 Essential (primary) hypertension: Secondary | ICD-10-CM | POA: Diagnosis not present

## 2018-09-15 DIAGNOSIS — E1165 Type 2 diabetes mellitus with hyperglycemia: Secondary | ICD-10-CM | POA: Diagnosis not present

## 2018-09-15 DIAGNOSIS — I2510 Atherosclerotic heart disease of native coronary artery without angina pectoris: Secondary | ICD-10-CM | POA: Diagnosis not present

## 2018-09-15 DIAGNOSIS — N400 Enlarged prostate without lower urinary tract symptoms: Secondary | ICD-10-CM | POA: Diagnosis not present

## 2018-09-15 DIAGNOSIS — D509 Iron deficiency anemia, unspecified: Secondary | ICD-10-CM | POA: Diagnosis not present

## 2018-09-20 DIAGNOSIS — E1169 Type 2 diabetes mellitus with other specified complication: Secondary | ICD-10-CM | POA: Diagnosis not present

## 2018-09-20 DIAGNOSIS — L408 Other psoriasis: Secondary | ICD-10-CM | POA: Diagnosis not present

## 2018-09-20 DIAGNOSIS — N400 Enlarged prostate without lower urinary tract symptoms: Secondary | ICD-10-CM | POA: Diagnosis not present

## 2018-09-20 DIAGNOSIS — D500 Iron deficiency anemia secondary to blood loss (chronic): Secondary | ICD-10-CM | POA: Diagnosis not present

## 2018-09-20 DIAGNOSIS — I739 Peripheral vascular disease, unspecified: Secondary | ICD-10-CM | POA: Diagnosis not present

## 2018-09-20 DIAGNOSIS — E782 Mixed hyperlipidemia: Secondary | ICD-10-CM | POA: Diagnosis not present

## 2018-09-20 DIAGNOSIS — I10 Essential (primary) hypertension: Secondary | ICD-10-CM | POA: Diagnosis not present

## 2018-09-20 DIAGNOSIS — Z87891 Personal history of nicotine dependence: Secondary | ICD-10-CM | POA: Diagnosis not present

## 2018-09-22 DIAGNOSIS — D500 Iron deficiency anemia secondary to blood loss (chronic): Secondary | ICD-10-CM | POA: Diagnosis not present

## 2018-09-22 DIAGNOSIS — I10 Essential (primary) hypertension: Secondary | ICD-10-CM | POA: Diagnosis not present

## 2018-09-22 DIAGNOSIS — E782 Mixed hyperlipidemia: Secondary | ICD-10-CM | POA: Diagnosis not present

## 2018-09-22 DIAGNOSIS — N400 Enlarged prostate without lower urinary tract symptoms: Secondary | ICD-10-CM | POA: Diagnosis not present

## 2018-09-22 DIAGNOSIS — I739 Peripheral vascular disease, unspecified: Secondary | ICD-10-CM | POA: Diagnosis not present

## 2018-09-22 DIAGNOSIS — E1169 Type 2 diabetes mellitus with other specified complication: Secondary | ICD-10-CM | POA: Diagnosis not present

## 2018-11-01 DIAGNOSIS — Z0000 Encounter for general adult medical examination without abnormal findings: Secondary | ICD-10-CM | POA: Diagnosis not present

## 2018-11-06 DIAGNOSIS — B351 Tinea unguium: Secondary | ICD-10-CM | POA: Diagnosis not present

## 2018-11-06 DIAGNOSIS — M7740 Metatarsalgia, unspecified foot: Secondary | ICD-10-CM | POA: Diagnosis not present

## 2018-11-06 DIAGNOSIS — E1151 Type 2 diabetes mellitus with diabetic peripheral angiopathy without gangrene: Secondary | ICD-10-CM | POA: Diagnosis not present

## 2018-11-06 DIAGNOSIS — M79674 Pain in right toe(s): Secondary | ICD-10-CM | POA: Diagnosis not present

## 2018-11-06 DIAGNOSIS — L851 Acquired keratosis [keratoderma] palmaris et plantaris: Secondary | ICD-10-CM | POA: Diagnosis not present

## 2018-11-06 DIAGNOSIS — M79675 Pain in left toe(s): Secondary | ICD-10-CM | POA: Diagnosis not present

## 2018-11-17 DIAGNOSIS — D500 Iron deficiency anemia secondary to blood loss (chronic): Secondary | ICD-10-CM | POA: Diagnosis not present

## 2018-11-17 DIAGNOSIS — E1169 Type 2 diabetes mellitus with other specified complication: Secondary | ICD-10-CM | POA: Diagnosis not present

## 2018-11-17 DIAGNOSIS — E782 Mixed hyperlipidemia: Secondary | ICD-10-CM | POA: Diagnosis not present

## 2018-11-17 DIAGNOSIS — N400 Enlarged prostate without lower urinary tract symptoms: Secondary | ICD-10-CM | POA: Diagnosis not present

## 2018-11-17 DIAGNOSIS — I739 Peripheral vascular disease, unspecified: Secondary | ICD-10-CM | POA: Diagnosis not present

## 2018-11-17 DIAGNOSIS — I10 Essential (primary) hypertension: Secondary | ICD-10-CM | POA: Diagnosis not present

## 2018-11-22 DIAGNOSIS — E782 Mixed hyperlipidemia: Secondary | ICD-10-CM | POA: Diagnosis not present

## 2018-11-22 DIAGNOSIS — D500 Iron deficiency anemia secondary to blood loss (chronic): Secondary | ICD-10-CM | POA: Diagnosis not present

## 2018-11-22 DIAGNOSIS — E1169 Type 2 diabetes mellitus with other specified complication: Secondary | ICD-10-CM | POA: Diagnosis not present

## 2018-11-22 DIAGNOSIS — I10 Essential (primary) hypertension: Secondary | ICD-10-CM | POA: Diagnosis not present

## 2018-12-06 DIAGNOSIS — H25813 Combined forms of age-related cataract, bilateral: Secondary | ICD-10-CM | POA: Diagnosis not present

## 2018-12-06 DIAGNOSIS — Z794 Long term (current) use of insulin: Secondary | ICD-10-CM | POA: Diagnosis not present

## 2018-12-06 DIAGNOSIS — E119 Type 2 diabetes mellitus without complications: Secondary | ICD-10-CM | POA: Diagnosis not present

## 2018-12-07 ENCOUNTER — Other Ambulatory Visit: Payer: Self-pay | Admitting: Cardiovascular Disease

## 2018-12-07 DIAGNOSIS — I739 Peripheral vascular disease, unspecified: Secondary | ICD-10-CM

## 2018-12-12 DIAGNOSIS — I10 Essential (primary) hypertension: Secondary | ICD-10-CM | POA: Diagnosis not present

## 2018-12-12 DIAGNOSIS — E782 Mixed hyperlipidemia: Secondary | ICD-10-CM | POA: Diagnosis not present

## 2018-12-12 DIAGNOSIS — D509 Iron deficiency anemia, unspecified: Secondary | ICD-10-CM | POA: Diagnosis not present

## 2018-12-12 DIAGNOSIS — D500 Iron deficiency anemia secondary to blood loss (chronic): Secondary | ICD-10-CM | POA: Diagnosis not present

## 2018-12-12 DIAGNOSIS — E1169 Type 2 diabetes mellitus with other specified complication: Secondary | ICD-10-CM | POA: Diagnosis not present

## 2018-12-12 DIAGNOSIS — E1165 Type 2 diabetes mellitus with hyperglycemia: Secondary | ICD-10-CM | POA: Diagnosis not present

## 2018-12-20 DIAGNOSIS — J019 Acute sinusitis, unspecified: Secondary | ICD-10-CM | POA: Diagnosis not present

## 2018-12-20 DIAGNOSIS — D500 Iron deficiency anemia secondary to blood loss (chronic): Secondary | ICD-10-CM | POA: Diagnosis not present

## 2018-12-20 DIAGNOSIS — E1169 Type 2 diabetes mellitus with other specified complication: Secondary | ICD-10-CM | POA: Diagnosis not present

## 2018-12-20 DIAGNOSIS — L408 Other psoriasis: Secondary | ICD-10-CM | POA: Diagnosis not present

## 2018-12-20 DIAGNOSIS — I10 Essential (primary) hypertension: Secondary | ICD-10-CM | POA: Diagnosis not present

## 2018-12-20 DIAGNOSIS — I739 Peripheral vascular disease, unspecified: Secondary | ICD-10-CM | POA: Diagnosis not present

## 2018-12-20 DIAGNOSIS — N400 Enlarged prostate without lower urinary tract symptoms: Secondary | ICD-10-CM | POA: Diagnosis not present

## 2018-12-20 DIAGNOSIS — E782 Mixed hyperlipidemia: Secondary | ICD-10-CM | POA: Diagnosis not present

## 2018-12-20 DIAGNOSIS — Z87891 Personal history of nicotine dependence: Secondary | ICD-10-CM | POA: Diagnosis not present

## 2018-12-29 ENCOUNTER — Other Ambulatory Visit: Payer: Self-pay | Admitting: Cardiovascular Disease

## 2019-01-04 ENCOUNTER — Other Ambulatory Visit: Payer: Self-pay | Admitting: Cardiovascular Disease

## 2019-01-09 DIAGNOSIS — E119 Type 2 diabetes mellitus without complications: Secondary | ICD-10-CM | POA: Diagnosis not present

## 2019-01-09 DIAGNOSIS — J309 Allergic rhinitis, unspecified: Secondary | ICD-10-CM | POA: Diagnosis not present

## 2019-01-09 DIAGNOSIS — R221 Localized swelling, mass and lump, neck: Secondary | ICD-10-CM | POA: Diagnosis not present

## 2019-01-09 DIAGNOSIS — J019 Acute sinusitis, unspecified: Secondary | ICD-10-CM | POA: Diagnosis not present

## 2019-01-15 DIAGNOSIS — E1151 Type 2 diabetes mellitus with diabetic peripheral angiopathy without gangrene: Secondary | ICD-10-CM | POA: Diagnosis not present

## 2019-01-15 DIAGNOSIS — M79675 Pain in left toe(s): Secondary | ICD-10-CM | POA: Diagnosis not present

## 2019-01-15 DIAGNOSIS — B351 Tinea unguium: Secondary | ICD-10-CM | POA: Diagnosis not present

## 2019-01-15 DIAGNOSIS — L851 Acquired keratosis [keratoderma] palmaris et plantaris: Secondary | ICD-10-CM | POA: Diagnosis not present

## 2019-01-15 DIAGNOSIS — M79674 Pain in right toe(s): Secondary | ICD-10-CM | POA: Diagnosis not present

## 2019-01-26 ENCOUNTER — Encounter: Payer: Self-pay | Admitting: Cardiovascular Disease

## 2019-01-26 ENCOUNTER — Ambulatory Visit (INDEPENDENT_AMBULATORY_CARE_PROVIDER_SITE_OTHER): Payer: Medicare Other | Admitting: Cardiovascular Disease

## 2019-01-26 ENCOUNTER — Other Ambulatory Visit: Payer: Self-pay

## 2019-01-26 DIAGNOSIS — I739 Peripheral vascular disease, unspecified: Secondary | ICD-10-CM | POA: Diagnosis not present

## 2019-01-26 DIAGNOSIS — E782 Mixed hyperlipidemia: Secondary | ICD-10-CM | POA: Diagnosis not present

## 2019-01-26 DIAGNOSIS — I10 Essential (primary) hypertension: Secondary | ICD-10-CM

## 2019-01-26 NOTE — Assessment & Plan Note (Signed)
History of hyperlipidemia on statin therapy with lipid profile performed by his PCP 12/12/2018 revealing total cholesterol of 158.  His LDL in the past has been 86.

## 2019-01-26 NOTE — Assessment & Plan Note (Signed)
History of peripheral arterial disease status post diamondback orbital rotational atherectomy, PTA and covered stenting of a calcified ostial left common iliac artery using a 7 mm x 39 mm long VBX covered stent reducing a 99% stenosis to 0% residual.  He did have a 50% right iliac stenosis with a 15 to 20 mm gradient although he denies symptoms in the side.  His claudication completely resolved.  He is now able to play golf 3 to 4 days a week without limitation.  Recent Dopplers performed 07/28/2018 revealed a widely patent iliac stent.  He has been on aspirin Plavix.  Because of iron deficiency anemia I have told him that it is okay to stop his Plavix at this time.

## 2019-01-26 NOTE — Progress Notes (Signed)
01/26/2019 Luke [name]   03-11-1951  [id number]  Primary Physician Celene Squibb, MD Primary Cardiologist: Lorretta Harp MD Garret Reddish, [place], [place]  HPI:  Luke [name] is a 68 y.o.  Luke [name] is a 68 y.o.  thin appearing married Caucasian male father of 2, grandfather of 4 grandchildren who I last saw in the office 01/20/2018.Apparently I took care of her father 15 years ago, [name].He was referred by Beckie Busing, NP for peripheral vascular evaluation because of lifestyle limiting claudication. His risk factors include greater than 100 pack years of tobacco abuse having quit 1 year ago, treated hypertension, diabetes and hyperlipidemia. He is never had a heart attack or stroke. He denies chest pain or shortness of breath. He does have documented coronary disease by Dr. Claiborne Billings years ago but was never intervened on. He is complained of lower extremity claudication for the last 4 years which has gone undiagnosed and treated as a "orthopedic issue". Recent Dopplers performed in our office 12/12/2017 revealed a right ABI of 0.90 and left 0.51 with high-frequency signals in both iliac arteries. I suspect this left common iliac is either occluded or or subtotally occluded. He wishes to proceed with angiography and intervention.  I performed peripheral angiography on him 12/19/2017 revealing a 91% calcified ostial left common iliac artery stenosis and a 50% right.  I performed Dynabac orbital rotational atherectomy, PTA and covered stenting on him with an excellent angiographic and clinical result.  Follow-up Dopplers performed subsequent to that revealed increase in his left ABI from 0.51 up to 0.85.  His claudication has completely resolved.  Since I saw him in the office a year ago he is done well.  He denies chest pain, shortness of breath or claudication.  He plays golf several days a week without limitation.  He is sheltering in place and socially distancing.  He continues to remain  tobacco free for the last 2 years.   Current Meds  Medication Sig   amLODipine (NORVASC) 10 MG tablet TAKE 1 TABLET EVERY DAY   aspirin 81 MG tablet Take 1 tablet (81 mg total) by mouth daily. HOLD FOR 1 WEEK, THEN RESUME IF NO BLEEDING PROBLEMS   atorvastatin (LIPITOR) 40 MG tablet Take 1 tablet (40 mg total) by mouth daily. OFFICE VISIT NEEDED   cetirizine (ZYRTEC) 10 MG tablet Take 10 mg by mouth daily.   empagliflozin (JARDIANCE) 25 MG TABS tablet Take 25 mg by mouth daily.    ferrous sulfate 325 (65 FE) MG EC tablet Take 1 tablet (325 mg total) by mouth 2 (two) times daily after a meal. (Patient taking differently: Take 325 mg by mouth daily with breakfast. )   finasteride (PROSCAR) 5 MG tablet Take 5 mg by mouth daily.   gabapentin (NEURONTIN) 300 MG capsule Take 300 mg by mouth at bedtime.   Insulin Degludec-Liraglutide (XULTOPHY) 100-3.6 UNIT-MG/ML SOPN Inject 24 Units into the skin daily.    metFORMIN (GLUMETZA) 1000 MG (MOD) 24 hr tablet Take 1,000 mg by mouth 2 (two) times daily.    ramipril (ALTACE) 10 MG capsule TAKE 1 CAPSULE EVERY DAY   tamsulosin (FLOMAX) 0.4 MG CAPS capsule Take 0.4 mg by mouth.   [DISCONTINUED] clopidogrel (PLAVIX) 75 MG tablet TAKE 1 TABLET EVERY DAY WITH BREAKFAST     Allergies  Allergen Reactions   Morphine And Related Itching    Social History   Socioeconomic History   Marital status: Married    Spouse name: Not  on file   Number of children: Not on file   Years of education: Not on file   Highest education level: Not on file  Occupational History   Not on file  Social Needs   Financial resource strain: Not on file   Food insecurity    Worry: Not on file    Inability: Not on file   Transportation needs    Medical: Not on file    Non-medical: Not on file  Tobacco Use   Smoking status: Former Smoker    Packs/day: 2.00    Years: 50.00    Pack years: 100.00    Types: Cigarettes    Quit date: 09/28/2014    Years  since quitting: 4.3   Smokeless tobacco: Never Used  Substance and Sexual Activity   Alcohol use: Never    Alcohol/week: 0.0 standard drinks    Frequency: Never   Drug use: Never   Sexual activity: Not Currently  Lifestyle   Physical activity    Days per week: Not on file    Minutes per session: Not on file   Stress: Not on file  Relationships   Social connections    Talks on phone: Not on file    Gets together: Not on file    Attends religious service: Not on file    Active member of club or organization: Not on file    Attends meetings of clubs or organizations: Not on file    Relationship status: Not on file   Intimate partner violence    Fear of current or ex partner: Not on file    Emotionally abused: Not on file    Physically abused: Not on file    Forced sexual activity: Not on file  Other Topics Concern   Not on file  Social History Narrative   Not on file     Review of Systems: General: negative for chills, fever, night sweats or weight changes.  Cardiovascular: negative for chest pain, dyspnea on exertion, edema, orthopnea, palpitations, paroxysmal nocturnal dyspnea or shortness of breath Dermatological: negative for rash Respiratory: negative for cough or wheezing Urologic: negative for hematuria Abdominal: negative for nausea, vomiting, diarrhea, bright red blood per rectum, melena, or hematemesis Neurologic: negative for visual changes, syncope, or dizziness All other systems reviewed and are otherwise negative except as noted above.    Blood pressure 128/66, pulse 75, temperature (!) 97.3 F (36.3 C), height 5\' 6"  (1.676 m), weight 169 lb (76.7 kg).  General appearance: alert and no distress Neck: no adenopathy, no carotid bruit, no JVD, supple, symmetrical, trachea midline and thyroid not enlarged, symmetric, no tenderness/mass/nodules Lungs: clear to auscultation bilaterally Heart: regular rate and rhythm, S1, S2 normal, no murmur, click, rub  or gallop Extremities: extremities normal, atraumatic, no cyanosis or edema Pulses: 2+ and symmetric Skin: Skin color, texture, turgor normal. No rashes or lesions Neurologic: Alert and oriented X 3, normal strength and tone. Normal symmetric reflexes. Normal coordination and gait  EKG sinus rhythm at 75 without ST or T wave changes.  I personally reviewed this EKG.  ASSESSMENT AND PLAN:   HTN (hypertension) History of essential hypertension with blood pressure measured at 128/66.  He is on Norvasc and ramipril.  Continue current meds at current dosing.  Hyperlipemia History of hyperlipidemia on statin therapy with lipid profile performed by his PCP 12/12/2018 revealing total cholesterol of 158.  His LDL in the past has been 86.  CAD (coronary artery disease) History of CAD status  post cardiac catheterization performed by Dr. Claiborne Billings in the past that showed moderate CAD though this was never intervened on and he denies ischemic symptoms.  Peripheral arterial disease (HCC) History of peripheral arterial disease status post diamondback orbital rotational atherectomy, PTA and covered stenting of a calcified ostial left common iliac artery using a 7 mm x 39 mm long VBX covered stent reducing a 99% stenosis to 0% residual.  He did have a 50% right iliac stenosis with a 15 to 20 mm gradient although he denies symptoms in the side.  His claudication completely resolved.  He is now able to play golf 3 to 4 days a week without limitation.  Recent Dopplers performed 07/28/2018 revealed a widely patent iliac stent.  He has been on aspirin Plavix.  Because of iron deficiency anemia I have told him that it is okay to stop his Plavix at this time.      Lorretta Harp MD FACP,FACC,FAHA, [facility] 01/26/2019 8:55 AM

## 2019-01-26 NOTE — Assessment & Plan Note (Signed)
History of CAD status post cardiac catheterization performed by Dr. Claiborne Billings in the past that showed moderate CAD though this was never intervened on and he denies ischemic symptoms.

## 2019-01-26 NOTE — Assessment & Plan Note (Signed)
History of essential hypertension with blood pressure measured at 128/66.  He is on Norvasc and ramipril.  Continue current meds at current dosing.

## 2019-01-26 NOTE — Patient Instructions (Signed)
Medication Instructions:  Your physician has recommended you make the following change in your medication:   STOP TAKING YOUR CLOPIDOGREL (PLAVIX)  If you need a refill on your cardiac medications before your next appointment, please call your pharmacy.   Lab work: NONE If you have labs (blood work) drawn today and your tests are completely normal, you will receive your results only by: Marland Kitchen MyChart Message (if you have MyChart) OR . A paper copy in the mail If you have any lab test that is abnormal or we need to change your treatment, we will call you to review the results.  Testing/Procedures: Your physician has requested that you have a lower or upper extremity arterial duplex. This test is an ultrasound of the arteries in the legs or arms. It looks at arterial blood flow in the legs and arms. Allow one hour for Lower and Upper Arterial scans. There are no restrictions or special instructions TO BE SCHEDULED FOR February 2021  Your physician has requested that you have an ankle brachial index (ABI). During this test an ultrasound and blood pressure cuff are used to evaluate the arteries that supply the arms and legs with blood. Allow thirty minutes for this exam. There are no restrictions or special instructions. TO BE SCHEDULED FOR February 2021   Follow-Up: At [facility], you and your health needs are our priority.  As part of our continuing mission to provide you with exceptional heart care, we have created designated Provider Care Teams.  These Care Teams include your primary Cardiologist (physician) and Advanced Practice Providers (APPs -  Physician Assistants and Nurse Practitioners) who all work together to provide you with the care you need, when you need it. You will need a follow up appointment in 12 months WITH DR. Gwenlyn Found.  Please call our office 2 months in advance to schedule this appointment.

## 2019-01-29 ENCOUNTER — Telehealth: Payer: Self-pay

## 2019-01-29 DIAGNOSIS — E782 Mixed hyperlipidemia: Secondary | ICD-10-CM

## 2019-01-29 MED ORDER — ATORVASTATIN CALCIUM 80 MG PO TABS: 80.0000 mg | ORAL_TABLET | Freq: Every day | ORAL | 3 refills | Status: DC

## 2019-01-29 NOTE — Telephone Encounter (Signed)
Follow Up  Patient returning call. Please give patient a call back.  

## 2019-01-29 NOTE — Telephone Encounter (Signed)
Looks like pt had appt 01-26-2019 but it does not look like atorvastatin was increased. See per labs:   Notes recorded by Lorretta Harp, MD on 12/26/2018 at 4:29 PM EDT  Not at goal on atorvastatin 40 mg a day. Increase to 80 mg a day and recheck in 2 months.  Do you still want to increase?? Please advise

## 2019-01-29 NOTE — Telephone Encounter (Signed)
Spoke with pt, he will double the atorvastatin he has and a new script was sent to the pharmacy for the 80 mg atorvastatin. Lab orders mailed to the pt for repeat in 2 months.

## 2019-01-29 NOTE — Telephone Encounter (Signed)
LDL was 99 on 12/12/2018 on PCP labs.  What ever dose of Lipitor he was on at that time please increase the next dose and recheck in 2 months

## 2019-01-29 NOTE — Telephone Encounter (Signed)
Left message for pt to call.

## 2019-02-02 IMAGING — US US ABDOMEN COMPLETE
1 series · 13 of 25 positions shown · non-contrast
Comparison: None in PACs

CLINICAL DATA: Lower abdominal pain. History of recurrent
pancreatitis, diabetes, hyperlipidemia.

EXAM:
ABDOMEN ULTRASOUND COMPLETE

[Series 1: us abdomen complete · 0.19mm/px · 13 of 146 slices shown]
[im 1/146]
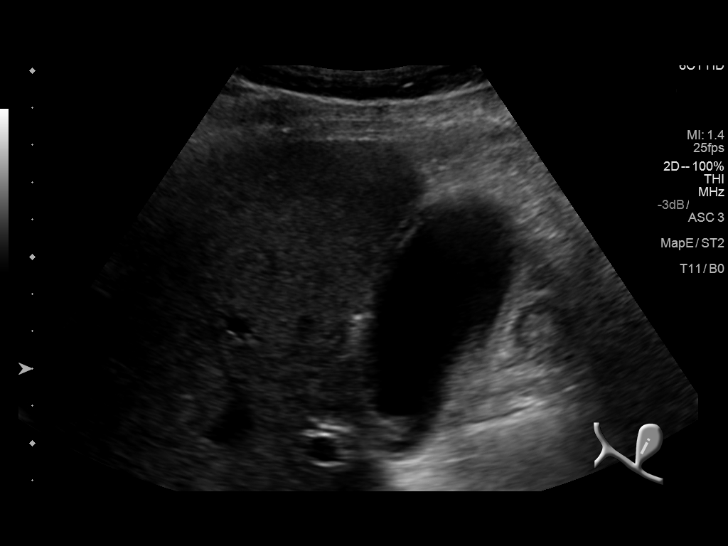
[im 13/146]
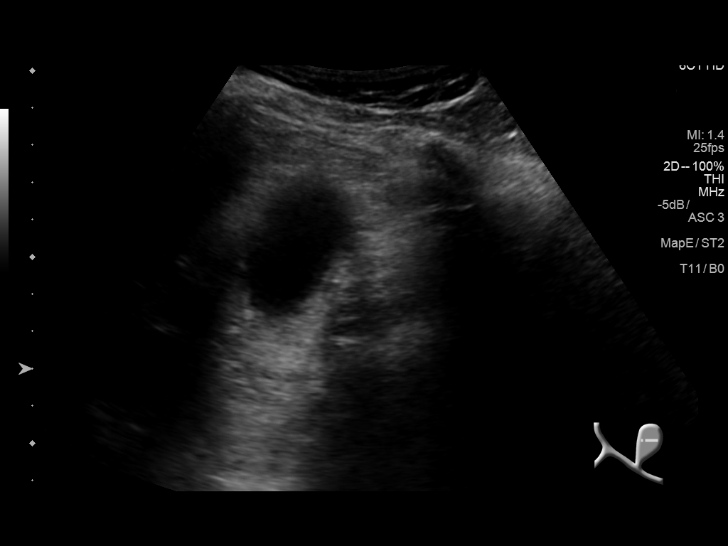
[im 25/146]
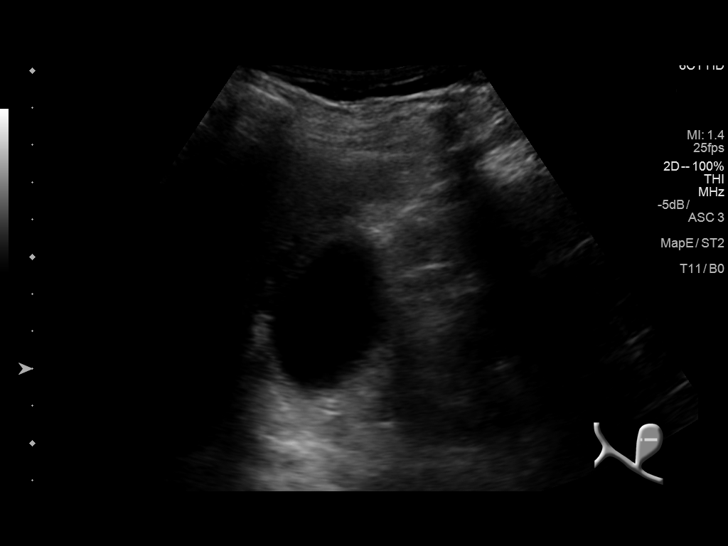
[im 37/146]
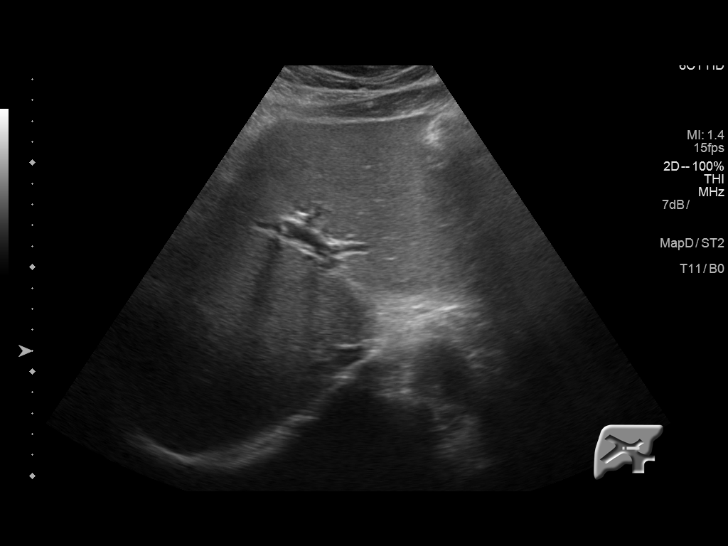
[im 49/146]
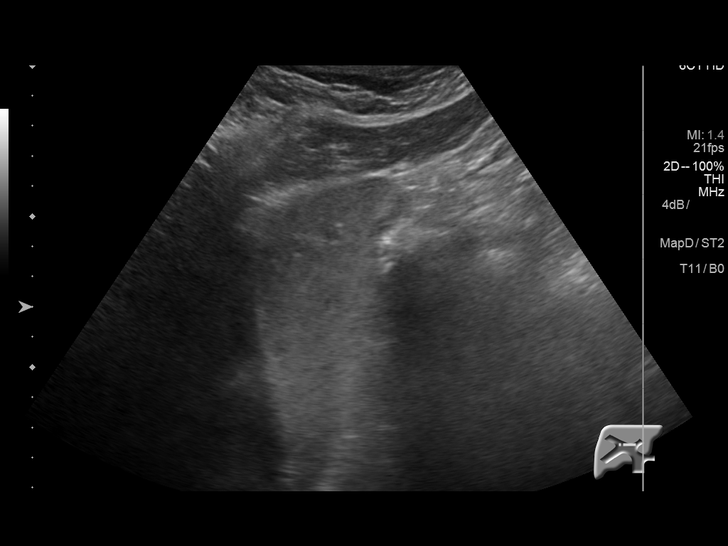
[im 61/146]
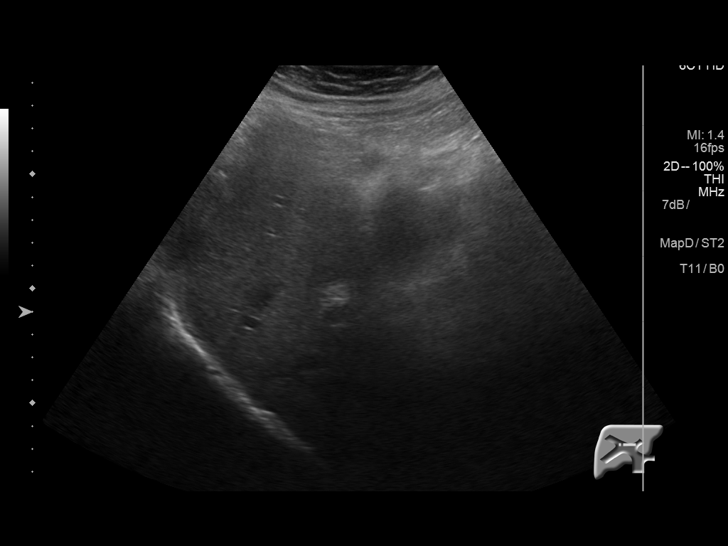
[im 73/146]
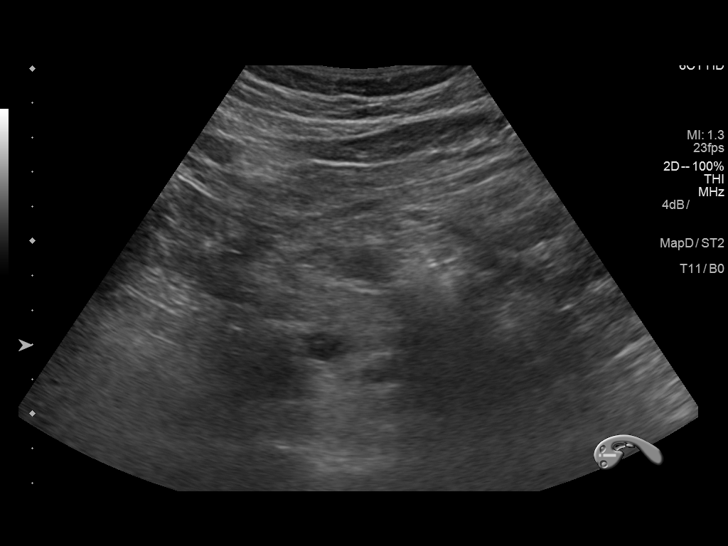
[im 85/146]
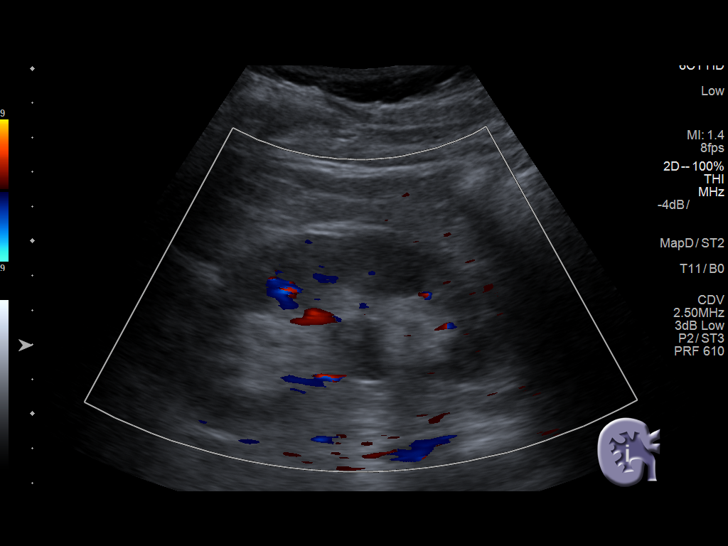
[im 97/146]
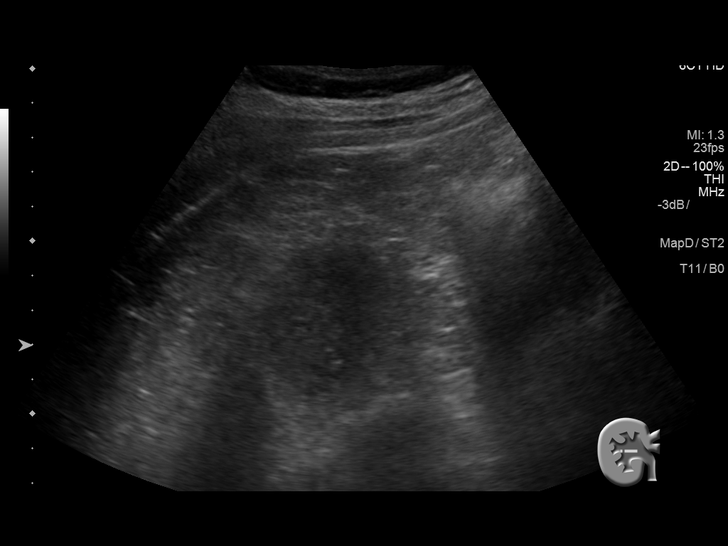
[im 109/146]
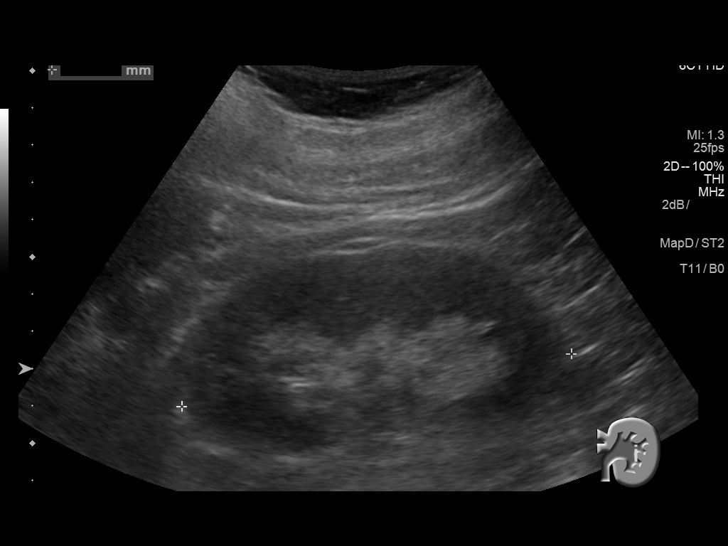
[im 121/146]
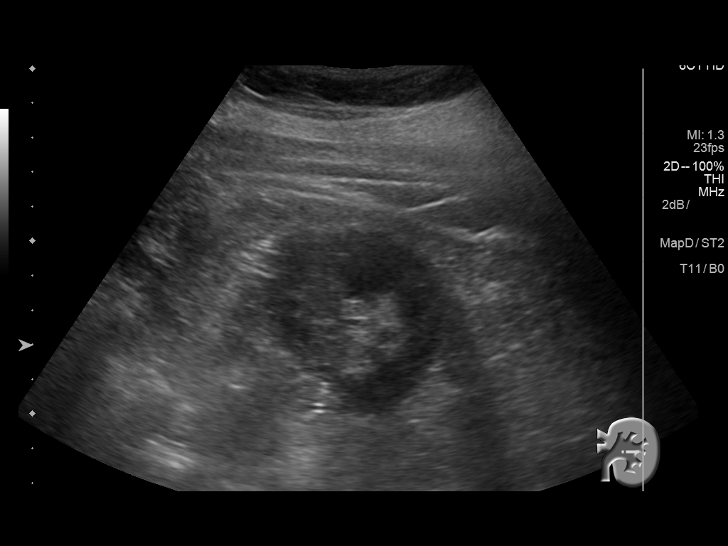
[im 133/146]
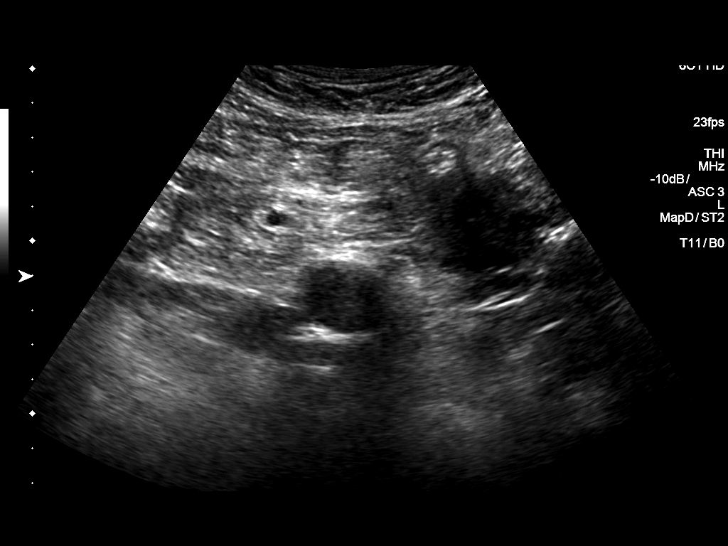
[im 146/146]
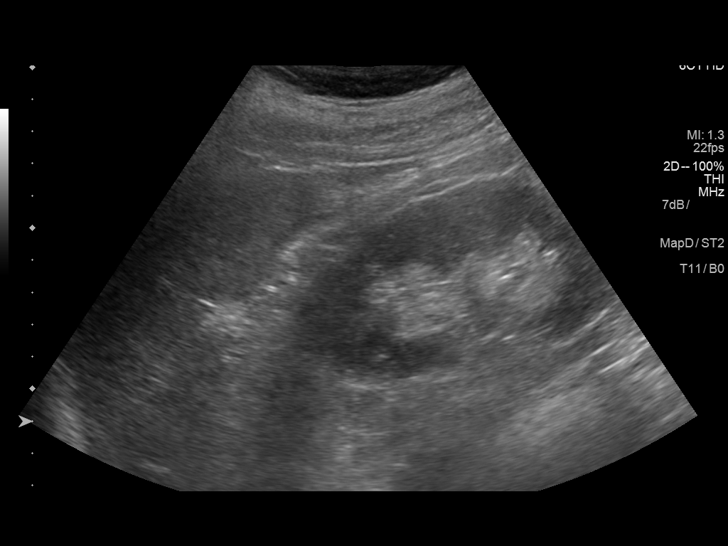

[13 of 25 positions shown; findings below may reference images not displayed]

FINDINGS: Gallbladder: There are gallstones present with the largest measuring
8 mm in diameter. There is mild gallbladder wall thickening but no
pericholecystic fluid or positive sonographic Murphy's sign.

Common bile duct: Diameter: 7.1 mm which is mildly increased. No
intraluminal stones or sludge are observed.

Liver: The hepatic echotexture is mildly increased. The surface
contour is smooth. There is no focal mass nor ductal dilation.
Portal vein is patent on color Doppler imaging with normal direction
of blood flow towards the liver.

IVC: Normal where visualized

Pancreas: Visualization of the pancreatic head and tail is quite
limited due to bowel gas. The pancreatic body is grossly normal.

Spleen: 10.7 cm in length. There is a small accessory spleen in the
hilum measuring 1.6 cm in diameter.

Right Kidney: Length: 11.5 cm.. The renal cortical echotexture
remains lower than that of the liver. There is mild fullness of the
renal pelvis.

Left Kidney: Length: 10.6 cm. The renal cortical echotexture is
normal. There is no hydronephrosis.

Abdominal aorta: Limited visualization of the mid aorta due to bowel
gas.

Other findings: There is no ascites.
IMPRESSION: Gallstones without sonographic evidence of acute cholecystitis. Mild
chronic gallbladder inflammation may be present given the mild
gallbladder wall thickening. There is mild dilation of the common
bile duct without intraluminal stones.

Increased hepatic echotexture most compatible with fatty
infiltrative change.

Very mild right-sided hydronephrosis versus an extrarenal pelvis.

## 2019-02-28 ENCOUNTER — Other Ambulatory Visit (INDEPENDENT_AMBULATORY_CARE_PROVIDER_SITE_OTHER): Payer: Self-pay | Admitting: Internal Medicine

## 2019-02-28 ENCOUNTER — Other Ambulatory Visit: Payer: Self-pay | Admitting: Cardiovascular Disease

## 2019-03-06 DIAGNOSIS — J309 Allergic rhinitis, unspecified: Secondary | ICD-10-CM | POA: Diagnosis not present

## 2019-03-06 DIAGNOSIS — R221 Localized swelling, mass and lump, neck: Secondary | ICD-10-CM | POA: Diagnosis not present

## 2019-03-06 DIAGNOSIS — E119 Type 2 diabetes mellitus without complications: Secondary | ICD-10-CM | POA: Diagnosis not present

## 2019-03-06 DIAGNOSIS — J019 Acute sinusitis, unspecified: Secondary | ICD-10-CM | POA: Diagnosis not present

## 2019-03-07 DIAGNOSIS — E119 Type 2 diabetes mellitus without complications: Secondary | ICD-10-CM | POA: Diagnosis not present

## 2019-03-14 ENCOUNTER — Other Ambulatory Visit: Payer: Self-pay | Admitting: Cardiovascular Disease

## 2019-03-19 DIAGNOSIS — Z570 Occupational exposure to noise: Secondary | ICD-10-CM | POA: Diagnosis not present

## 2019-03-19 DIAGNOSIS — Z822 Family history of deafness and hearing loss: Secondary | ICD-10-CM | POA: Diagnosis not present

## 2019-03-19 DIAGNOSIS — H903 Sensorineural hearing loss, bilateral: Secondary | ICD-10-CM | POA: Diagnosis not present

## 2019-04-02 DIAGNOSIS — D509 Iron deficiency anemia, unspecified: Secondary | ICD-10-CM | POA: Diagnosis not present

## 2019-04-02 DIAGNOSIS — E782 Mixed hyperlipidemia: Secondary | ICD-10-CM | POA: Diagnosis not present

## 2019-04-02 DIAGNOSIS — I10 Essential (primary) hypertension: Secondary | ICD-10-CM | POA: Diagnosis not present

## 2019-04-02 DIAGNOSIS — D500 Iron deficiency anemia secondary to blood loss (chronic): Secondary | ICD-10-CM | POA: Diagnosis not present

## 2019-04-02 DIAGNOSIS — E1169 Type 2 diabetes mellitus with other specified complication: Secondary | ICD-10-CM | POA: Diagnosis not present

## 2019-04-02 DIAGNOSIS — E1165 Type 2 diabetes mellitus with hyperglycemia: Secondary | ICD-10-CM | POA: Diagnosis not present

## 2019-04-04 DIAGNOSIS — Z20828 Contact with and (suspected) exposure to other viral communicable diseases: Secondary | ICD-10-CM | POA: Diagnosis not present

## 2019-04-04 DIAGNOSIS — I739 Peripheral vascular disease, unspecified: Secondary | ICD-10-CM | POA: Diagnosis not present

## 2019-04-04 DIAGNOSIS — E1169 Type 2 diabetes mellitus with other specified complication: Secondary | ICD-10-CM | POA: Diagnosis not present

## 2019-04-04 DIAGNOSIS — E782 Mixed hyperlipidemia: Secondary | ICD-10-CM | POA: Diagnosis not present

## 2019-04-04 DIAGNOSIS — I10 Essential (primary) hypertension: Secondary | ICD-10-CM | POA: Diagnosis not present

## 2019-04-04 DIAGNOSIS — R221 Localized swelling, mass and lump, neck: Secondary | ICD-10-CM | POA: Diagnosis not present

## 2019-04-04 DIAGNOSIS — J019 Acute sinusitis, unspecified: Secondary | ICD-10-CM | POA: Diagnosis not present

## 2019-04-04 DIAGNOSIS — N400 Enlarged prostate without lower urinary tract symptoms: Secondary | ICD-10-CM | POA: Diagnosis not present

## 2019-04-04 DIAGNOSIS — Z87891 Personal history of nicotine dependence: Secondary | ICD-10-CM | POA: Diagnosis not present

## 2019-04-04 DIAGNOSIS — D500 Iron deficiency anemia secondary to blood loss (chronic): Secondary | ICD-10-CM | POA: Diagnosis not present

## 2019-04-04 DIAGNOSIS — L408 Other psoriasis: Secondary | ICD-10-CM | POA: Diagnosis not present

## 2019-04-13 DIAGNOSIS — H9201 Otalgia, right ear: Secondary | ICD-10-CM | POA: Diagnosis not present

## 2019-04-13 DIAGNOSIS — J019 Acute sinusitis, unspecified: Secondary | ICD-10-CM | POA: Diagnosis not present

## 2019-04-16 DIAGNOSIS — M79674 Pain in right toe(s): Secondary | ICD-10-CM | POA: Diagnosis not present

## 2019-04-16 DIAGNOSIS — B351 Tinea unguium: Secondary | ICD-10-CM | POA: Diagnosis not present

## 2019-04-16 DIAGNOSIS — M79675 Pain in left toe(s): Secondary | ICD-10-CM | POA: Diagnosis not present

## 2019-04-16 DIAGNOSIS — E1151 Type 2 diabetes mellitus with diabetic peripheral angiopathy without gangrene: Secondary | ICD-10-CM | POA: Diagnosis not present

## 2019-04-16 DIAGNOSIS — L851 Acquired keratosis [keratoderma] palmaris et plantaris: Secondary | ICD-10-CM | POA: Diagnosis not present

## 2019-04-18 ENCOUNTER — Other Ambulatory Visit: Payer: Self-pay | Admitting: Cardiovascular Disease

## 2019-04-20 DIAGNOSIS — N400 Enlarged prostate without lower urinary tract symptoms: Secondary | ICD-10-CM | POA: Diagnosis not present

## 2019-04-20 DIAGNOSIS — R221 Localized swelling, mass and lump, neck: Secondary | ICD-10-CM | POA: Diagnosis not present

## 2019-04-20 DIAGNOSIS — L408 Other psoriasis: Secondary | ICD-10-CM | POA: Diagnosis not present

## 2019-04-20 DIAGNOSIS — J019 Acute sinusitis, unspecified: Secondary | ICD-10-CM | POA: Diagnosis not present

## 2019-04-20 DIAGNOSIS — D500 Iron deficiency anemia secondary to blood loss (chronic): Secondary | ICD-10-CM | POA: Diagnosis not present

## 2019-04-20 DIAGNOSIS — E782 Mixed hyperlipidemia: Secondary | ICD-10-CM | POA: Diagnosis not present

## 2019-04-20 DIAGNOSIS — Z20828 Contact with and (suspected) exposure to other viral communicable diseases: Secondary | ICD-10-CM | POA: Diagnosis not present

## 2019-04-20 DIAGNOSIS — E1169 Type 2 diabetes mellitus with other specified complication: Secondary | ICD-10-CM | POA: Diagnosis not present

## 2019-04-20 DIAGNOSIS — I739 Peripheral vascular disease, unspecified: Secondary | ICD-10-CM | POA: Diagnosis not present

## 2019-04-20 DIAGNOSIS — I10 Essential (primary) hypertension: Secondary | ICD-10-CM | POA: Diagnosis not present

## 2019-04-20 DIAGNOSIS — Z87891 Personal history of nicotine dependence: Secondary | ICD-10-CM | POA: Diagnosis not present

## 2019-04-20 NOTE — Telephone Encounter (Signed)
PER LAST JB NOTE: I have told him that it is okay to stop his Plavix at this time.  [id number]? AUTO REFILL HUMANA?

## 2019-05-02 DIAGNOSIS — Z0000 Encounter for general adult medical examination without abnormal findings: Secondary | ICD-10-CM | POA: Diagnosis not present

## 2019-05-02 DIAGNOSIS — Z23 Encounter for immunization: Secondary | ICD-10-CM | POA: Diagnosis not present

## 2019-07-13 ENCOUNTER — Encounter: Payer: Self-pay | Admitting: Infectious Diseases

## 2019-07-13 DIAGNOSIS — E1165 Type 2 diabetes mellitus with hyperglycemia: Secondary | ICD-10-CM | POA: Diagnosis not present

## 2019-07-13 DIAGNOSIS — E1169 Type 2 diabetes mellitus with other specified complication: Secondary | ICD-10-CM | POA: Diagnosis not present

## 2019-07-13 DIAGNOSIS — E119 Type 2 diabetes mellitus without complications: Secondary | ICD-10-CM | POA: Diagnosis not present

## 2019-07-13 DIAGNOSIS — E782 Mixed hyperlipidemia: Secondary | ICD-10-CM | POA: Diagnosis not present

## 2019-07-13 DIAGNOSIS — D509 Iron deficiency anemia, unspecified: Secondary | ICD-10-CM | POA: Diagnosis not present

## 2019-07-13 DIAGNOSIS — Z6828 Body mass index (BMI) 28.0-28.9, adult: Secondary | ICD-10-CM | POA: Diagnosis not present

## 2019-07-13 DIAGNOSIS — D500 Iron deficiency anemia secondary to blood loss (chronic): Secondary | ICD-10-CM | POA: Diagnosis not present

## 2019-07-13 DIAGNOSIS — I10 Essential (primary) hypertension: Secondary | ICD-10-CM | POA: Diagnosis not present

## 2019-07-14 IMAGING — CT CT ABD-PELV W/ CM
2 of 4 series · 16 of 46 positions shown, 18 images · IV contrast (Isovue)
Comparison: 06/15/2017 abdominal ultrasound.

CLINICAL DATA: 66 y/o M; generalized abdominal pain with 2 episodes
of vomiting.

EXAM:
CT ABDOMEN AND PELVIS WITH CONTRAST
TECHNIQUE: Multidetector CT imaging of the abdomen and pelvis was performed
using the standard protocol following bolus administration of
intravenous contrast.
CONTRAST:  100mL WIZHYI-777 IOPAMIDOL (WIZHYI-777) INJECTION 61%

[Series 2: axial st · axial · 0.78mm/px · z∈[-389,+26]mm · 13 of 93 slices shown, 15 images]
[im 5/93  soft-tissue]
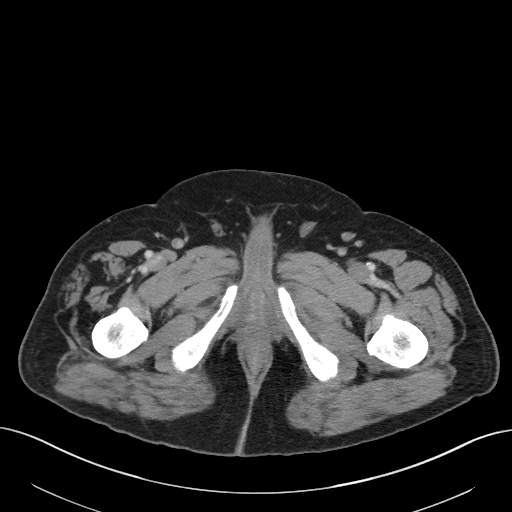
[im 5/93  bone]
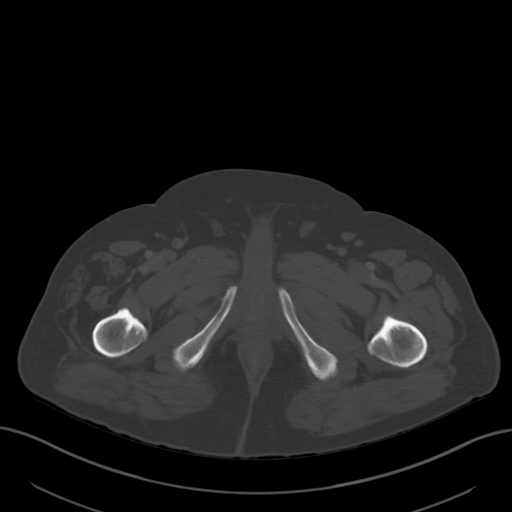
[im 14/93  soft-tissue]
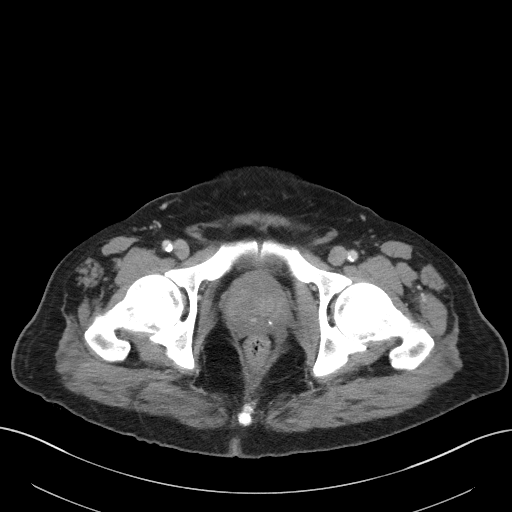
[im 18/93  soft-tissue]
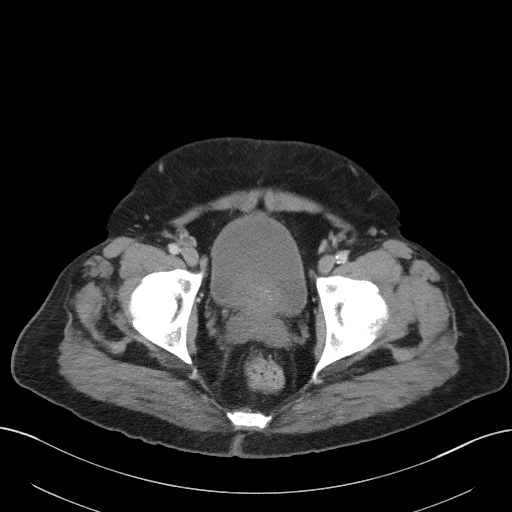
[im 27/93  soft-tissue]
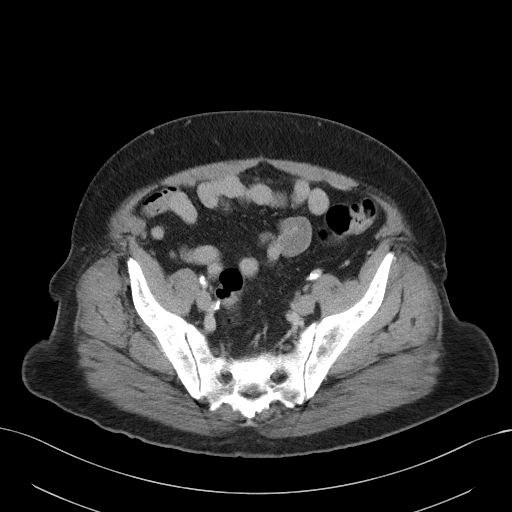
[im 31/93  soft-tissue]
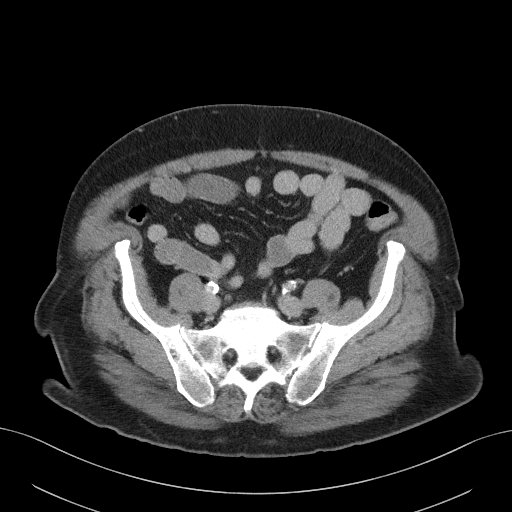
[im 40/93  soft-tissue]
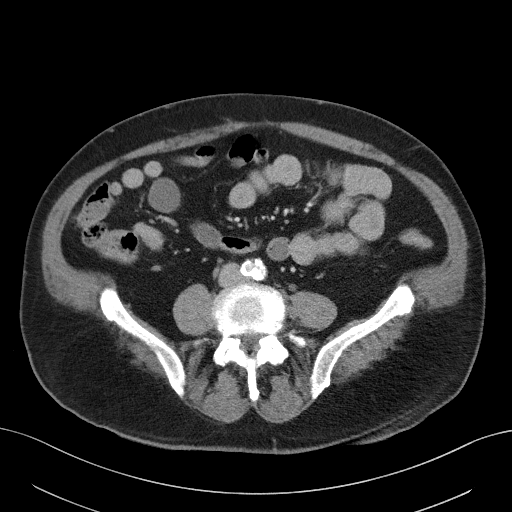
[im 49/93  soft-tissue]
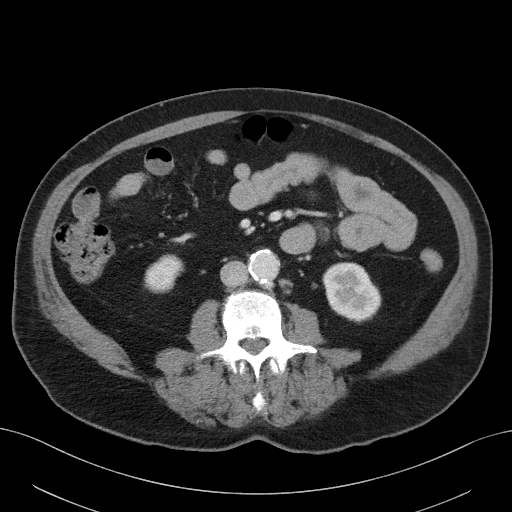
[im 53/93  soft-tissue]
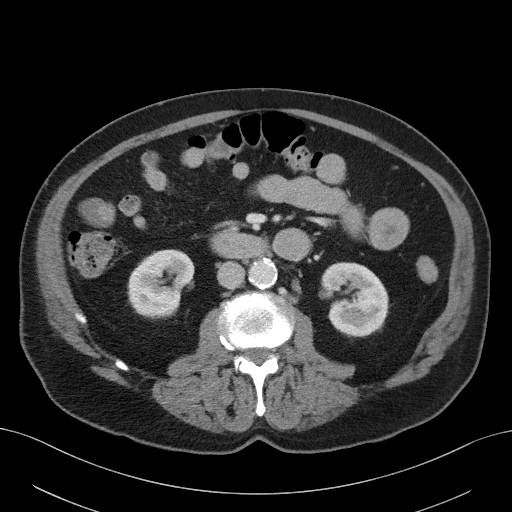
[im 62/93  soft-tissue]
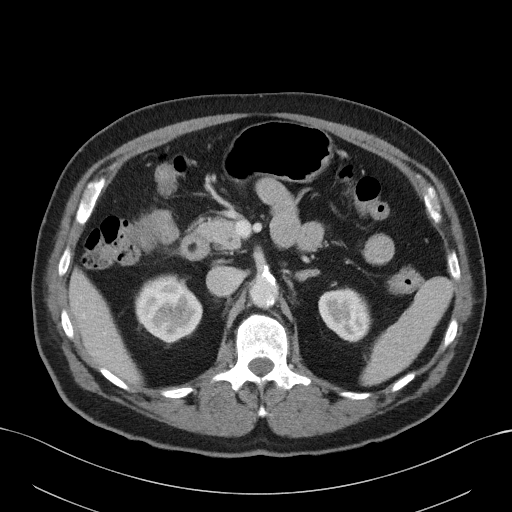
[im 62/93  bone]
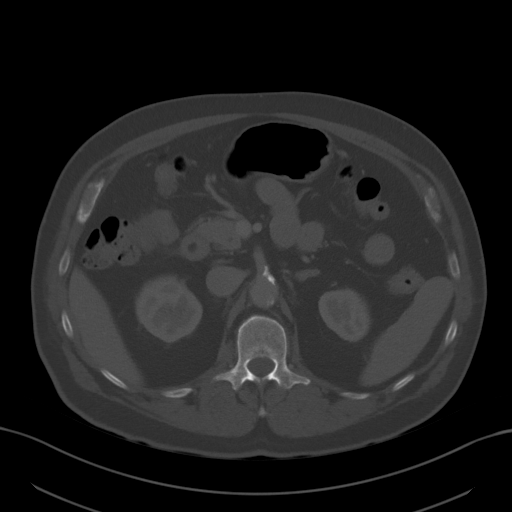
[im 66/93  soft-tissue]
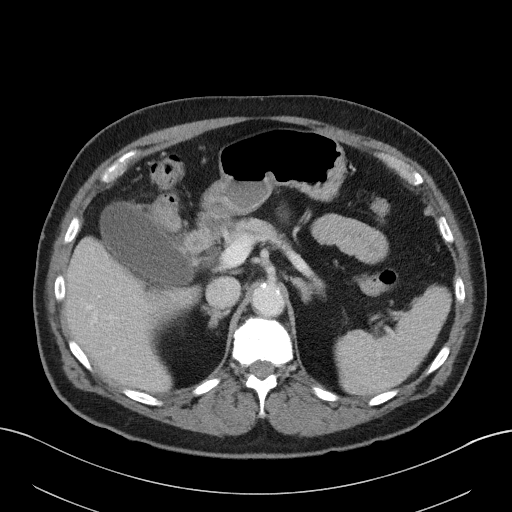
[im 75/93  soft-tissue]
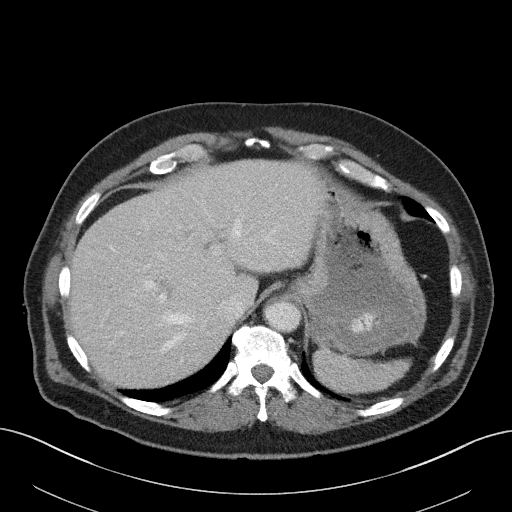
[im 79/93  soft-tissue]
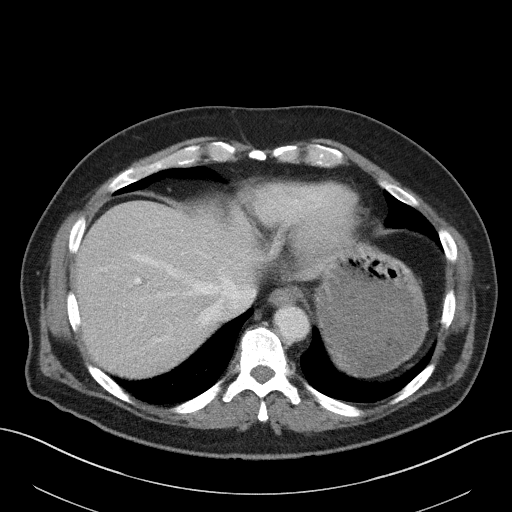
[im 88/93  soft-tissue]
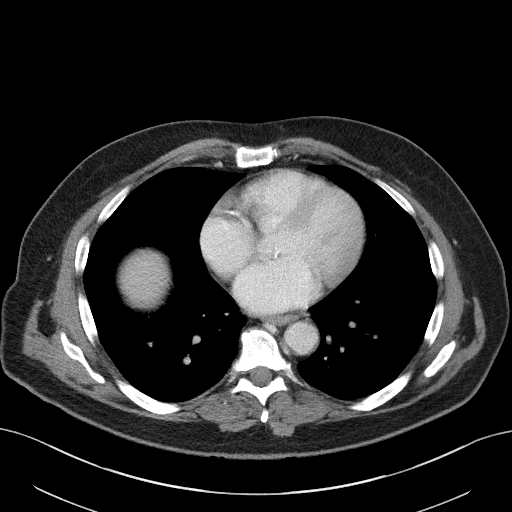

[Series 5: coronal st · coronal · 0.81mm/px · 3 of 99 slices shown]
[im 33/99  soft-tissue]
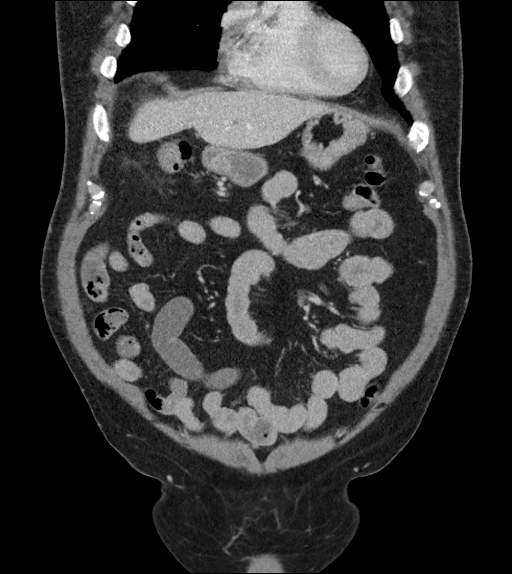
[im 44/99  soft-tissue]
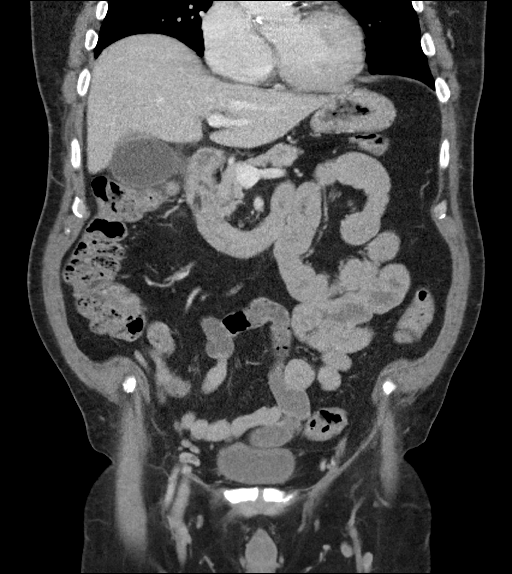
[im 55/99  soft-tissue]
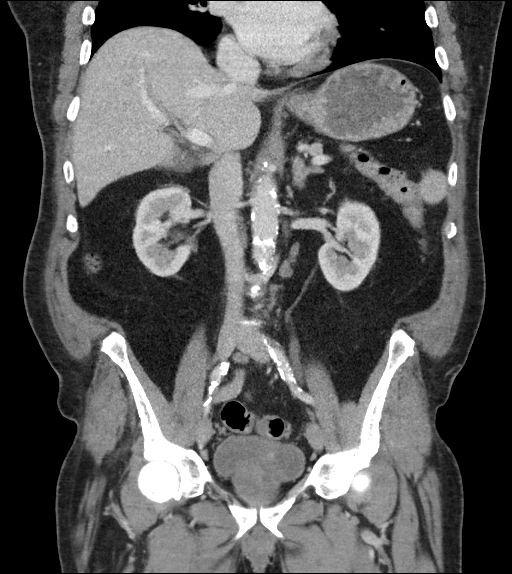

[16 of 46 positions shown; findings below may reference images not displayed]

FINDINGS: Lower chest: No acute abnormality.

Hepatobiliary: No focal liver lesion. Gallbladder wall thickening
and pericholecystic fluid with 7 mm stone at the gallbladder neck
(series 2, image 24). No intrahepatic biliary ductal dilatation.
Common bile duct measures up to 10 mm, no obstructing stone or mass
identified.

Pancreas: Unremarkable. No pancreatic ductal dilatation or
surrounding inflammatory changes.

Spleen: Normal in size without focal abnormality.

Adrenals/Urinary Tract: Adrenal glands are unremarkable. Kidneys are
normal, without renal calculi, focal lesion, or hydronephrosis.
Bladder is unremarkable.

Stomach/Bowel: 16mm probable stromal lipoma in second segment of
duodenum. No obstructive or inflammatory changes of stomach, small
bowel, or large bowel. Normal appendix.

Vascular/Lymphatic: Aortic atherosclerosis. No enlarged abdominal or
pelvic lymph nodes.

Reproductive: Moderate prostate enlargement.

Other: No abdominal wall hernia or abnormality. No abdominopelvic
ascites.

Musculoskeletal: Mild lumbar spine spondylosis. Grade 1 L4-5
anterolisthesis without pars defect. No acute fracture.
IMPRESSION: 1. Gallbladder wall thickening, pericholecystic fluid, and 7 mm
gallstone within gallbladder neck. Findings are compatible with
acute cholecystitis.
2. Moderate prostate enlargement.
3. Probable stromal lipoma and second segment of duodenum.
4. Mild lumbar spine spondylosis.

By: Ivanilso Ubiali M.D.

## 2019-07-18 DIAGNOSIS — I10 Essential (primary) hypertension: Secondary | ICD-10-CM | POA: Diagnosis not present

## 2019-07-18 DIAGNOSIS — I739 Peripheral vascular disease, unspecified: Secondary | ICD-10-CM | POA: Diagnosis not present

## 2019-07-18 DIAGNOSIS — E1169 Type 2 diabetes mellitus with other specified complication: Secondary | ICD-10-CM | POA: Diagnosis not present

## 2019-07-18 DIAGNOSIS — L408 Other psoriasis: Secondary | ICD-10-CM | POA: Diagnosis not present

## 2019-07-18 DIAGNOSIS — Z87891 Personal history of nicotine dependence: Secondary | ICD-10-CM | POA: Diagnosis not present

## 2019-07-18 DIAGNOSIS — N400 Enlarged prostate without lower urinary tract symptoms: Secondary | ICD-10-CM | POA: Diagnosis not present

## 2019-07-18 DIAGNOSIS — E782 Mixed hyperlipidemia: Secondary | ICD-10-CM | POA: Diagnosis not present

## 2019-07-18 DIAGNOSIS — R221 Localized swelling, mass and lump, neck: Secondary | ICD-10-CM | POA: Diagnosis not present

## 2019-07-18 DIAGNOSIS — D500 Iron deficiency anemia secondary to blood loss (chronic): Secondary | ICD-10-CM | POA: Diagnosis not present

## 2019-07-23 DIAGNOSIS — M546 Pain in thoracic spine: Secondary | ICD-10-CM | POA: Diagnosis not present

## 2019-07-23 DIAGNOSIS — M9901 Segmental and somatic dysfunction of cervical region: Secondary | ICD-10-CM | POA: Diagnosis not present

## 2019-07-23 DIAGNOSIS — M9902 Segmental and somatic dysfunction of thoracic region: Secondary | ICD-10-CM | POA: Diagnosis not present

## 2019-07-23 DIAGNOSIS — M545 Low back pain: Secondary | ICD-10-CM | POA: Diagnosis not present

## 2019-07-23 DIAGNOSIS — M9903 Segmental and somatic dysfunction of lumbar region: Secondary | ICD-10-CM | POA: Diagnosis not present

## 2019-07-23 DIAGNOSIS — M542 Cervicalgia: Secondary | ICD-10-CM | POA: Diagnosis not present

## 2019-07-25 DIAGNOSIS — M542 Cervicalgia: Secondary | ICD-10-CM | POA: Diagnosis not present

## 2019-07-25 DIAGNOSIS — M545 Low back pain: Secondary | ICD-10-CM | POA: Diagnosis not present

## 2019-07-25 DIAGNOSIS — M9903 Segmental and somatic dysfunction of lumbar region: Secondary | ICD-10-CM | POA: Diagnosis not present

## 2019-07-25 DIAGNOSIS — M9901 Segmental and somatic dysfunction of cervical region: Secondary | ICD-10-CM | POA: Diagnosis not present

## 2019-07-25 DIAGNOSIS — M9902 Segmental and somatic dysfunction of thoracic region: Secondary | ICD-10-CM | POA: Diagnosis not present

## 2019-07-25 DIAGNOSIS — M546 Pain in thoracic spine: Secondary | ICD-10-CM | POA: Diagnosis not present

## 2019-07-27 DIAGNOSIS — M9903 Segmental and somatic dysfunction of lumbar region: Secondary | ICD-10-CM | POA: Diagnosis not present

## 2019-07-27 DIAGNOSIS — M542 Cervicalgia: Secondary | ICD-10-CM | POA: Diagnosis not present

## 2019-07-27 DIAGNOSIS — M9901 Segmental and somatic dysfunction of cervical region: Secondary | ICD-10-CM | POA: Diagnosis not present

## 2019-07-27 DIAGNOSIS — M546 Pain in thoracic spine: Secondary | ICD-10-CM | POA: Diagnosis not present

## 2019-07-27 DIAGNOSIS — M545 Low back pain: Secondary | ICD-10-CM | POA: Diagnosis not present

## 2019-07-27 DIAGNOSIS — M9902 Segmental and somatic dysfunction of thoracic region: Secondary | ICD-10-CM | POA: Diagnosis not present

## 2019-07-31 ENCOUNTER — Other Ambulatory Visit (HOSPITAL_COMMUNITY): Payer: Self-pay | Admitting: Cardiovascular Disease

## 2019-07-31 ENCOUNTER — Encounter (INDEPENDENT_AMBULATORY_CARE_PROVIDER_SITE_OTHER): Payer: Self-pay

## 2019-07-31 ENCOUNTER — Other Ambulatory Visit: Payer: Self-pay | Admitting: *Deleted

## 2019-07-31 ENCOUNTER — Other Ambulatory Visit: Payer: Self-pay

## 2019-07-31 ENCOUNTER — Ambulatory Visit (HOSPITAL_BASED_OUTPATIENT_CLINIC_OR_DEPARTMENT_OTHER)
Admission: RE | Admit: 2019-07-31 | Discharge: 2019-07-31 | Disposition: A | Discharge status: Home / Self Care | Payer: Medicare HMO | Source: Ambulatory Visit | Attending: Cardiovascular Disease | Admitting: Cardiovascular Disease

## 2019-07-31 ENCOUNTER — Ambulatory Visit (HOSPITAL_COMMUNITY)
Admission: RE | Admit: 2019-07-31 | Discharge: 2019-07-31 | Disposition: A | Discharge status: Home / Self Care | Payer: Medicare HMO | Source: Ambulatory Visit | Attending: Internal Medicine | Admitting: Internal Medicine

## 2019-07-31 DIAGNOSIS — I739 Peripheral vascular disease, unspecified: Secondary | ICD-10-CM

## 2019-07-31 DIAGNOSIS — Z95828 Presence of other vascular implants and grafts: Secondary | ICD-10-CM

## 2019-08-03 DIAGNOSIS — M9903 Segmental and somatic dysfunction of lumbar region: Secondary | ICD-10-CM | POA: Diagnosis not present

## 2019-08-03 DIAGNOSIS — M9901 Segmental and somatic dysfunction of cervical region: Secondary | ICD-10-CM | POA: Diagnosis not present

## 2019-08-03 DIAGNOSIS — M542 Cervicalgia: Secondary | ICD-10-CM | POA: Diagnosis not present

## 2019-08-03 DIAGNOSIS — M546 Pain in thoracic spine: Secondary | ICD-10-CM | POA: Diagnosis not present

## 2019-08-03 DIAGNOSIS — M9902 Segmental and somatic dysfunction of thoracic region: Secondary | ICD-10-CM | POA: Diagnosis not present

## 2019-08-03 DIAGNOSIS — M545 Low back pain: Secondary | ICD-10-CM | POA: Diagnosis not present

## 2019-08-09 ENCOUNTER — Other Ambulatory Visit: Payer: Self-pay | Admitting: Cardiovascular Disease

## 2019-08-22 ENCOUNTER — Encounter: Payer: Self-pay | Admitting: Podiatry

## 2019-08-22 ENCOUNTER — Ambulatory Visit: Payer: Medicare HMO | Admitting: Podiatry

## 2019-08-22 ENCOUNTER — Other Ambulatory Visit: Payer: Self-pay

## 2019-08-22 DIAGNOSIS — M2041 Other hammer toe(s) (acquired), right foot: Secondary | ICD-10-CM

## 2019-08-22 DIAGNOSIS — L400 Psoriasis vulgaris: Secondary | ICD-10-CM | POA: Diagnosis not present

## 2019-08-22 DIAGNOSIS — M205X2 Other deformities of toe(s) (acquired), left foot: Secondary | ICD-10-CM | POA: Diagnosis not present

## 2019-08-22 DIAGNOSIS — D2271 Melanocytic nevi of right lower limb, including hip: Secondary | ICD-10-CM | POA: Diagnosis not present

## 2019-08-22 DIAGNOSIS — M2042 Other hammer toe(s) (acquired), left foot: Secondary | ICD-10-CM

## 2019-08-22 DIAGNOSIS — D225 Melanocytic nevi of trunk: Secondary | ICD-10-CM | POA: Diagnosis not present

## 2019-08-22 DIAGNOSIS — M205X1 Other deformities of toe(s) (acquired), right foot: Secondary | ICD-10-CM

## 2019-08-22 DIAGNOSIS — D1801 Hemangioma of skin and subcutaneous tissue: Secondary | ICD-10-CM | POA: Diagnosis not present

## 2019-08-22 DIAGNOSIS — D692 Other nonthrombocytopenic purpura: Secondary | ICD-10-CM | POA: Diagnosis not present

## 2019-08-22 DIAGNOSIS — D2261 Melanocytic nevi of right upper limb, including shoulder: Secondary | ICD-10-CM | POA: Diagnosis not present

## 2019-08-22 NOTE — Progress Notes (Signed)
He says that he feels that the bones in his forefeet.  He says the bones in his forefeet  are coming through the bottom of his feet when he walks.  This patient states that he is diabetic and is presently wearing diabetic shoes.  He also admits having vascular surgery which has helped to increase his activity especially in his left leg.  Patient has previously been treated and diagnosed with peripheral arterial disease.  He presents the office today for an evaluation of his diabetic feet.  He also says he is concerned about a corn that has developed on the fourth toe right foot.  He says he previously had surgery performed but the corn quickly returned.  He presents the office today for treatment of his corn fourth toe right foot.  Vascular  Dorsalis pedis and posterior tibial pulses are palpable  B/L.  Capillary return  WNL.  Temperature gradient is  WNL.  Skin turgor  WNL  Sensorium  Senn Weinstein monofilament wire  WNL. Normal tactile sensation.  Nail Exam  Patient has normal nails with no evidence of bacterial or fungal infection.  Orthopedic  Exam  Muscle tone and muscle strength  WNL.  No limitations of motion feet  B/L.  No crepitus or joint effusion noted.  DJD 1st MPJ  B/L.  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  Prominent met heads  B/L.    Skin  No open lesions.  Normal skin texture and turgor.  Corn lateral aspect fourth toe right foot.  DJD 1st MPJ  B/L.  Hammer toes with plantarfllexed met heads  B/L.   Corn fourth toe right foot.  IE.  Discussed this patient's forefoot pathology and explained that due to the hammertoes the prominent metatarsal heads feel as they are pushing through the bottom of his feet.  Told him this can be corrected with proper diabetic insoles.  Debrided corn fourth toe right foot and dispensed padding.  RTC 3 months for nail and corn care.  Consider diabetic shoes.   Gardiner Barefoot DPM

## 2019-08-31 ENCOUNTER — Ambulatory Visit: Payer: Medicare HMO | Attending: Internal Medicine

## 2019-08-31 DIAGNOSIS — Z23 Encounter for immunization: Secondary | ICD-10-CM

## 2019-08-31 NOTE — Progress Notes (Signed)
   Covid-19 Vaccination Clinic  Name:  Luke [name]    MRN: [id number] DOB: Mar 24, 1951  08/31/2019  Luke [name] was observed post Covid-19 immunization for 15 minutes without incident. He was provided with Vaccine Information Sheet and instruction to access the V-Safe system.   Luke [name] was instructed to call 911 with any severe reactions post vaccine: Marland Kitchen Difficulty breathing  . Swelling of face and throat  . A fast heartbeat  . A bad rash all over body  . Dizziness and weakness   Immunizations Administered    Name Date Dose VIS Date Route   Pfizer COVID-19 Vaccine 08/31/2019 12:48 PM 0.3 mL 06/01/2019 Intramuscular   Manufacturer: [place]   Lot: [id number]   [place]: [id number]

## 2019-09-24 DIAGNOSIS — F330 Major depressive disorder, recurrent, mild: Secondary | ICD-10-CM | POA: Diagnosis not present

## 2019-09-24 DIAGNOSIS — F630 Pathological gambling: Secondary | ICD-10-CM | POA: Diagnosis not present

## 2019-09-25 ENCOUNTER — Ambulatory Visit: Payer: Medicare HMO | Attending: Internal Medicine

## 2019-09-25 DIAGNOSIS — Z23 Encounter for immunization: Secondary | ICD-10-CM

## 2019-09-25 NOTE — Progress Notes (Signed)
   Covid-19 Vaccination Clinic  Name:  [name]    MRN: [id number] DOB: 1951-03-05  09/25/2019  [name] was observed post Covid-19 immunization for 15 minutes without incident. He was provided with Vaccine Information Sheet and instruction to access the V-Safe system.   [name] was instructed to call 911 with any severe reactions post vaccine: Marland Kitchen Difficulty breathing  . Swelling of face and throat  . A fast heartbeat  . A bad rash all over body  . Dizziness and weakness   Immunizations Administered    Name Date Dose VIS Date Route   Pfizer COVID-19 Vaccine 09/25/2019  9:58 AM 0.3 mL 06/01/2019 Intramuscular   Manufacturer: [place]   Lot: [id number]   [employer]: [id number]

## 2019-10-17 DIAGNOSIS — E1165 Type 2 diabetes mellitus with hyperglycemia: Secondary | ICD-10-CM | POA: Diagnosis not present

## 2019-10-17 DIAGNOSIS — E782 Mixed hyperlipidemia: Secondary | ICD-10-CM | POA: Diagnosis not present

## 2019-10-17 DIAGNOSIS — D509 Iron deficiency anemia, unspecified: Secondary | ICD-10-CM | POA: Diagnosis not present

## 2019-10-17 DIAGNOSIS — D72829 Elevated white blood cell count, unspecified: Secondary | ICD-10-CM | POA: Diagnosis not present

## 2019-10-17 DIAGNOSIS — E119 Type 2 diabetes mellitus without complications: Secondary | ICD-10-CM | POA: Diagnosis not present

## 2019-10-17 DIAGNOSIS — E1169 Type 2 diabetes mellitus with other specified complication: Secondary | ICD-10-CM | POA: Diagnosis not present

## 2019-10-17 DIAGNOSIS — D500 Iron deficiency anemia secondary to blood loss (chronic): Secondary | ICD-10-CM | POA: Diagnosis not present

## 2019-10-17 DIAGNOSIS — Z0000 Encounter for general adult medical examination without abnormal findings: Secondary | ICD-10-CM | POA: Diagnosis not present

## 2019-10-17 DIAGNOSIS — F330 Major depressive disorder, recurrent, mild: Secondary | ICD-10-CM | POA: Diagnosis not present

## 2019-10-23 DIAGNOSIS — Z20828 Contact with and (suspected) exposure to other viral communicable diseases: Secondary | ICD-10-CM | POA: Diagnosis not present

## 2019-10-23 DIAGNOSIS — E1169 Type 2 diabetes mellitus with other specified complication: Secondary | ICD-10-CM | POA: Diagnosis not present

## 2019-10-23 DIAGNOSIS — L408 Other psoriasis: Secondary | ICD-10-CM | POA: Diagnosis not present

## 2019-10-23 DIAGNOSIS — E782 Mixed hyperlipidemia: Secondary | ICD-10-CM | POA: Diagnosis not present

## 2019-10-23 DIAGNOSIS — N400 Enlarged prostate without lower urinary tract symptoms: Secondary | ICD-10-CM | POA: Diagnosis not present

## 2019-10-23 DIAGNOSIS — D500 Iron deficiency anemia secondary to blood loss (chronic): Secondary | ICD-10-CM | POA: Diagnosis not present

## 2019-10-23 DIAGNOSIS — R221 Localized swelling, mass and lump, neck: Secondary | ICD-10-CM | POA: Diagnosis not present

## 2019-10-23 DIAGNOSIS — I739 Peripheral vascular disease, unspecified: Secondary | ICD-10-CM | POA: Diagnosis not present

## 2019-10-23 DIAGNOSIS — Z0001 Encounter for general adult medical examination with abnormal findings: Secondary | ICD-10-CM | POA: Diagnosis not present

## 2019-10-23 DIAGNOSIS — I10 Essential (primary) hypertension: Secondary | ICD-10-CM | POA: Diagnosis not present

## 2019-10-23 DIAGNOSIS — Z87891 Personal history of nicotine dependence: Secondary | ICD-10-CM | POA: Diagnosis not present

## 2019-10-23 DIAGNOSIS — J019 Acute sinusitis, unspecified: Secondary | ICD-10-CM | POA: Diagnosis not present

## 2019-11-21 ENCOUNTER — Other Ambulatory Visit: Payer: Self-pay

## 2019-11-21 ENCOUNTER — Encounter: Payer: Self-pay | Admitting: Podiatry

## 2019-11-21 ENCOUNTER — Ambulatory Visit: Payer: Medicare HMO | Admitting: Podiatry

## 2019-11-21 DIAGNOSIS — B351 Tinea unguium: Secondary | ICD-10-CM | POA: Diagnosis not present

## 2019-11-21 DIAGNOSIS — L84 Corns and callosities: Secondary | ICD-10-CM | POA: Diagnosis not present

## 2019-11-21 DIAGNOSIS — I739 Peripheral vascular disease, unspecified: Secondary | ICD-10-CM | POA: Diagnosis not present

## 2019-11-21 DIAGNOSIS — M79674 Pain in right toe(s): Secondary | ICD-10-CM

## 2019-11-21 DIAGNOSIS — E119 Type 2 diabetes mellitus without complications: Secondary | ICD-10-CM | POA: Insufficient documentation

## 2019-11-21 DIAGNOSIS — M79675 Pain in left toe(s): Secondary | ICD-10-CM | POA: Insufficient documentation

## 2019-11-21 NOTE — Progress Notes (Signed)
This patient returns to my office for at risk foot care.  This patient requires this care by a professional since this patient will be at risk due to having diabetes and PAD.   This patient is unable to cut nails himself since the patient cannot reach his nails.These nails are painful walking and wearing shoes.  He also has painful corn fourth toe right foot. This patient presents for at risk foot care today.  General Appearance  Alert, conversant and in no acute stress.  Vascular  Dorsalis pedis and posterior tibial  pulses are palpable  bilaterally.  Capillary return is within normal limits  bilaterally. Temperature is within normal limits  bilaterally.  Neurologic  Senn-Weinstein monofilament wire test within normal limits  bilaterally. Muscle power within normal limits bilaterally.  Nails Thick disfigured discolored nails with subungual debris  from hallux to fifth toes bilaterally. No evidence of bacterial infection or drainage bilaterally.  Orthopedic  No limitations of motion  feet .  No crepitus or effusions noted.  No bony pathology or digital deformities noted. DJD 1st  MPJ  B/L.  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    Skin  normotropic skin with no porokeratosis noted bilaterally.  No signs of infections or ulcers noted.   Corn lateral aspect fourth toe right foot.  Onychomycosis  Pain in right toes  Pain in left toes Corn fourth toe right foot.  Consent was obtained for treatment procedures.   Mechanical debridement of nails 1-5  bilaterally performed with a nail nipper.  Filed with dremel without incident.  Debride corn with # 15 blade.   Return office visit   3 months                  Told patient to return for periodic foot care and evaluation due to potential at risk complications.   Gardiner Barefoot DPM

## 2019-12-04 DIAGNOSIS — N401 Enlarged prostate with lower urinary tract symptoms: Secondary | ICD-10-CM | POA: Diagnosis not present

## 2019-12-07 DIAGNOSIS — M9903 Segmental and somatic dysfunction of lumbar region: Secondary | ICD-10-CM | POA: Diagnosis not present

## 2019-12-07 DIAGNOSIS — M542 Cervicalgia: Secondary | ICD-10-CM | POA: Diagnosis not present

## 2019-12-07 DIAGNOSIS — M545 Low back pain: Secondary | ICD-10-CM | POA: Diagnosis not present

## 2019-12-07 DIAGNOSIS — M9902 Segmental and somatic dysfunction of thoracic region: Secondary | ICD-10-CM | POA: Diagnosis not present

## 2019-12-07 DIAGNOSIS — M9901 Segmental and somatic dysfunction of cervical region: Secondary | ICD-10-CM | POA: Diagnosis not present

## 2019-12-07 DIAGNOSIS — M546 Pain in thoracic spine: Secondary | ICD-10-CM | POA: Diagnosis not present

## 2019-12-10 DIAGNOSIS — Z794 Long term (current) use of insulin: Secondary | ICD-10-CM | POA: Diagnosis not present

## 2019-12-10 DIAGNOSIS — H25813 Combined forms of age-related cataract, bilateral: Secondary | ICD-10-CM | POA: Diagnosis not present

## 2019-12-10 DIAGNOSIS — E119 Type 2 diabetes mellitus without complications: Secondary | ICD-10-CM | POA: Diagnosis not present

## 2019-12-12 DIAGNOSIS — N401 Enlarged prostate with lower urinary tract symptoms: Secondary | ICD-10-CM | POA: Diagnosis not present

## 2019-12-12 DIAGNOSIS — R3912 Poor urinary stream: Secondary | ICD-10-CM | POA: Diagnosis not present

## 2019-12-19 ENCOUNTER — Other Ambulatory Visit: Payer: Self-pay | Admitting: Cardiovascular Disease

## 2020-01-23 DIAGNOSIS — D500 Iron deficiency anemia secondary to blood loss (chronic): Secondary | ICD-10-CM | POA: Diagnosis not present

## 2020-01-23 DIAGNOSIS — I10 Essential (primary) hypertension: Secondary | ICD-10-CM | POA: Diagnosis not present

## 2020-01-23 DIAGNOSIS — D509 Iron deficiency anemia, unspecified: Secondary | ICD-10-CM | POA: Diagnosis not present

## 2020-01-23 DIAGNOSIS — E782 Mixed hyperlipidemia: Secondary | ICD-10-CM | POA: Diagnosis not present

## 2020-01-23 DIAGNOSIS — E119 Type 2 diabetes mellitus without complications: Secondary | ICD-10-CM | POA: Diagnosis not present

## 2020-01-23 DIAGNOSIS — E1165 Type 2 diabetes mellitus with hyperglycemia: Secondary | ICD-10-CM | POA: Diagnosis not present

## 2020-01-23 DIAGNOSIS — E1169 Type 2 diabetes mellitus with other specified complication: Secondary | ICD-10-CM | POA: Diagnosis not present

## 2020-01-28 DIAGNOSIS — R221 Localized swelling, mass and lump, neck: Secondary | ICD-10-CM | POA: Diagnosis not present

## 2020-01-28 DIAGNOSIS — M546 Pain in thoracic spine: Secondary | ICD-10-CM | POA: Diagnosis not present

## 2020-01-28 DIAGNOSIS — M9902 Segmental and somatic dysfunction of thoracic region: Secondary | ICD-10-CM | POA: Diagnosis not present

## 2020-01-28 DIAGNOSIS — M9903 Segmental and somatic dysfunction of lumbar region: Secondary | ICD-10-CM | POA: Diagnosis not present

## 2020-01-28 DIAGNOSIS — E782 Mixed hyperlipidemia: Secondary | ICD-10-CM | POA: Diagnosis not present

## 2020-01-28 DIAGNOSIS — I10 Essential (primary) hypertension: Secondary | ICD-10-CM | POA: Diagnosis not present

## 2020-01-28 DIAGNOSIS — Z87891 Personal history of nicotine dependence: Secondary | ICD-10-CM | POA: Diagnosis not present

## 2020-01-28 DIAGNOSIS — E1169 Type 2 diabetes mellitus with other specified complication: Secondary | ICD-10-CM | POA: Diagnosis not present

## 2020-01-28 DIAGNOSIS — M545 Low back pain: Secondary | ICD-10-CM | POA: Diagnosis not present

## 2020-01-28 DIAGNOSIS — I739 Peripheral vascular disease, unspecified: Secondary | ICD-10-CM | POA: Diagnosis not present

## 2020-01-28 DIAGNOSIS — M542 Cervicalgia: Secondary | ICD-10-CM | POA: Diagnosis not present

## 2020-01-28 DIAGNOSIS — L408 Other psoriasis: Secondary | ICD-10-CM | POA: Diagnosis not present

## 2020-01-28 DIAGNOSIS — D500 Iron deficiency anemia secondary to blood loss (chronic): Secondary | ICD-10-CM | POA: Diagnosis not present

## 2020-01-28 DIAGNOSIS — M9901 Segmental and somatic dysfunction of cervical region: Secondary | ICD-10-CM | POA: Diagnosis not present

## 2020-01-28 DIAGNOSIS — N400 Enlarged prostate without lower urinary tract symptoms: Secondary | ICD-10-CM | POA: Diagnosis not present

## 2020-01-29 ENCOUNTER — Other Ambulatory Visit: Payer: Self-pay

## 2020-01-29 ENCOUNTER — Ambulatory Visit: Payer: Medicare HMO | Admitting: Cardiovascular Disease

## 2020-01-29 ENCOUNTER — Encounter: Payer: Self-pay | Admitting: Cardiovascular Disease

## 2020-01-29 DIAGNOSIS — E782 Mixed hyperlipidemia: Secondary | ICD-10-CM | POA: Diagnosis not present

## 2020-01-29 DIAGNOSIS — I2583 Coronary atherosclerosis due to lipid rich plaque: Secondary | ICD-10-CM | POA: Diagnosis not present

## 2020-01-29 DIAGNOSIS — I739 Peripheral vascular disease, unspecified: Secondary | ICD-10-CM | POA: Diagnosis not present

## 2020-01-29 DIAGNOSIS — I10 Essential (primary) hypertension: Secondary | ICD-10-CM | POA: Diagnosis not present

## 2020-01-29 DIAGNOSIS — I2510 Atherosclerotic heart disease of native coronary artery without angina pectoris: Secondary | ICD-10-CM | POA: Diagnosis not present

## 2020-01-29 NOTE — Assessment & Plan Note (Signed)
History of peripheral arterial disease status post orbital atherectomy, PTA and covered stenting of a 99% calcified ostial/proximal left common iliac artery stenosis 12/19/2017.  He did have a 50% right common iliac artery stenosis with a 15 to 20 mm gradient.  He is very active and plays golf and has essentially no claudication.  Recent Doppler studies performed 07/31/2019 revealed a right ABI of 1.05 and a left of 0.75.  His iliac stent appeared patent and did have moderate right iliac disease.  We will continue to follow him a duplex ultrasound.

## 2020-01-29 NOTE — Progress Notes (Signed)
01/29/2020 Luke [name]   05-Aug-1950  [id number]  Primary Physician Celene Squibb, MD Primary Cardiologist: Lorretta Harp MD Garret Reddish, [place], [place]  HPI:  Luke [name] is a 69 y.o.  thin appearing married Caucasian male father of 2, grandfather of 43 grandchildrenwho I last saw in the office  01/26/2019.Apparently I took care of her father 15 years ago, Luke [name].He was referred by Beckie Busing, NP for peripheral vascular evaluation because of lifestyle limiting claudication. His risk factors include greater than 100 pack years of tobacco abuse having quit 1 year ago, treated hypertension, diabetes and hyperlipidemia. He is never had a heart attack or stroke. He denies chest pain or shortness of breath. He does have documented coronary disease by Dr. Claiborne Billings years ago but was never intervened on. He is complained of lower extremity claudication for the last 4 years which has gone undiagnosed and treated as a "orthopedic issue". Recent Dopplers performed in our office 12/12/2017 revealed a right ABI of 0.90 and left 0.51 with high-frequency signals in both iliac arteries. I suspect this left common iliac is either occluded or or subtotally occluded. He wishes to proceed with angiography and intervention.  I performed peripheral angiography on him 12/19/2017 revealing a 91% calcified ostial left common iliac artery stenosis and a 50% right. I performed Dynabac orbital rotational atherectomy, PTA and covered stenting on him with an excellent angiographic and clinical result. Follow-up Dopplers performed subsequent to that revealed increase in his left ABI from 0.51 up to 0.85. His claudication has completely resolved.  Since I saw him in the office a year ago he is done well.    He denies chest pain or shortness of breath.  He did have a twinge of left calf chronic claudication after walking 20 half miles but for the most part he denies claudication.  He plays golf 3 days a week without  limitation.  Recent Doppler studies performed 07/31/2019 revealed stable ABIs with a patent left iliac stent and moderate right iliac disease.   Current Meds  Medication Sig   amLODipine (NORVASC) 10 MG tablet TAKE 1 TABLET EVERY DAY   aspirin 81 MG tablet Take 1 tablet (81 mg total) by mouth daily. HOLD FOR 1 WEEK, THEN RESUME IF NO BLEEDING PROBLEMS   atorvastatin (LIPITOR) 80 MG tablet TAKE 1 TABLET EVERY DAY   empagliflozin (JARDIANCE) 25 MG TABS tablet Take 25 mg by mouth daily.    ferrous sulfate 325 (65 FE) MG EC tablet Take 1 tablet (325 mg total) by mouth 2 (two) times daily after a meal. (Patient taking differently: Take 325 mg by mouth daily with breakfast. )   finasteride (PROSCAR) 5 MG tablet Take 5 mg by mouth daily.   Insulin Degludec-Liraglutide (XULTOPHY) 100-3.6 UNIT-MG/ML SOPN Inject 24 Units into the skin daily.    Loratadine (CLARITIN PO) Take by mouth daily.   metFORMIN (GLUCOPHAGE) 500 MG tablet    metFORMIN (GLUCOPHAGE-XR) 500 MG 24 hr tablet    pantoprazole (PROTONIX) 40 MG tablet TAKE 1 TABLET EVERY DAY   ramipril (ALTACE) 10 MG capsule TAKE 1 CAPSULE EVERY DAY   sertraline (ZOLOFT) 25 MG tablet Take 25 mg by mouth daily.   tamsulosin (FLOMAX) 0.4 MG CAPS capsule Take 0.4 mg by mouth.     Allergies  Allergen Reactions   Morphine And Related Itching    Social History   Socioeconomic History   Marital status: Married    Spouse name: Not on file  Number of children: Not on file   Years of education: Not on file   Highest education level: Not on file  Occupational History   Not on file  Tobacco Use   Smoking status: Former Smoker    Packs/day: 2.00    Years: 50.00    Pack years: 100.00    Types: Cigarettes    Quit date: 09/28/2014    Years since quitting: 5.3   Smokeless tobacco: Never Used  Vaping Use   Vaping Use: Never used  Substance and Sexual Activity   Alcohol use: Never    Alcohol/week: 0.0 standard drinks   Drug  use: Never   Sexual activity: Not Currently  Other Topics Concern   Not on file  Social History Narrative   Not on file   Social Determinants of Health   Financial Resource Strain:    Difficulty of Paying Living Expenses:   Food Insecurity:    Worried About Charity fundraiser in the Last Year:    Arboriculturist in the Last Year:   Transportation Needs:    Film/video editor (Medical):    Lack of Transportation (Non-Medical):   Physical Activity:    Days of Exercise per Week:    Minutes of Exercise per Session:   Stress:    Feeling of Stress :   Social Connections:    Frequency of Communication with Friends and Family:    Frequency of Social Gatherings with Friends and Family:    Attends Religious Services:    Active Member of Clubs or Organizations:    Attends Music therapist:    Marital Status:   Intimate Partner Violence:    Fear of Current or Ex-Partner:    Emotionally Abused:    Physically Abused:    Sexually Abused:      Review of Systems: General: negative for chills, fever, night sweats or weight changes.  Cardiovascular: negative for chest pain, dyspnea on exertion, edema, orthopnea, palpitations, paroxysmal nocturnal dyspnea or shortness of breath Dermatological: negative for rash Respiratory: negative for cough or wheezing Urologic: negative for hematuria Abdominal: negative for nausea, vomiting, diarrhea, bright red blood per rectum, melena, or hematemesis Neurologic: negative for visual changes, syncope, or dizziness All other systems reviewed and are otherwise negative except as noted above.    Blood pressure 134/74, pulse 71, height 5\' 5"  (1.651 m), weight 168 lb (76.2 kg), SpO2 95 %.  General appearance: alert and no distress Neck: no adenopathy, no carotid bruit, no JVD, supple, symmetrical, trachea midline and thyroid not enlarged, symmetric, no tenderness/mass/nodules Lungs: clear to auscultation  bilaterally Heart: regular rate and rhythm, S1, S2 normal, no murmur, click, rub or gallop Extremities: extremities normal, atraumatic, no cyanosis or edema Pulses: 2+ and symmetric Skin: Skin color, texture, turgor normal. No rashes or lesions Neurologic: Alert and oriented X 3, normal strength and tone. Normal symmetric reflexes. Normal coordination and gait  EKG sinus rhythm at 71 without ST or T wave changes.  I personally reviewed this EKG.  ASSESSMENT AND PLAN:   HTN (hypertension) History of essential hypertension a blood pressure measured today 134/74.  He is on amlodipine and ramipril.  Hyperlipemia History of hyperlipidemia on statin therapy with lipid profile performed 01/23/2020 revealing a total cholesterol 106, LDL 54 and HDL of 37.  CAD (coronary artery disease) History of CAD with documented coronary artery disease by cath performed by Dr. Claiborne Billings years ago but this was never intervened on.  He is completely  asymptomatic.  Peripheral arterial disease (HCC) History of peripheral arterial disease status post orbital atherectomy, PTA and covered stenting of a 99% calcified ostial/proximal left common iliac artery stenosis 12/19/2017.  He did have a 50% right common iliac artery stenosis with a 15 to 20 mm gradient.  He is very active and plays golf and has essentially no claudication.  Recent Doppler studies performed 07/31/2019 revealed a right ABI of 1.05 and a left of 0.75.  His iliac stent appeared patent and did have moderate right iliac disease.  We will continue to follow him a duplex ultrasound.      Lorretta Harp MD FACP,FACC,FAHA, [facility] 01/29/2020 2:35 PM

## 2020-01-29 NOTE — Assessment & Plan Note (Signed)
History of essential hypertension a blood pressure measured today 134/74.  He is on amlodipine and ramipril.

## 2020-01-29 NOTE — Patient Instructions (Signed)
Medication Instructions:  Your physician recommends that you continue on your current medications as directed. Please refer to the Current Medication list given to you today.  *If you need a refill on your cardiac medications before your next appointment, please call your pharmacy*  Testing/Procedures: Your physician has requested that you have an ankle brachial index (ABI) in February 2022. During this test an ultrasound and blood pressure cuff are used to evaluate the arteries that supply the arms and legs with blood. Allow thirty minutes for this exam. There are no restrictions or special instructions.  Your physician has requested that you have an aorto-iliac duplex in February 2022. During this test, an ultrasound is used to evaluate the aorta and iliac arteries. Do not eat after midnight the day before and avoid carbonated beverages  Follow-Up: At [facility], you and your health needs are our priority.  As part of our continuing mission to provide you with exceptional heart care, we have created designated Provider Care Teams.  These Care Teams include your primary Cardiologist (physician) and Advanced Practice Providers (APPs -  Physician Assistants and Nurse Practitioners) who all work together to provide you with the care you need, when you need it.  We recommend signing up for the patient portal called "MyChart".  Sign up information is provided on this After Visit Summary.  MyChart is used to connect with patients for Virtual Visits (Telemedicine).  Patients are able to view lab/test results, encounter notes, upcoming appointments, etc.  Non-urgent messages can be sent to your provider as well.   To learn more about what you can do with MyChart, go to [email or web address].    Your next appointment:   12 month(s)  The format for your next appointment:   In Person  Provider:   Quay Burow, MD

## 2020-01-29 NOTE — Assessment & Plan Note (Signed)
History of hyperlipidemia on statin therapy with lipid profile performed 01/23/2020 revealing a total cholesterol 106, LDL 54 and HDL of 37.

## 2020-01-29 NOTE — Assessment & Plan Note (Signed)
History of CAD with documented coronary artery disease by cath performed by Dr. Claiborne Billings years ago but this was never intervened on.  He is completely asymptomatic.

## 2020-02-15 ENCOUNTER — Other Ambulatory Visit: Payer: Self-pay | Admitting: Cardiovascular Disease

## 2020-02-22 DIAGNOSIS — Z720 Tobacco use: Secondary | ICD-10-CM | POA: Diagnosis not present

## 2020-02-22 DIAGNOSIS — E1169 Type 2 diabetes mellitus with other specified complication: Secondary | ICD-10-CM | POA: Diagnosis not present

## 2020-02-22 DIAGNOSIS — Z6829 Body mass index (BMI) 29.0-29.9, adult: Secondary | ICD-10-CM | POA: Diagnosis not present

## 2020-02-22 DIAGNOSIS — N400 Enlarged prostate without lower urinary tract symptoms: Secondary | ICD-10-CM | POA: Diagnosis not present

## 2020-02-22 DIAGNOSIS — I10 Essential (primary) hypertension: Secondary | ICD-10-CM | POA: Diagnosis not present

## 2020-02-22 DIAGNOSIS — Z6828 Body mass index (BMI) 28.0-28.9, adult: Secondary | ICD-10-CM | POA: Diagnosis not present

## 2020-02-22 DIAGNOSIS — Z0000 Encounter for general adult medical examination without abnormal findings: Secondary | ICD-10-CM | POA: Diagnosis not present

## 2020-02-22 DIAGNOSIS — J309 Allergic rhinitis, unspecified: Secondary | ICD-10-CM | POA: Diagnosis not present

## 2020-02-22 DIAGNOSIS — Z0001 Encounter for general adult medical examination with abnormal findings: Secondary | ICD-10-CM | POA: Diagnosis not present

## 2020-02-26 ENCOUNTER — Encounter: Payer: Self-pay | Admitting: Podiatry

## 2020-02-26 ENCOUNTER — Other Ambulatory Visit: Payer: Self-pay

## 2020-02-26 ENCOUNTER — Ambulatory Visit: Payer: Medicare HMO | Admitting: Podiatry

## 2020-02-26 DIAGNOSIS — M79674 Pain in right toe(s): Secondary | ICD-10-CM

## 2020-02-26 DIAGNOSIS — I739 Peripheral vascular disease, unspecified: Secondary | ICD-10-CM

## 2020-02-26 DIAGNOSIS — L84 Corns and callosities: Secondary | ICD-10-CM | POA: Diagnosis not present

## 2020-02-26 DIAGNOSIS — E782 Mixed hyperlipidemia: Secondary | ICD-10-CM | POA: Diagnosis not present

## 2020-02-26 DIAGNOSIS — R221 Localized swelling, mass and lump, neck: Secondary | ICD-10-CM | POA: Diagnosis not present

## 2020-02-26 DIAGNOSIS — E119 Type 2 diabetes mellitus without complications: Secondary | ICD-10-CM

## 2020-02-26 DIAGNOSIS — I10 Essential (primary) hypertension: Secondary | ICD-10-CM | POA: Diagnosis not present

## 2020-02-26 DIAGNOSIS — L408 Other psoriasis: Secondary | ICD-10-CM | POA: Diagnosis not present

## 2020-02-26 DIAGNOSIS — B351 Tinea unguium: Secondary | ICD-10-CM

## 2020-02-26 DIAGNOSIS — Z87891 Personal history of nicotine dependence: Secondary | ICD-10-CM | POA: Diagnosis not present

## 2020-02-26 DIAGNOSIS — M79675 Pain in left toe(s): Secondary | ICD-10-CM

## 2020-02-26 DIAGNOSIS — D500 Iron deficiency anemia secondary to blood loss (chronic): Secondary | ICD-10-CM | POA: Diagnosis not present

## 2020-02-26 DIAGNOSIS — N400 Enlarged prostate without lower urinary tract symptoms: Secondary | ICD-10-CM | POA: Diagnosis not present

## 2020-02-26 DIAGNOSIS — E1169 Type 2 diabetes mellitus with other specified complication: Secondary | ICD-10-CM | POA: Diagnosis not present

## 2020-02-26 NOTE — Progress Notes (Signed)
This patient returns to my office for at risk foot care.  This patient requires this care by a professional since this patient will be at risk due to having diabetes and PAD.   This patient is unable to cut nails himself since the patient cannot reach his nails.These nails are painful walking and wearing shoes.  He also has painful corn fourth toe right foot. This patient presents for at risk foot care today.  General Appearance  Alert, conversant and in no acute stress.  Vascular  Dorsalis pedis and posterior tibial  pulses are palpable  bilaterally.  Capillary return is within normal limits  bilaterally. Temperature is within normal limits  bilaterally.  Neurologic  Senn-Weinstein monofilament wire test within normal limits  bilaterally. Muscle power within normal limits bilaterally.  Nails Thick disfigured discolored nails with subungual debris  from hallux to fifth toes bilaterally. No evidence of bacterial infection or drainage bilaterally.  Orthopedic  No limitations of motion  feet .  No crepitus or effusions noted.  No bony pathology or digital deformities noted. DJD 1st  MPJ  B/L.  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    Skin  normotropic skin with no porokeratosis noted bilaterally.  No signs of infections or ulcers noted.   Corn lateral aspect fourth toe right foot.  Onychomycosis  Pain in right toes  Pain in left toes Corn fourth toe right foot.  Consent was obtained for treatment procedures.   Mechanical debridement of nails 1-5  bilaterally performed with a nail nipper.  Filed with dremel without incident.  Debride corn with # 15 blade.   Return office visit   10 weeks                  Told patient to return for periodic foot care and evaluation due to potential at risk complications.   Hailie Searight DPM  

## 2020-03-03 DIAGNOSIS — H10503 Unspecified blepharoconjunctivitis, bilateral: Secondary | ICD-10-CM | POA: Diagnosis not present

## 2020-03-03 DIAGNOSIS — H25813 Combined forms of age-related cataract, bilateral: Secondary | ICD-10-CM | POA: Diagnosis not present

## 2020-03-10 DIAGNOSIS — Z0001 Encounter for general adult medical examination with abnormal findings: Secondary | ICD-10-CM | POA: Diagnosis not present

## 2020-03-10 DIAGNOSIS — Z0000 Encounter for general adult medical examination without abnormal findings: Secondary | ICD-10-CM | POA: Diagnosis not present

## 2020-03-10 DIAGNOSIS — D72829 Elevated white blood cell count, unspecified: Secondary | ICD-10-CM | POA: Diagnosis not present

## 2020-03-10 DIAGNOSIS — Z713 Dietary counseling and surveillance: Secondary | ICD-10-CM | POA: Diagnosis not present

## 2020-03-10 DIAGNOSIS — R5383 Other fatigue: Secondary | ICD-10-CM | POA: Diagnosis not present

## 2020-03-10 DIAGNOSIS — R221 Localized swelling, mass and lump, neck: Secondary | ICD-10-CM | POA: Diagnosis not present

## 2020-03-10 DIAGNOSIS — G4730 Sleep apnea, unspecified: Secondary | ICD-10-CM | POA: Diagnosis not present

## 2020-03-10 DIAGNOSIS — R202 Paresthesia of skin: Secondary | ICD-10-CM | POA: Diagnosis not present

## 2020-03-10 DIAGNOSIS — D509 Iron deficiency anemia, unspecified: Secondary | ICD-10-CM | POA: Diagnosis not present

## 2020-03-10 DIAGNOSIS — R05 Cough: Secondary | ICD-10-CM | POA: Diagnosis not present

## 2020-03-10 DIAGNOSIS — E1169 Type 2 diabetes mellitus with other specified complication: Secondary | ICD-10-CM | POA: Diagnosis not present

## 2020-03-10 DIAGNOSIS — J309 Allergic rhinitis, unspecified: Secondary | ICD-10-CM | POA: Diagnosis not present

## 2020-03-10 DIAGNOSIS — Z23 Encounter for immunization: Secondary | ICD-10-CM | POA: Diagnosis not present

## 2020-03-10 DIAGNOSIS — M545 Low back pain: Secondary | ICD-10-CM | POA: Diagnosis not present

## 2020-03-12 DIAGNOSIS — H10503 Unspecified blepharoconjunctivitis, bilateral: Secondary | ICD-10-CM | POA: Diagnosis not present

## 2020-03-12 DIAGNOSIS — H1045 Other chronic allergic conjunctivitis: Secondary | ICD-10-CM | POA: Diagnosis not present

## 2020-03-12 DIAGNOSIS — H25813 Combined forms of age-related cataract, bilateral: Secondary | ICD-10-CM | POA: Diagnosis not present

## 2020-03-12 DIAGNOSIS — Z794 Long term (current) use of insulin: Secondary | ICD-10-CM | POA: Diagnosis not present

## 2020-03-12 DIAGNOSIS — E119 Type 2 diabetes mellitus without complications: Secondary | ICD-10-CM | POA: Diagnosis not present

## 2020-03-14 DIAGNOSIS — D519 Vitamin B12 deficiency anemia, unspecified: Secondary | ICD-10-CM | POA: Diagnosis not present

## 2020-03-15 ENCOUNTER — Other Ambulatory Visit: Payer: Self-pay | Admitting: Cardiovascular Disease

## 2020-03-21 DIAGNOSIS — Z23 Encounter for immunization: Secondary | ICD-10-CM | POA: Diagnosis not present

## 2020-03-28 ENCOUNTER — Other Ambulatory Visit: Payer: Self-pay | Admitting: Cardiovascular Disease

## 2020-03-28 DIAGNOSIS — D519 Vitamin B12 deficiency anemia, unspecified: Secondary | ICD-10-CM | POA: Diagnosis not present

## 2020-04-28 DIAGNOSIS — Z0001 Encounter for general adult medical examination with abnormal findings: Secondary | ICD-10-CM | POA: Diagnosis not present

## 2020-04-28 DIAGNOSIS — G4730 Sleep apnea, unspecified: Secondary | ICD-10-CM | POA: Diagnosis not present

## 2020-04-28 DIAGNOSIS — D519 Vitamin B12 deficiency anemia, unspecified: Secondary | ICD-10-CM | POA: Diagnosis not present

## 2020-04-28 DIAGNOSIS — Z713 Dietary counseling and surveillance: Secondary | ICD-10-CM | POA: Diagnosis not present

## 2020-04-28 DIAGNOSIS — R221 Localized swelling, mass and lump, neck: Secondary | ICD-10-CM | POA: Diagnosis not present

## 2020-04-28 DIAGNOSIS — Z0000 Encounter for general adult medical examination without abnormal findings: Secondary | ICD-10-CM | POA: Diagnosis not present

## 2020-04-28 DIAGNOSIS — R5383 Other fatigue: Secondary | ICD-10-CM | POA: Diagnosis not present

## 2020-04-28 DIAGNOSIS — Z23 Encounter for immunization: Secondary | ICD-10-CM | POA: Diagnosis not present

## 2020-04-28 DIAGNOSIS — J309 Allergic rhinitis, unspecified: Secondary | ICD-10-CM | POA: Diagnosis not present

## 2020-04-30 DIAGNOSIS — E782 Mixed hyperlipidemia: Secondary | ICD-10-CM | POA: Diagnosis not present

## 2020-04-30 DIAGNOSIS — I739 Peripheral vascular disease, unspecified: Secondary | ICD-10-CM | POA: Diagnosis not present

## 2020-04-30 DIAGNOSIS — L408 Other psoriasis: Secondary | ICD-10-CM | POA: Diagnosis not present

## 2020-04-30 DIAGNOSIS — N400 Enlarged prostate without lower urinary tract symptoms: Secondary | ICD-10-CM | POA: Diagnosis not present

## 2020-04-30 DIAGNOSIS — R221 Localized swelling, mass and lump, neck: Secondary | ICD-10-CM | POA: Diagnosis not present

## 2020-04-30 DIAGNOSIS — I10 Essential (primary) hypertension: Secondary | ICD-10-CM | POA: Diagnosis not present

## 2020-04-30 DIAGNOSIS — D500 Iron deficiency anemia secondary to blood loss (chronic): Secondary | ICD-10-CM | POA: Diagnosis not present

## 2020-04-30 DIAGNOSIS — Z87891 Personal history of nicotine dependence: Secondary | ICD-10-CM | POA: Diagnosis not present

## 2020-04-30 DIAGNOSIS — E1169 Type 2 diabetes mellitus with other specified complication: Secondary | ICD-10-CM | POA: Diagnosis not present

## 2020-05-05 DIAGNOSIS — D519 Vitamin B12 deficiency anemia, unspecified: Secondary | ICD-10-CM | POA: Diagnosis not present

## 2020-05-09 ENCOUNTER — Other Ambulatory Visit: Payer: Self-pay

## 2020-05-09 ENCOUNTER — Ambulatory Visit: Payer: Medicare HMO | Admitting: Podiatry

## 2020-05-09 ENCOUNTER — Encounter: Payer: Self-pay | Admitting: Podiatry

## 2020-05-09 DIAGNOSIS — M79674 Pain in right toe(s): Secondary | ICD-10-CM

## 2020-05-09 DIAGNOSIS — M79675 Pain in left toe(s): Secondary | ICD-10-CM | POA: Diagnosis not present

## 2020-05-09 DIAGNOSIS — I739 Peripheral vascular disease, unspecified: Secondary | ICD-10-CM

## 2020-05-09 DIAGNOSIS — E119 Type 2 diabetes mellitus without complications: Secondary | ICD-10-CM

## 2020-05-09 DIAGNOSIS — L84 Corns and callosities: Secondary | ICD-10-CM

## 2020-05-09 DIAGNOSIS — B351 Tinea unguium: Secondary | ICD-10-CM

## 2020-05-09 NOTE — Progress Notes (Signed)
This patient returns to my office for at risk foot care.  This patient requires this care by a professional since this patient will be at risk due to having diabetes and PAD.   This patient is unable to cut nails himself since the patient cannot reach his nails.These nails are painful walking and wearing shoes.  He also has painful corn fourth toe right foot. This patient presents for at risk foot care today.  General Appearance  Alert, conversant and in no acute stress.  Vascular  Dorsalis pedis and posterior tibial  pulses are palpable  bilaterally.  Capillary return is within normal limits  bilaterally. Temperature is within normal limits  bilaterally.  Neurologic  Senn-Weinstein monofilament wire test within normal limits  bilaterally. Muscle power within normal limits bilaterally.  Nails Thick disfigured discolored nails with subungual debris  from hallux to fifth toes bilaterally. No evidence of bacterial infection or drainage bilaterally.  Orthopedic  No limitations of motion  feet .  No crepitus or effusions noted.  No bony pathology or digital deformities noted. DJD 1st  MPJ  B/L.  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    Skin  normotropic skin with no porokeratosis noted bilaterally.  No signs of infections or ulcers noted.   Corn lateral aspect fourth toe right foot.  Onychomycosis  Pain in right toes  Pain in left toes Corn fourth toe right foot.  Consent was obtained for treatment procedures.   Mechanical debridement of nails 1-5  bilaterally performed with a nail nipper.  Filed with dremel without incident.  Debride corn with # 15 blade.   Return office visit   10 weeks                  Told patient to return for periodic foot care and evaluation due to potential at risk complications.   Gardiner Barefoot DPM

## 2020-05-23 ENCOUNTER — Ambulatory Visit: Payer: Medicare HMO | Attending: Internal Medicine

## 2020-05-23 DIAGNOSIS — Z23 Encounter for immunization: Secondary | ICD-10-CM

## 2020-05-23 NOTE — Progress Notes (Signed)
° °  Covid-19 Vaccination Clinic  Name:  Luke [name]    MRN: [id number] DOB: Mar 22, 1951  05/23/2020  Mr. Garraway was observed post Covid-19 immunization for 15 minutes without incident. He was provided with Vaccine Information Sheet and instruction to access the V-Safe system.   Mr. Palinkas was instructed to call 911 with any severe reactions post vaccine:  Difficulty breathing   Swelling of face and throat   A fast heartbeat   A bad rash all over body   Dizziness and weakness   Immunizations Administered    Name Date Dose VIS Date Route   Pfizer COVID-19 Vaccine 05/23/2020  1:54 PM 0.3 mL 04/09/2020 Intramuscular   Manufacturer: [place]   Lot: [id number]   NDC: [address]-9

## 2020-05-30 DIAGNOSIS — D519 Vitamin B12 deficiency anemia, unspecified: Secondary | ICD-10-CM | POA: Diagnosis not present

## 2020-06-30 DIAGNOSIS — D519 Vitamin B12 deficiency anemia, unspecified: Secondary | ICD-10-CM | POA: Diagnosis not present

## 2020-07-15 ENCOUNTER — Ambulatory Visit: Payer: Medicare HMO | Admitting: Podiatry

## 2020-07-15 ENCOUNTER — Other Ambulatory Visit: Payer: Self-pay

## 2020-07-15 ENCOUNTER — Ambulatory Visit: Payer: Medicare HMO | Admitting: Orthotics

## 2020-07-15 ENCOUNTER — Encounter: Payer: Self-pay | Admitting: Podiatry

## 2020-07-15 DIAGNOSIS — M79675 Pain in left toe(s): Secondary | ICD-10-CM | POA: Diagnosis not present

## 2020-07-15 DIAGNOSIS — M205X2 Other deformities of toe(s) (acquired), left foot: Secondary | ICD-10-CM

## 2020-07-15 DIAGNOSIS — L84 Corns and callosities: Secondary | ICD-10-CM | POA: Diagnosis not present

## 2020-07-15 DIAGNOSIS — E119 Type 2 diabetes mellitus without complications: Secondary | ICD-10-CM | POA: Diagnosis not present

## 2020-07-15 DIAGNOSIS — B351 Tinea unguium: Secondary | ICD-10-CM

## 2020-07-15 DIAGNOSIS — I739 Peripheral vascular disease, unspecified: Secondary | ICD-10-CM

## 2020-07-15 DIAGNOSIS — M79674 Pain in right toe(s): Secondary | ICD-10-CM | POA: Diagnosis not present

## 2020-07-15 DIAGNOSIS — M205X1 Other deformities of toe(s) (acquired), right foot: Secondary | ICD-10-CM

## 2020-07-15 NOTE — Progress Notes (Signed)

## 2020-07-15 NOTE — Progress Notes (Signed)
This patient returns to my office for at risk foot care.  This patient requires this care by a professional since this patient will be at risk due to having diabetes and PAD.   This patient is unable to cut nails himself since the patient cannot reach his nails.These nails are painful walking and wearing shoes.  He also has painful corn fourth toe right foot. This patient presents for at risk foot care today.  General Appearance  Alert, conversant and in no acute stress.  Vascular  Dorsalis pedis and posterior tibial  pulses are palpable  bilaterally.  Capillary return is within normal limits  bilaterally. Temperature is within normal limits  bilaterally.  Neurologic  Senn-Weinstein monofilament wire test within normal limits  bilaterally. Muscle power within normal limits bilaterally.  Nails Thick disfigured discolored nails with subungual debris  from hallux to fifth toes bilaterally. No evidence of bacterial infection or drainage bilaterally.  Orthopedic  No limitations of motion  feet .  No crepitus or effusions noted.  No bony pathology or digital deformities noted. DJD 1st  MPJ  B/L.  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    Skin  normotropic skin with no porokeratosis noted bilaterally.  No signs of infections or ulcers noted.   Corn lateral aspect fourth toe right foot.  Asymptomatic porokeratosis sub 4th left  Onychomycosis  Pain in right toes  Pain in left toes Corn fourth toe right foot.  Consent was obtained for treatment procedures.   Mechanical debridement of nails 1-5  bilaterally performed with a nail nipper.  Filed with dremel without incident.  Debride corn with # 15 blade.   Return office visit   10 weeks                  Told patient to return for periodic foot care and evaluation due to potential at risk complications.   Gardiner Barefoot DPM

## 2020-07-22 ENCOUNTER — Other Ambulatory Visit: Payer: Self-pay | Admitting: Cardiovascular Disease

## 2020-07-22 ENCOUNTER — Other Ambulatory Visit: Payer: Self-pay

## 2020-07-22 ENCOUNTER — Ambulatory Visit (HOSPITAL_COMMUNITY)
Admission: RE | Admit: 2020-07-22 | Discharge: 2020-07-22 | Disposition: A | Discharge status: Home / Self Care | Payer: Medicare HMO | Source: Ambulatory Visit | Attending: Cardiovascular Disease | Admitting: Cardiovascular Disease

## 2020-07-22 ENCOUNTER — Ambulatory Visit (HOSPITAL_BASED_OUTPATIENT_CLINIC_OR_DEPARTMENT_OTHER)
Admission: RE | Admit: 2020-07-22 | Discharge: 2020-07-22 | Disposition: A | Discharge status: Home / Self Care | Payer: Medicare HMO | Source: Ambulatory Visit | Attending: Cardiovascular Disease | Admitting: Cardiovascular Disease

## 2020-07-22 DIAGNOSIS — I739 Peripheral vascular disease, unspecified: Secondary | ICD-10-CM

## 2020-07-22 DIAGNOSIS — Z95828 Presence of other vascular implants and grafts: Secondary | ICD-10-CM

## 2020-08-01 DIAGNOSIS — E1165 Type 2 diabetes mellitus with hyperglycemia: Secondary | ICD-10-CM | POA: Diagnosis not present

## 2020-08-01 DIAGNOSIS — Z23 Encounter for immunization: Secondary | ICD-10-CM | POA: Diagnosis not present

## 2020-08-01 DIAGNOSIS — I10 Essential (primary) hypertension: Secondary | ICD-10-CM | POA: Diagnosis not present

## 2020-08-01 DIAGNOSIS — Z0000 Encounter for general adult medical examination without abnormal findings: Secondary | ICD-10-CM | POA: Diagnosis not present

## 2020-08-01 DIAGNOSIS — Z713 Dietary counseling and surveillance: Secondary | ICD-10-CM | POA: Diagnosis not present

## 2020-08-01 DIAGNOSIS — G4730 Sleep apnea, unspecified: Secondary | ICD-10-CM | POA: Diagnosis not present

## 2020-08-01 DIAGNOSIS — Z0001 Encounter for general adult medical examination with abnormal findings: Secondary | ICD-10-CM | POA: Diagnosis not present

## 2020-08-01 DIAGNOSIS — R5383 Other fatigue: Secondary | ICD-10-CM | POA: Diagnosis not present

## 2020-08-01 DIAGNOSIS — E782 Mixed hyperlipidemia: Secondary | ICD-10-CM | POA: Diagnosis not present

## 2020-08-01 DIAGNOSIS — J309 Allergic rhinitis, unspecified: Secondary | ICD-10-CM | POA: Diagnosis not present

## 2020-08-01 DIAGNOSIS — D519 Vitamin B12 deficiency anemia, unspecified: Secondary | ICD-10-CM | POA: Diagnosis not present

## 2020-08-01 DIAGNOSIS — R221 Localized swelling, mass and lump, neck: Secondary | ICD-10-CM | POA: Diagnosis not present

## 2020-08-06 DIAGNOSIS — E1169 Type 2 diabetes mellitus with other specified complication: Secondary | ICD-10-CM | POA: Diagnosis not present

## 2020-08-06 DIAGNOSIS — L408 Other psoriasis: Secondary | ICD-10-CM | POA: Diagnosis not present

## 2020-08-06 DIAGNOSIS — R221 Localized swelling, mass and lump, neck: Secondary | ICD-10-CM | POA: Diagnosis not present

## 2020-08-06 DIAGNOSIS — D500 Iron deficiency anemia secondary to blood loss (chronic): Secondary | ICD-10-CM | POA: Diagnosis not present

## 2020-08-06 DIAGNOSIS — I10 Essential (primary) hypertension: Secondary | ICD-10-CM | POA: Diagnosis not present

## 2020-08-06 DIAGNOSIS — I739 Peripheral vascular disease, unspecified: Secondary | ICD-10-CM | POA: Diagnosis not present

## 2020-08-06 DIAGNOSIS — E782 Mixed hyperlipidemia: Secondary | ICD-10-CM | POA: Diagnosis not present

## 2020-08-06 DIAGNOSIS — N400 Enlarged prostate without lower urinary tract symptoms: Secondary | ICD-10-CM | POA: Diagnosis not present

## 2020-08-06 DIAGNOSIS — Z87891 Personal history of nicotine dependence: Secondary | ICD-10-CM | POA: Diagnosis not present

## 2020-08-11 ENCOUNTER — Other Ambulatory Visit: Payer: Self-pay

## 2020-08-11 ENCOUNTER — Encounter (INDEPENDENT_AMBULATORY_CARE_PROVIDER_SITE_OTHER): Payer: Self-pay | Admitting: Otolaryngology

## 2020-08-11 ENCOUNTER — Ambulatory Visit (INDEPENDENT_AMBULATORY_CARE_PROVIDER_SITE_OTHER): Payer: Medicare HMO | Admitting: Otolaryngology

## 2020-08-11 VITALS — Temp 97.3°F

## 2020-08-11 DIAGNOSIS — D170 Benign lipomatous neoplasm of skin and subcutaneous tissue of head, face and neck: Secondary | ICD-10-CM

## 2020-08-11 NOTE — Progress Notes (Signed)
HPI: Luke [name] is a 70 y.o. male who presents is referred by Dr. [name] for evaluation of a left neck nodule that he has had for about 6 years now.  Over the past year it has gradually gotten larger.  But is otherwise asymptomatic.  He has no hoarseness.  He occasionally feels like food gets caught in his throat about this region.  He quit smoking about 5 years ago.  Denies any sore throat.  Past Medical History:  Diagnosis Date  . CAD (coronary artery disease) 10/02/2010   nuclear study show normal perfusion on medical therapy without scar or ischemia. EF 64%  . GERD (gastroesophageal reflux disease)   . Heart murmur    "mild one" (12/19/2017)  . Hematochezia 11/19/2009  . History of blood transfusion    "low HgB" (12/19/2017)  . Hyperlipidemia   . Hypertension   . Iron deficiency anemia 11/19/2009  . Osteoarthritis    "was in my knees" (12/19/2017)  . PAD (peripheral artery disease) ([place])   . 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 Sleep apnea    "trial mask in the 1990s; haven't used one since" (12/19/2017)  . Type II diabetes mellitus ([place])    Past Surgical History:  Procedure Laterality Date  . ABDOMINAL AORTOGRAM W/LOWER EXTREMITY N/A 12/19/2017   Procedure: ABDOMINAL AORTOGRAM W/LOWER EXTREMITY;  Surgeon: Lorretta Harp, MD;  Location: [place] [facility];  Service: Cardiovascular;  Laterality: N/A;  . CARDIAC CATHETERIZATION  08/1999   which revealed a mild coronary artery disease with 60 and 70 and 80% stenosis in a small first diagonal vessel at the LAD, 50% mid LAD stenosis, 50% ostial left circumflrx stenosis, and 40% ostial and proximal intermediate stenosis. he had 10 to 20% irregularities of his mid right coronary artery.  . CHOLECYSTECTOMY N/A 07/08/2017   Procedure: LAPAROSCOPIC CHOLECYSTECTOMY;  Surgeon: Aviva Signs, MD;  Location: [facility];  Service: General;  Laterality: N/A;  . CIRCUMCISION  07/17/2012   Procedure: CIRCUMCISION ADULT;  Surgeon: Dutch Gray, MD;  Location: [facility]  ORS;  Service: Urology;  Laterality: N/A;  . COLONOSCOPY  02/14/06   M JENKINS  . COLONOSCOPY N/A 10/28/2014   Procedure: COLONOSCOPY;  Surgeon: Rogene Houston, MD;  Location: [facility];  Service: Endoscopy;  Laterality: N/A;  730  . ENTEROSCOPY N/A 10/04/2014   Procedure: ENTEROSCOPY;  Surgeon: Carol Ada, MD;  Location: [facility] ENDOSCOPY;  Service: Endoscopy;  Laterality: N/A;  . ERCP  12/25/1996   ROURK  . GIVENS CAPSULE STUDY N/A 10/04/2014   Procedure: GIVENS CAPSULE STUDY;  Surgeon: Carol Ada, MD;  Location: [place];  Service: Endoscopy;  Laterality: N/A;  . GIVENS CAPSULE STUDY N/A 08/04/2018   Procedure: GIVENS CAPSULE STUDY;  Surgeon: Rogene Houston, MD;  Location: [facility];  Service: Endoscopy;  Laterality: N/A;  7:00am  . JOINT REPLACEMENT    . PERIPHERAL VASCULAR ATHERECTOMY Left 12/19/2017   Procedure: PERIPHERAL VASCULAR ATHERECTOMY;  Surgeon: Lorretta Harp, MD;  Location: [place] [facility];  Service: Cardiovascular;  Laterality: Left;  . PERIPHERAL VASCULAR INTERVENTION Left 12/19/2017   Procedure: PERIPHERAL VASCULAR INTERVENTION;  Surgeon: Lorretta Harp, MD;  Location: [place] [facility] LAB;  Service: Cardiovascular;  Laterality: Left;  stent  . SMALL BOWEL GIVENS  11/25/2008  . TOTAL KNEE ARTHROPLASTY  2007-2008  . UPPER GASTROINTESTINAL ENDOSCOPY  11/08/2008   EGD TCS   Social History   Socioeconomic History  . Marital status: Married    Spouse name: Not on  file  . Number of children: Not on file  . Years of education: Not on file  . Highest education level: Not on file  Occupational History  . Not on file  Tobacco Use  . Smoking status: Former Smoker    Packs/day: 2.00    Years: 50.00    Pack years: 100.00    Types: Cigarettes    Quit date: 09/28/2014    Years since quitting: 5.8  . Smokeless tobacco: Never Used  Vaping Use  . Vaping Use: Never used  Substance and Sexual Activity  . Alcohol use: Never    Alcohol/week: 0.0 standard drinks   . Drug use: Never  . Sexual activity: Not Currently  Other Topics Concern  . Not on file  Social History Narrative  . Not on file   Social Determinants of Health   Financial Resource Strain: Not on file  Food Insecurity: Not on file  Transportation Needs: Not on file  Physical Activity: Not on file  Stress: Not on file  Social Connections: Not on file   Family History  Problem Relation Age of Onset  . Healthy Sister   . Healthy Brother   . Healthy Sister   . Healthy Brother   . Healthy Brother   . Healthy Daughter   . Healthy Daughter    Allergies  Allergen Reactions  . Morphine And Related Itching   Prior to Admission medications   Medication Sig Start Date End Date Taking? Authorizing Provider  amLODipine (NORVASC) 10 MG tablet TAKE 1 TABLET EVERY DAY 03/28/20   Lorretta Harp, MD  aspirin 81 MG tablet Take 1 tablet (81 mg total) by mouth daily. HOLD FOR 1 WEEK, THEN RESUME IF NO BLEEDING PROBLEMS 10/05/14   Delfina Redwood, MD  atorvastatin (LIPITOR) 80 MG tablet TAKE 1 TABLET EVERY DAY 02/18/20   Lorretta Harp, MD  cyanocobalamin (,VITAMIN B-12,) 1000 MCG/ML injection Inject into the muscle. 04/21/20   [provider]  empagliflozin (JARDIANCE) 25 MG TABS tablet Take 25 mg by mouth daily.     [provider]  ferrous sulfate 325 (65 FE) MG EC tablet Take 1 tablet (325 mg total) by mouth 2 (two) times daily after a meal. Patient taking differently: Take 325 mg by mouth daily with breakfast.  10/28/14   Rehman, Mechele Dawley, MD  finasteride (PROSCAR) 5 MG tablet Take 5 mg by mouth daily.    [provider]  Insulin Degludec-Liraglutide (XULTOPHY) 100-3.6 UNIT-MG/ML SOPN Inject 24 Units into the skin daily.     [provider]  Loratadine (CLARITIN PO) Take by mouth daily.    [provider]  metFORMIN (GLUCOPHAGE) 500 MG tablet  12/03/19   [provider]  metFORMIN (GLUCOPHAGE-XR) 500 MG 24 hr tablet  06/27/19    [provider]  pantoprazole (PROTONIX) 40 MG tablet TAKE 1 TABLET EVERY DAY 03/01/19   Rehman, Mechele Dawley, MD  ramipril (ALTACE) 10 MG capsule TAKE 1 CAPSULE EVERY DAY 03/17/20   Troy Sine, MD  sertraline (ZOLOFT) 25 MG tablet Take 25 mg by mouth daily. 01/09/20   [provider]  tamsulosin (FLOMAX) 0.4 MG CAPS capsule Take 0.4 mg by mouth.    [provider]  tobramycin (TOBREX) 0.3 % ophthalmic solution 1 drop 4 (four) times daily. 03/04/20   [provider]  ZYLET 0.5-0.3 % SUSP Apply 1 drop to eye 4 (four) times daily. 03/03/20   [provider]     Positive  ROS: Otherwise negative  All other systems have been reviewed and were otherwise negative with the exception of those mentioned in the HPI and as above.  Physical Exam: Constitutional: Alert, well-appearing, no acute distress Ears: External ears without lesions or tenderness. Ear canals are clear bilaterally with intact, clear TMs.  Nasal: External nose without lesions. Septum with mild deformity.. Clear nasal passages otherwise. Oral: Lips and gums without lesions. Tongue and palate mucosa without lesions. Posterior oropharynx clear.  Indirect laryngoscopy revealed a clear base of tongue vallecula epiglottis.  Vocal cords were clear bilaterally with normal vocal mobility.  Piriform sinuses were clear. Neck: No palpable adenopathy noted.  However patient has a subcutaneous nodule in the mid left neck measuring approximately 1-1/2 cm in size.  On palpation this is consistent with probable lipoma.  No supraclavicular adenopathy noted no thyroid nodules noted. Respiratory: Breathing comfortably Cardiac exam: Regular rate and rhythm  Skin: No facial/neck lesions or rash noted.  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  Plan: Discussed excision of this under local anesthetic and we will plan on scheduling this in the next few  weeks.  He will stop his aspirin 4 to 5 days preop.   Radene Journey, MD   CC:

## 2020-08-13 DIAGNOSIS — Z125 Encounter for screening for malignant neoplasm of prostate: Secondary | ICD-10-CM | POA: Diagnosis not present

## 2020-08-20 DIAGNOSIS — N401 Enlarged prostate with lower urinary tract symptoms: Secondary | ICD-10-CM | POA: Diagnosis not present

## 2020-08-20 DIAGNOSIS — R3912 Poor urinary stream: Secondary | ICD-10-CM | POA: Diagnosis not present

## 2020-08-28 DIAGNOSIS — D1801 Hemangioma of skin and subcutaneous tissue: Secondary | ICD-10-CM | POA: Diagnosis not present

## 2020-08-28 DIAGNOSIS — L819 Disorder of pigmentation, unspecified: Secondary | ICD-10-CM | POA: Diagnosis not present

## 2020-08-28 DIAGNOSIS — L400 Psoriasis vulgaris: Secondary | ICD-10-CM | POA: Diagnosis not present

## 2020-08-28 DIAGNOSIS — D225 Melanocytic nevi of trunk: Secondary | ICD-10-CM | POA: Diagnosis not present

## 2020-08-28 DIAGNOSIS — L821 Other seborrheic keratosis: Secondary | ICD-10-CM | POA: Diagnosis not present

## 2020-09-01 DIAGNOSIS — D519 Vitamin B12 deficiency anemia, unspecified: Secondary | ICD-10-CM | POA: Diagnosis not present

## 2020-09-01 DIAGNOSIS — Z01818 Encounter for other preprocedural examination: Secondary | ICD-10-CM | POA: Diagnosis not present

## 2020-09-03 DIAGNOSIS — J309 Allergic rhinitis, unspecified: Secondary | ICD-10-CM | POA: Diagnosis not present

## 2020-09-03 DIAGNOSIS — Z23 Encounter for immunization: Secondary | ICD-10-CM | POA: Diagnosis not present

## 2020-09-03 DIAGNOSIS — D519 Vitamin B12 deficiency anemia, unspecified: Secondary | ICD-10-CM | POA: Diagnosis not present

## 2020-09-03 DIAGNOSIS — Z0000 Encounter for general adult medical examination without abnormal findings: Secondary | ICD-10-CM | POA: Diagnosis not present

## 2020-09-03 DIAGNOSIS — Z713 Dietary counseling and surveillance: Secondary | ICD-10-CM | POA: Diagnosis not present

## 2020-09-03 DIAGNOSIS — R221 Localized swelling, mass and lump, neck: Secondary | ICD-10-CM | POA: Diagnosis not present

## 2020-09-03 DIAGNOSIS — G4730 Sleep apnea, unspecified: Secondary | ICD-10-CM | POA: Diagnosis not present

## 2020-09-03 DIAGNOSIS — R5383 Other fatigue: Secondary | ICD-10-CM | POA: Diagnosis not present

## 2020-09-03 DIAGNOSIS — Z0001 Encounter for general adult medical examination with abnormal findings: Secondary | ICD-10-CM | POA: Diagnosis not present

## 2020-09-04 DIAGNOSIS — D170 Benign lipomatous neoplasm of skin and subcutaneous tissue of head, face and neck: Secondary | ICD-10-CM | POA: Diagnosis not present

## 2020-09-23 ENCOUNTER — Encounter: Payer: Self-pay | Admitting: Podiatry

## 2020-09-23 ENCOUNTER — Other Ambulatory Visit: Payer: Self-pay

## 2020-09-23 ENCOUNTER — Ambulatory Visit: Payer: Medicare HMO | Admitting: Podiatry

## 2020-09-23 DIAGNOSIS — M205X1 Other deformities of toe(s) (acquired), right foot: Secondary | ICD-10-CM

## 2020-09-23 DIAGNOSIS — M79675 Pain in left toe(s): Secondary | ICD-10-CM | POA: Diagnosis not present

## 2020-09-23 DIAGNOSIS — M2042 Other hammer toe(s) (acquired), left foot: Secondary | ICD-10-CM | POA: Diagnosis not present

## 2020-09-23 DIAGNOSIS — M205X2 Other deformities of toe(s) (acquired), left foot: Secondary | ICD-10-CM | POA: Diagnosis not present

## 2020-09-23 DIAGNOSIS — B351 Tinea unguium: Secondary | ICD-10-CM

## 2020-09-23 DIAGNOSIS — I739 Peripheral vascular disease, unspecified: Secondary | ICD-10-CM

## 2020-09-23 DIAGNOSIS — M79674 Pain in right toe(s): Secondary | ICD-10-CM

## 2020-09-23 DIAGNOSIS — M2041 Other hammer toe(s) (acquired), right foot: Secondary | ICD-10-CM | POA: Diagnosis not present

## 2020-09-23 DIAGNOSIS — E119 Type 2 diabetes mellitus without complications: Secondary | ICD-10-CM | POA: Diagnosis not present

## 2020-09-23 NOTE — Progress Notes (Signed)
This patient returns to my office for at risk foot care.  This patient requires this care by a professional since this patient will be at risk due to having diabetes and PAD.   This patient is unable to cut nails himself since the patient cannot reach his nails.These nails are painful walking and wearing shoes.  He also has painful corn fourth toe right foot. This patient presents for at risk foot care today.  General Appearance  Alert, conversant and in no acute stress.  Vascular  Dorsalis pedis and posterior tibial  pulses are palpable  bilaterally.  Capillary return is within normal limits  bilaterally. Temperature is within normal limits  bilaterally.  Neurologic  Senn-Weinstein monofilament wire test diminished bilaterally. Muscle power within normal limits bilaterally.  Nails Thick disfigured discolored nails with subungual debris  from hallux to fifth toes bilaterally. No evidence of bacterial infection or drainage bilaterally.  Orthopedic  No limitations of motion  feet .  No crepitus or effusions noted.  No bony pathology or digital deformities noted. DJD 1st  MPJ  B/L.  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    Skin  normotropic skin with no porokeratosis noted bilaterally.  No signs of infections or ulcers noted.   Corn lateral aspect fourth toe right foot.  Asymptomatic porokeratosis sub 4th left  Onychomycosis  Pain in right toes  Pain in left toes Corn fourth toe right foot.  Consent was obtained for treatment procedures.   Mechanical debridement of nails 1-5  bilaterally performed with a nail nipper.  Filed with dremel without incident.  Debride corn with # 15 blade. Dispense diabetic insoles.  Patient presents today and was dispensed 0ne pair ( two units) of medically necessary extra depth shoes with three pair( six units) of custom molded multiple density inserts. The shoes and the inserts are fitted to the patients ' feet and are noted to fit well and are free of defect.  Length and width of the  shoes are also acceptable.  Patient was given written and verbal  instructions for wearing.  If any concerns arrive with the shoes or inserts, the patient is to call the office.Patient is to follow up with doctor in six weeks.   Return office visit   10 weeks                  Told patient to return for periodic foot care and evaluation due to potential at risk complications.   Gardiner Barefoot DPM

## 2020-09-25 DIAGNOSIS — D519 Vitamin B12 deficiency anemia, unspecified: Secondary | ICD-10-CM | POA: Diagnosis not present

## 2020-10-12 ENCOUNTER — Other Ambulatory Visit: Payer: Self-pay

## 2020-10-12 ENCOUNTER — Ambulatory Visit
Admission: RE | Admit: 2020-10-12 | Discharge: 2020-10-12 | Disposition: A | Discharge status: Home / Self Care | Payer: Medicare HMO | Source: Ambulatory Visit | Attending: Physician Assistant | Admitting: Physician Assistant

## 2020-10-12 VITALS — BP 133/68 | HR 85 | Temp 97.5°F | Resp 18

## 2020-10-12 DIAGNOSIS — J309 Allergic rhinitis, unspecified: Secondary | ICD-10-CM | POA: Diagnosis not present

## 2020-10-12 DIAGNOSIS — R059 Cough, unspecified: Secondary | ICD-10-CM

## 2020-10-12 MED ORDER — PREDNISONE 10 MG (21) PO TBPK: ORAL_TABLET | ORAL | 0 refills | Status: DC

## 2020-10-12 NOTE — Discharge Instructions (Signed)
Instead of Zyrtec, try XYZAL daily. Continue to use your nasal spray. Delsym is great for cough and only twice a day. Take all of your steroids which will help with current symptoms. No evidence of a bacterial infection. Drink plenty of water.

## 2020-10-12 NOTE — ED Triage Notes (Signed)
Cough and congestion x 1 week 

## 2020-10-12 NOTE — ED Provider Notes (Signed)
[facility]    CSN: [id number] Arrival date & time: 10/12/20  0849      History   Chief Complaint No chief complaint on file.   HPI Luke [name] is a 70 y.o. male.   Who carries a history of seasonal allergies. He notes for a week he has been suffering with sinus congestion and drainage that is making him cough more. It is non-productive. He takes daily Zyrtec without much relief. He denies fevers or chills, and overall does not feel ill.      Past Medical History:  Diagnosis Date  . CAD (coronary artery disease) 10/02/2010   nuclear study show normal perfusion on medical therapy without scar or ischemia. EF 64%  . GERD (gastroesophageal reflux disease)   . Heart murmur    "mild one" (12/19/2017)  . Hematochezia 11/19/2009  . History of blood transfusion    "low HgB" (12/19/2017)  . Hyperlipidemia   . Hypertension   . Iron deficiency anemia 11/19/2009  . Osteoarthritis    "was in my knees" (12/19/2017)  . PAD (peripheral artery disease) ([place])   . 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 Sleep apnea    "trial mask in the 1990s; haven't used one since" (12/19/2017)  . Type II diabetes mellitus [facility])     Patient Active Problem List   Diagnosis Date Noted  . Pain due to onychomycosis of toenails of both feet 11/21/2019  . Corns and callosities 11/21/2019  . Diabetes mellitus without complication ([place]) 32/20/2542  . Acquired hallux limitus of both feet 08/22/2019  . Acquired hammer toes of both feet 08/22/2019  . Iron deficiency anemia 07/26/2018  . Peripheral arterial disease ([place]) 12/14/2017  . Acute cholecystitis 07/08/2017  . Absolute anemia   . Vitamin B 12 deficiency 10/04/2014  . GI bleed-Occult ? 10/03/2014  . CAD (coronary artery disease) 12/13/2012  . GERD (gastroesophageal reflux disease) 08/31/2011  . IDA (iron deficiency anemia) 08/31/2011  . DM type 2 (diabetes mellitus, type 2) ([place]) 08/31/2011  . HTN (hypertension) 08/31/2011  . Hyperlipemia  08/31/2011    Past Surgical History:  Procedure Laterality Date  . ABDOMINAL AORTOGRAM W/LOWER EXTREMITY N/A 12/19/2017   Procedure: ABDOMINAL AORTOGRAM W/LOWER EXTREMITY;  Surgeon: Lorretta Harp, MD;  Location: [place] [facility];  Service: Cardiovascular;  Laterality: N/A;  . CARDIAC CATHETERIZATION  08/1999   which revealed a mild coronary artery disease with 60 and 70 and 80% stenosis in a small first diagonal vessel at the LAD, 50% mid LAD stenosis, 50% ostial left circumflrx stenosis, and 40% ostial and proximal intermediate stenosis. he had 10 to 20% irregularities of his mid right coronary artery.  . CHOLECYSTECTOMY N/A 07/08/2017   Procedure: LAPAROSCOPIC CHOLECYSTECTOMY;  Surgeon: Aviva Signs, MD;  Location: [facility];  Service: General;  Laterality: N/A;  . CIRCUMCISION  07/17/2012   Procedure: CIRCUMCISION ADULT;  Surgeon: Dutch Gray, MD;  Location: [facility];  Service: Urology;  Laterality: N/A;  . COLONOSCOPY  02/14/06   M JENKINS  . COLONOSCOPY N/A 10/28/2014   Procedure: COLONOSCOPY;  Surgeon: Rogene Houston, MD;  [facility];  Service: Endoscopy;  Laterality: N/A;  730  . ENTEROSCOPY N/A 10/04/2014   Procedure: ENTEROSCOPY;  Surgeon: Carol Ada, MD;  Location: [facility];  Service: Endoscopy;  Laterality: N/A;  . ERCP  12/25/1996   ROURK  . GIVENS CAPSULE STUDY N/A 10/04/2014   Procedure: GIVENS CAPSULE STUDY;  Surgeon: Carol Ada, MD;  Location: [place];  Service: Endoscopy;  Laterality: N/A;  . GIVENS CAPSULE STUDY N/A 08/04/2018   Procedure: GIVENS CAPSULE STUDY;  Surgeon: Rogene Houston, MD;  [facility];  Service: Endoscopy;  Laterality: N/A;  7:00am  . JOINT REPLACEMENT    . PERIPHERAL VASCULAR ATHERECTOMY Left 12/19/2017   Procedure: PERIPHERAL VASCULAR ATHERECTOMY;  Surgeon: Lorretta Harp, MD;  Location: [place] [facility];  Service: Cardiovascular;  Laterality: Left;  . PERIPHERAL VASCULAR INTERVENTION Left 12/19/2017   Procedure:  PERIPHERAL VASCULAR INTERVENTION;  Surgeon: Lorretta Harp, MD;  Location: [place] [facility];  Service: Cardiovascular;  Laterality: Left;  stent  . SMALL BOWEL GIVENS  11/25/2008  . TOTAL KNEE ARTHROPLASTY  2007-2008  . UPPER GASTROINTESTINAL ENDOSCOPY  11/08/2008   EGD TCS       Home Medications    Prior to Admission medications   Medication Sig Start Date End Date Taking? Authorizing Provider  amLODipine (NORVASC) 10 MG tablet TAKE 1 TABLET EVERY DAY 03/28/20   Lorretta Harp, MD  aspirin 81 MG tablet Take 1 tablet (81 mg total) by mouth daily. HOLD FOR 1 WEEK, THEN RESUME IF NO BLEEDING PROBLEMS 10/05/14   Delfina Redwood, MD  atorvastatin (LIPITOR) 80 MG tablet TAKE 1 TABLET EVERY DAY 02/18/20   Lorretta Harp, MD  cyanocobalamin (,VITAMIN B-12,) 1000 MCG/ML injection Inject into the muscle. 04/21/20   [provider]  empagliflozin (JARDIANCE) 25 MG TABS tablet Take 25 mg by mouth daily.     [provider]  ferrous sulfate 325 (65 FE) MG EC tablet Take 1 tablet (325 mg total) by mouth 2 (two) times daily after a meal. Patient taking differently: Take 325 mg by mouth daily with breakfast.  10/28/14   Rehman, Mechele Dawley, MD  finasteride (PROSCAR) 5 MG tablet Take 5 mg by mouth daily.    [provider]  Insulin Degludec-Liraglutide (XULTOPHY) 100-3.6 UNIT-MG/ML SOPN Inject 24 Units into the skin daily.     [provider]  Loratadine (CLARITIN PO) Take by mouth daily.    [provider]  metFORMIN (GLUCOPHAGE) 500 MG tablet  12/03/19   [provider]  metFORMIN (GLUCOPHAGE-XR) 500 MG 24 hr tablet  06/27/19   [provider]  pantoprazole (PROTONIX) 40 MG tablet TAKE 1 TABLET EVERY DAY 03/01/19   Rehman, Mechele Dawley, MD  ramipril (ALTACE) 10 MG capsule TAKE 1 CAPSULE EVERY DAY 03/17/20   Troy Sine, MD  sertraline (ZOLOFT) 25 MG tablet Take 25 mg by mouth daily. 01/09/20   [provider]  tamsulosin (FLOMAX)  0.4 MG CAPS capsule Take 0.4 mg by mouth.    [provider]  tobramycin (TOBREX) 0.3 % ophthalmic solution 1 drop 4 (four) times daily. 03/04/20   [provider]  ZYLET 0.5-0.3 % SUSP Apply 1 drop to eye 4 (four) times daily. 03/03/20   [provider]    Family History Family History  Problem Relation Age of Onset  . Healthy Sister   . Healthy Brother   . Healthy Sister   . Healthy Brother   . Healthy Brother   . Healthy Daughter   . Healthy Daughter     Social History Social History   Tobacco Use  . Smoking status: Former Smoker    Packs/day: 2.00    Years: 50.00    Pack years: 100.00    Types: Cigarettes    Quit date: 09/28/2014    Years since quitting: 6.0  . Smokeless tobacco: Never  Used  Vaping Use  . Vaping Use: Never used  Substance Use Topics  . Alcohol use: Never    Alcohol/week: 0.0 standard drinks  . Drug use: Never     Allergies   Morphine and related   Review of Systems Review of Systems  Constitutional: Negative for fever.  HENT: Positive for congestion and rhinorrhea. Negative for sinus pressure and sinus pain.   Eyes: Negative.   Respiratory: Positive for cough. Negative for shortness of breath and wheezing.   Musculoskeletal: Negative.   Skin: Negative.   All other systems reviewed and are negative.    Physical Exam Triage Vital Signs ED Triage Vitals  Enc Vitals Group     BP 10/12/20 0859 133/68     Pulse Rate 10/12/20 0859 85     Resp 10/12/20 0859 18     Temp 10/12/20 0859 (!) 97.5 F (36.4 C)     Temp Source 10/12/20 0859 Oral     SpO2 10/12/20 0859 97 %     Weight --      Height --      Head Circumference --      Peak Flow --      Pain Score 10/12/20 0900 0     Pain Loc --      Pain Edu? --      Excl. in [place]? --    No data found.  Updated Vital Signs BP 133/68   Pulse 85   Temp (!) 97.5 F (36.4 C) (Oral)   Resp 18   SpO2 97%   Visual Acuity Right Eye Distance:   Left Eye Distance:    Bilateral Distance:    Right Eye Near:   Left Eye Near:    Bilateral Near:     Physical Exam Vitals and nursing note reviewed.  Constitutional:      Appearance: Normal appearance.  HENT:     Head: Normocephalic and atraumatic.     Right Ear: Tympanic membrane normal.     Left Ear: Tympanic membrane normal.     Nose: Congestion and rhinorrhea present.     Comments: Erythematous and swollen turbinates with clear drainage    Mouth/Throat:     Mouth: Mucous membranes are moist.  Eyes:     General:        Right eye: No discharge.        Left eye: No discharge.  Cardiovascular:     Rate and Rhythm: Normal rate and regular rhythm.  Pulmonary:     Effort: Pulmonary effort is normal.     Breath sounds: Normal breath sounds. No wheezing, rhonchi or rales.  Skin:    General: Skin is warm and dry.  Neurological:     General: No focal deficit present.     Mental Status: He is alert.  Psychiatric:        Mood and Affect: Mood normal.      UC Treatments / Results  Labs (all labs ordered are listed, but only abnormal results are displayed) Labs Reviewed - No data to display  EKG   Radiology No results found.  Procedures Procedures (including critical care time)  Medications Ordered in UC Medications - No data to display  Initial Impression / Assessment and Plan / UC Course  I have reviewed the triage vital signs and the nursing notes.  Pertinent labs & imaging results that were available during my care of the patient were reviewed by me and considered in my medical decision making (  see chart for details).    Acute on chronic allergic rhinitis and congestion. No indication for an antibiotic. Treat with prednisone pack, change to XYZAL and try Delsym for cough. Push water.  Final Clinical Impressions(s) / UC Diagnoses   Final diagnoses:  None   Discharge Instructions   None    ED Prescriptions    None     PDMP not reviewed this encounter.   Bjorn Pippin, [place] 10/12/20 [phone]

## 2020-10-20 DIAGNOSIS — J069 Acute upper respiratory infection, unspecified: Secondary | ICD-10-CM | POA: Diagnosis not present

## 2020-10-20 DIAGNOSIS — R059 Cough, unspecified: Secondary | ICD-10-CM | POA: Diagnosis not present

## 2020-10-28 DIAGNOSIS — E782 Mixed hyperlipidemia: Secondary | ICD-10-CM | POA: Diagnosis not present

## 2020-10-28 DIAGNOSIS — I10 Essential (primary) hypertension: Secondary | ICD-10-CM | POA: Diagnosis not present

## 2020-10-28 DIAGNOSIS — D509 Iron deficiency anemia, unspecified: Secondary | ICD-10-CM | POA: Diagnosis not present

## 2020-10-28 DIAGNOSIS — R5383 Other fatigue: Secondary | ICD-10-CM | POA: Diagnosis not present

## 2020-10-28 DIAGNOSIS — D519 Vitamin B12 deficiency anemia, unspecified: Secondary | ICD-10-CM | POA: Diagnosis not present

## 2020-10-28 DIAGNOSIS — E1169 Type 2 diabetes mellitus with other specified complication: Secondary | ICD-10-CM | POA: Diagnosis not present

## 2020-11-04 DIAGNOSIS — D509 Iron deficiency anemia, unspecified: Secondary | ICD-10-CM | POA: Diagnosis not present

## 2020-11-04 DIAGNOSIS — D519 Vitamin B12 deficiency anemia, unspecified: Secondary | ICD-10-CM | POA: Diagnosis not present

## 2020-11-04 DIAGNOSIS — I739 Peripheral vascular disease, unspecified: Secondary | ICD-10-CM | POA: Diagnosis not present

## 2020-11-04 DIAGNOSIS — I10 Essential (primary) hypertension: Secondary | ICD-10-CM | POA: Diagnosis not present

## 2020-11-04 DIAGNOSIS — E1169 Type 2 diabetes mellitus with other specified complication: Secondary | ICD-10-CM | POA: Diagnosis not present

## 2020-11-04 DIAGNOSIS — N400 Enlarged prostate without lower urinary tract symptoms: Secondary | ICD-10-CM | POA: Diagnosis not present

## 2020-11-04 DIAGNOSIS — R5383 Other fatigue: Secondary | ICD-10-CM | POA: Diagnosis not present

## 2020-11-04 DIAGNOSIS — E782 Mixed hyperlipidemia: Secondary | ICD-10-CM | POA: Diagnosis not present

## 2020-11-04 DIAGNOSIS — G4710 Hypersomnia, unspecified: Secondary | ICD-10-CM | POA: Diagnosis not present

## 2020-11-11 ENCOUNTER — Other Ambulatory Visit: Payer: Self-pay | Admitting: Cardiovascular Disease

## 2020-11-11 DIAGNOSIS — R5383 Other fatigue: Secondary | ICD-10-CM | POA: Diagnosis not present

## 2020-11-11 DIAGNOSIS — U071 COVID-19: Secondary | ICD-10-CM | POA: Diagnosis not present

## 2020-11-19 DIAGNOSIS — J069 Acute upper respiratory infection, unspecified: Secondary | ICD-10-CM | POA: Diagnosis not present

## 2020-11-19 DIAGNOSIS — R059 Cough, unspecified: Secondary | ICD-10-CM | POA: Diagnosis not present

## 2020-11-19 DIAGNOSIS — R5383 Other fatigue: Secondary | ICD-10-CM | POA: Diagnosis not present

## 2020-11-26 DIAGNOSIS — G4730 Sleep apnea, unspecified: Secondary | ICD-10-CM | POA: Diagnosis not present

## 2020-12-03 ENCOUNTER — Encounter: Payer: Self-pay | Admitting: Podiatry

## 2020-12-03 ENCOUNTER — Other Ambulatory Visit: Payer: Self-pay

## 2020-12-03 ENCOUNTER — Ambulatory Visit (INDEPENDENT_AMBULATORY_CARE_PROVIDER_SITE_OTHER): Payer: Medicare HMO | Admitting: Podiatry

## 2020-12-03 DIAGNOSIS — M79675 Pain in left toe(s): Secondary | ICD-10-CM

## 2020-12-03 DIAGNOSIS — B351 Tinea unguium: Secondary | ICD-10-CM

## 2020-12-03 DIAGNOSIS — M79674 Pain in right toe(s): Secondary | ICD-10-CM

## 2020-12-03 DIAGNOSIS — L84 Corns and callosities: Secondary | ICD-10-CM

## 2020-12-03 DIAGNOSIS — E119 Type 2 diabetes mellitus without complications: Secondary | ICD-10-CM | POA: Diagnosis not present

## 2020-12-03 DIAGNOSIS — I739 Peripheral vascular disease, unspecified: Secondary | ICD-10-CM

## 2020-12-03 NOTE — Progress Notes (Addendum)
This patient returns to my office for at risk foot care.  This patient requires this care by a professional since this patient will be at risk due to having diabetes and PAD.    He has painful corn fourth toe right foot.  He presents to the office wearing a pad.This patient presents for at risk foot care today.  General Appearance  Alert, conversant and in no acute stress.  Vascular  Dorsalis pedis and posterior tibial  pulses are palpable  bilaterally.  Capillary return is within normal limits  bilaterally. Temperature is within normal limits  bilaterally.  Neurologic  Senn-Weinstein monofilament wire test within normal limits  bilaterally. Muscle power within normal limits bilaterally.  Nails Thick disfigured discolored nails with subungual debris  from hallux to fifth toes bilaterally. No evidence of bacterial infection or drainage bilaterally.  Orthopedic  No limitations of motion  feet .  No crepitus or effusions noted.  No bony pathology or digital deformities noted. DJD 1st  MPJ  B/L.  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    Skin  normotropic skin with no porokeratosis noted bilaterally.  No signs of infections or ulcers noted.   Corn lateral aspect fourth toe right foot.  Asymptomatic porokeratosis sub 4th left  Onychomycosis  Pain in right toes  Pain in left toes Corn fourth toe right foot.  Consent was obtained for treatment procedures.    Debride corn with # 15 blade. Patient says his diabetic shoes are doing well.   Return office visit   10 weeks                  Told patient to return for periodic foot care and evaluation due to potential at risk complications.    Gardiner Barefoot DPM

## 2020-12-08 ENCOUNTER — Other Ambulatory Visit: Payer: Self-pay | Admitting: Cardiovascular Disease

## 2020-12-10 DIAGNOSIS — Z794 Long term (current) use of insulin: Secondary | ICD-10-CM | POA: Diagnosis not present

## 2020-12-10 DIAGNOSIS — E119 Type 2 diabetes mellitus without complications: Secondary | ICD-10-CM | POA: Diagnosis not present

## 2020-12-10 DIAGNOSIS — H25813 Combined forms of age-related cataract, bilateral: Secondary | ICD-10-CM | POA: Diagnosis not present

## 2021-01-27 ENCOUNTER — Encounter: Payer: Self-pay | Admitting: Cardiovascular Disease

## 2021-01-27 ENCOUNTER — Other Ambulatory Visit: Payer: Self-pay

## 2021-01-27 ENCOUNTER — Ambulatory Visit: Payer: Medicare HMO | Admitting: Cardiovascular Disease

## 2021-01-27 VITALS — BP 130/54 | HR 80 | Ht 65.5 in | Wt 166.0 lb

## 2021-01-27 DIAGNOSIS — E782 Mixed hyperlipidemia: Secondary | ICD-10-CM

## 2021-01-27 DIAGNOSIS — I739 Peripheral vascular disease, unspecified: Secondary | ICD-10-CM | POA: Diagnosis not present

## 2021-01-27 DIAGNOSIS — I10 Essential (primary) hypertension: Secondary | ICD-10-CM

## 2021-01-27 DIAGNOSIS — R0989 Other specified symptoms and signs involving the circulatory and respiratory systems: Secondary | ICD-10-CM

## 2021-01-27 DIAGNOSIS — I2510 Atherosclerotic heart disease of native coronary artery without angina pectoris: Secondary | ICD-10-CM | POA: Diagnosis not present

## 2021-01-27 DIAGNOSIS — I2583 Coronary atherosclerosis due to lipid rich plaque: Secondary | ICD-10-CM

## 2021-01-27 NOTE — Assessment & Plan Note (Signed)
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  He did have a 50% right common iliac artery stenosis with a 15 to 20 mm gradient although was asymptomatic on that side.  Follow-up Dopplers performed as recently as 07/22/2020 revealed a patent iliac stent on the left.  His ABIs are 0.98 on the right and 0.78 on the left.  He does appear to have progression of disease on the right although he is asymptomatic on that side.

## 2021-01-27 NOTE — Assessment & Plan Note (Signed)
History of essential hypertension blood pressure measured at 130/54.  He is on amlodipine and ramipril.

## 2021-01-27 NOTE — Assessment & Plan Note (Signed)
History of noncritical CAD by remote cardiac catheterization performed by Dr. Claiborne Billings years ago.  And intervention was not performed.  He denies chest pain or shortness of breath.

## 2021-01-27 NOTE — Assessment & Plan Note (Addendum)
History of hyperlipidemia on high-dose statin therapy with lipid profile performed 10/28/2020 revealing total cholesterol 110, LDL 50 and HDL 44.  A more recent lipid profile performed 06/11/2021 revealed total cholesterol 127, LDL 75 and HDL 34.

## 2021-01-27 NOTE — Patient Instructions (Signed)
  Testing/Procedures:  Your physician has requested that you have a carotid duplex. This test is an ultrasound of the carotid arteries in your neck. It looks at blood flow through these arteries that supply the brain with blood. Allow one hour for this exam. There are no restrictions or special instructions. [place] physician has requested that you have an abdominal aorta duplex. During this test, an ultrasound is used to evaluate the aorta. Allow 30 minutes for this exam. Do not eat after midnight the day before and avoid carbonated beverages SCHEDULE IN [facility]  Your physician has requested that you have an ankle brachial index (ABI). During this test an ultrasound and blood pressure cuff are used to evaluate the arteries that supply the arms and legs with blood. Allow thirty minutes for this exam. There are no restrictions or special instructions. SCHEDULE IN FEBRUARY   Follow-Up: At [facility], you and your health needs are our priority.  As part of our continuing mission to provide you with exceptional heart care, we have created designated Provider Care Teams.  These Care Teams include your primary Cardiologist (physician) and Advanced Practice Providers (APPs -  Physician Assistants and Nurse Practitioners) who all work together to provide you with the care you need, when you need it.  We recommend signing up for the patient portal called "MyChart".  Sign up information is provided on this After Visit Summary.  MyChart is used to connect with patients for Virtual Visits (Telemedicine).  Patients are able to view lab/test results, encounter notes, upcoming appointments, etc.  Non-urgent messages can be sent to your provider as well.   To learn more about what you can do with MyChart, go to [email or web address].    Your next appointment:   12 month(s)  The format for your next appointment:   In Person  Provider:   Quay Burow, MD

## 2021-01-27 NOTE — Progress Notes (Addendum)
06/30/2021 Luke [name]   May 16, 1951  [id number]  Primary Physician Celene Squibb, MD Primary Cardiologist: Lorretta Harp MD FACP, [place], [place], [place]  HPI:  Luke [name] is a 70 y.o.  thin appearing married Caucasian male father of 2, grandfather of 4 grandchildren who I last saw in the office  01/26/2019. Apparently I took care of her father 15 years ago, Luke [name].  He was referred by Beckie Busing, NP for peripheral vascular evaluation because of lifestyle limiting claudication.  I last saw him in the office 01/29/2020.  His risk factors include greater than 100 pack years of tobacco abuse having quit 1 year ago, treated hypertension, diabetes and hyperlipidemia.  He is never had a heart attack or stroke.  He denies chest pain or shortness of breath.  He does have documented coronary disease by Dr. Claiborne Billings years ago but was never intervened on.  He is complained of lower extremity claudication for the last 4 years which has gone undiagnosed and treated as a "orthopedic issue".  Recent Dopplers performed in our office 12/12/2017 revealed a right ABI of 0.90 and left 0.51 with high-frequency signals in both iliac arteries.  I suspect this left common iliac is either occluded or or subtotally occluded.  He wishes to proceed with angiography and intervention.   I performed peripheral angiography on him 12/19/2017 revealing a 91% calcified ostial left common iliac artery stenosis and a 50% right.  I performed Dynabac orbital rotational atherectomy, PTA and covered stenting on him with an excellent angiographic and clinical result.  Follow-up Dopplers performed subsequent to that revealed increase in his left ABI from 0.51 up to 0.85.  His claudication has completely resolved.   Since I saw him in the office a year ago he is done well.  He denies chest pain, shortness of breath or claudication.  Dopplers performed 07/22/2020 revealed a right ABI of 0.98 and a left of 0.78.  His left iliac stent appeared patent  with some progression disease in the external iliac and his right iliac appears to have progressed although he denies claudication.   Current Meds  Medication Sig   amLODipine (NORVASC) 10 MG tablet TAKE 1 TABLET EVERY DAY   aspirin 81 MG tablet Take 1 tablet (81 mg total) by mouth daily. HOLD FOR 1 WEEK, THEN RESUME IF NO BLEEDING PROBLEMS   empagliflozin (JARDIANCE) 25 MG TABS tablet Take 25 mg by mouth daily.    ferrous sulfate 325 (65 FE) MG EC tablet Take 1 tablet (325 mg total) by mouth 2 (two) times daily after a meal. (Patient taking differently: Take 325 mg by mouth daily with breakfast.)   finasteride (PROSCAR) 5 MG tablet Take 5 mg by mouth daily.   Insulin Degludec-Liraglutide (XULTOPHY) 100-3.6 UNIT-MG/ML SOPN Inject 24 Units into the skin daily.    Loratadine (CLARITIN PO) Take by mouth daily.   metFORMIN (GLUCOPHAGE-XR) 500 MG 24 hr tablet    pantoprazole (PROTONIX) 40 MG tablet TAKE 1 TABLET EVERY DAY   sertraline (ZOLOFT) 25 MG tablet Take 25 mg by mouth daily.   tamsulosin (FLOMAX) 0.4 MG CAPS capsule Take 0.4 mg by mouth.   [DISCONTINUED] atorvastatin (LIPITOR) 80 MG tablet TAKE 1 TABLET EVERY DAY   [DISCONTINUED] ramipril (ALTACE) 10 MG capsule TAKE 1 CAPSULE EVERY DAY     Allergies  Allergen Reactions   Morphine And Related Itching    Social History   Socioeconomic History   Marital status: Married  Spouse name: Not on file   Number of children: Not on file   Years of education: Not on file   Highest education level: Not on file  Occupational History   Not on file  Tobacco Use   Smoking status: Former    Packs/day: 2.00    Years: 50.00    Pack years: 100.00    Types: Cigarettes    Quit date: 09/28/2014    Years since quitting: 6.7   Smokeless tobacco: Never  Vaping Use   Vaping Use: Never used  Substance and Sexual Activity   Alcohol use: Never    Alcohol/week: 0.0 standard drinks   Drug use: Never   Sexual activity: Not Currently  Other Topics  Concern   Not on file  Social History Narrative   Not on file   Social Determinants of Health   Financial Resource Strain: Not on file  Food Insecurity: Not on file  Transportation Needs: Not on file  Physical Activity: Not on file  Stress: Not on file  Social Connections: Not on file  Intimate Partner Violence: Not on file     Review of Systems: General: negative for chills, fever, night sweats or weight changes.  Cardiovascular: negative for chest pain, dyspnea on exertion, edema, orthopnea, palpitations, paroxysmal nocturnal dyspnea or shortness of breath Dermatological: negative for rash Respiratory: negative for cough or wheezing Urologic: negative for hematuria Abdominal: negative for nausea, vomiting, diarrhea, bright red blood per rectum, melena, or hematemesis Neurologic: negative for visual changes, syncope, or dizziness All other systems reviewed and are otherwise negative except as noted above.    Blood pressure (!) 130/54, pulse 80, height 5' 5.5" (1.664 m), weight 166 lb (75.3 kg).  General appearance: alert and no distress  EKG normal sinus rhythm at 80 without ST or T wave changes.  I personally reviewed this EKG.  ASSESSMENT AND PLAN:   HTN (hypertension) History of essential hypertension blood pressure measured at 130/54.  He is on amlodipine and ramipril.  Hyperlipemia History of hyperlipidemia on high-dose statin therapy with lipid profile performed 10/28/2020 revealing total cholesterol 110, LDL 50 and HDL 44.  A more recent lipid profile performed 06/11/2021 revealed total cholesterol 127, LDL 75 and HDL 34.  CAD (coronary artery disease) History of noncritical CAD by remote cardiac catheterization performed by Dr. Claiborne Billings years ago.  And intervention was not performed.  He denies chest pain or shortness of breath.  Peripheral arterial disease (HCC) History of peripheral arterial disease status post orbital atherectomy, PTA and covered stenting of a  high-grade calcified proximal left common iliac artery stenosis by myself 12/19/2017 (7 mm x 39 mm VBX).  He did have a 50% right common iliac artery stenosis with a 15 to 20 mm gradient although was asymptomatic on that side.  Follow-up Dopplers performed as recently as 07/22/2020 revealed a patent iliac stent on the left.  His ABIs are 0.98 on the right and 0.78 on the left.  He does appear to have progression of disease on the right although he is asymptomatic on that side.     Lorretta Harp MD FACP,FACC,FAHA, [facility] 06/30/2021 4:51 PM sinus rhythm at 80 without ST or T wave changes.  Personally reviewed this EKG.

## 2021-02-02 ENCOUNTER — Ambulatory Visit (HOSPITAL_COMMUNITY)
Admission: RE | Admit: 2021-02-02 | Discharge: 2021-02-02 | Disposition: A | Discharge status: Home / Self Care | Payer: Medicare HMO | Source: Ambulatory Visit | Attending: Internal Medicine | Admitting: Internal Medicine

## 2021-02-02 ENCOUNTER — Other Ambulatory Visit (HOSPITAL_COMMUNITY): Payer: Self-pay | Admitting: Cardiovascular Disease

## 2021-02-02 ENCOUNTER — Other Ambulatory Visit: Payer: Self-pay

## 2021-02-02 ENCOUNTER — Encounter: Payer: Self-pay | Admitting: *Deleted

## 2021-02-02 DIAGNOSIS — R0989 Other specified symptoms and signs involving the circulatory and respiratory systems: Secondary | ICD-10-CM | POA: Diagnosis not present

## 2021-02-02 DIAGNOSIS — I6523 Occlusion and stenosis of bilateral carotid arteries: Secondary | ICD-10-CM

## 2021-02-06 DIAGNOSIS — E538 Deficiency of other specified B group vitamins: Secondary | ICD-10-CM | POA: Diagnosis not present

## 2021-02-06 DIAGNOSIS — R5383 Other fatigue: Secondary | ICD-10-CM | POA: Diagnosis not present

## 2021-02-06 DIAGNOSIS — E1169 Type 2 diabetes mellitus with other specified complication: Secondary | ICD-10-CM | POA: Diagnosis not present

## 2021-02-06 DIAGNOSIS — D519 Vitamin B12 deficiency anemia, unspecified: Secondary | ICD-10-CM | POA: Diagnosis not present

## 2021-02-06 DIAGNOSIS — D509 Iron deficiency anemia, unspecified: Secondary | ICD-10-CM | POA: Diagnosis not present

## 2021-02-06 DIAGNOSIS — E782 Mixed hyperlipidemia: Secondary | ICD-10-CM | POA: Diagnosis not present

## 2021-02-06 DIAGNOSIS — I10 Essential (primary) hypertension: Secondary | ICD-10-CM | POA: Diagnosis not present

## 2021-02-09 DIAGNOSIS — I739 Peripheral vascular disease, unspecified: Secondary | ICD-10-CM | POA: Diagnosis not present

## 2021-02-09 DIAGNOSIS — D509 Iron deficiency anemia, unspecified: Secondary | ICD-10-CM | POA: Diagnosis not present

## 2021-02-09 DIAGNOSIS — E782 Mixed hyperlipidemia: Secondary | ICD-10-CM | POA: Diagnosis not present

## 2021-02-09 DIAGNOSIS — Z0001 Encounter for general adult medical examination with abnormal findings: Secondary | ICD-10-CM | POA: Diagnosis not present

## 2021-02-09 DIAGNOSIS — I10 Essential (primary) hypertension: Secondary | ICD-10-CM | POA: Diagnosis not present

## 2021-02-09 DIAGNOSIS — R5383 Other fatigue: Secondary | ICD-10-CM | POA: Diagnosis not present

## 2021-02-09 DIAGNOSIS — D519 Vitamin B12 deficiency anemia, unspecified: Secondary | ICD-10-CM | POA: Diagnosis not present

## 2021-02-09 DIAGNOSIS — E1169 Type 2 diabetes mellitus with other specified complication: Secondary | ICD-10-CM | POA: Diagnosis not present

## 2021-02-09 DIAGNOSIS — G4710 Hypersomnia, unspecified: Secondary | ICD-10-CM | POA: Diagnosis not present

## 2021-02-13 ENCOUNTER — Ambulatory Visit: Payer: Medicare HMO | Admitting: Podiatry

## 2021-02-16 ENCOUNTER — Encounter: Payer: Self-pay | Admitting: Podiatry

## 2021-02-16 ENCOUNTER — Ambulatory Visit (INDEPENDENT_AMBULATORY_CARE_PROVIDER_SITE_OTHER): Payer: Medicare HMO | Admitting: Podiatry

## 2021-02-16 ENCOUNTER — Other Ambulatory Visit: Payer: Self-pay

## 2021-02-16 DIAGNOSIS — E119 Type 2 diabetes mellitus without complications: Secondary | ICD-10-CM

## 2021-02-16 DIAGNOSIS — M79675 Pain in left toe(s): Secondary | ICD-10-CM | POA: Diagnosis not present

## 2021-02-16 DIAGNOSIS — M79674 Pain in right toe(s): Secondary | ICD-10-CM

## 2021-02-16 DIAGNOSIS — I739 Peripheral vascular disease, unspecified: Secondary | ICD-10-CM | POA: Diagnosis not present

## 2021-02-16 DIAGNOSIS — B351 Tinea unguium: Secondary | ICD-10-CM

## 2021-02-16 NOTE — Progress Notes (Signed)
This patient returns to my office for at risk foot care.  This patient requires this care by a professional since this patient will be at risk due to having diabetes and PAD.   This patient is unable to cut nails himself since the patient cannot reach his nails.These nails are painful walking and wearing shoes.  He also has painful corn fourth toe right foot. This patient presents for at risk foot care today.  General Appearance  Alert, conversant and in no acute stress.  Vascular  Dorsalis pedis and posterior tibial  pulses are palpable  bilaterally.  Capillary return is within normal limits  bilaterally. Temperature is within normal limits  bilaterally.  Neurologic  Senn-Weinstein monofilament wire test diminished bilaterally. Muscle power within normal limits bilaterally.  Nails Thick disfigured discolored nails with subungual debris  from hallux to fifth toes bilaterally. No evidence of bacterial infection or drainage bilaterally.  Orthopedic  No limitations of motion  feet .  No crepitus or effusions noted.  No bony pathology or digital deformities noted. DJD 1st  MPJ  B/L.  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    Skin  normotropic skin with no porokeratosis noted bilaterally.  No signs of infections or ulcers noted.   Corn lateral aspect fourth toe right foot.  Asymptomatic porokeratosis sub 4th left  Onychomycosis  Pain in right toes  Pain in left toes Corn fourth toe right foot.  Consent was obtained for treatment procedures.   Mechanical debridement of nails 1-5  bilaterally performed with a nail nipper.  Filed with dremel without incident.  Debride corn with # 15 blade.    Return office visit   10 weeks                  Told patient to return for periodic foot care and evaluation due to potential at risk complications.   Gardiner Barefoot DPM

## 2021-02-17 ENCOUNTER — Encounter: Payer: Self-pay | Admitting: *Deleted

## 2021-02-26 ENCOUNTER — Encounter: Payer: Self-pay | Admitting: *Deleted

## 2021-03-03 ENCOUNTER — Other Ambulatory Visit: Payer: Self-pay | Admitting: Cardiovascular Disease

## 2021-03-17 DIAGNOSIS — Z23 Encounter for immunization: Secondary | ICD-10-CM | POA: Diagnosis not present

## 2021-05-04 ENCOUNTER — Ambulatory Visit: Payer: Medicare HMO | Admitting: Podiatry

## 2021-05-13 ENCOUNTER — Encounter: Payer: Self-pay | Admitting: Podiatry

## 2021-05-13 ENCOUNTER — Ambulatory Visit: Payer: Medicare HMO | Admitting: Podiatry

## 2021-05-13 ENCOUNTER — Other Ambulatory Visit: Payer: Self-pay

## 2021-05-13 DIAGNOSIS — B351 Tinea unguium: Secondary | ICD-10-CM

## 2021-05-13 DIAGNOSIS — M79674 Pain in right toe(s): Secondary | ICD-10-CM

## 2021-05-13 DIAGNOSIS — I739 Peripheral vascular disease, unspecified: Secondary | ICD-10-CM

## 2021-05-13 DIAGNOSIS — M79675 Pain in left toe(s): Secondary | ICD-10-CM | POA: Diagnosis not present

## 2021-05-13 DIAGNOSIS — L84 Corns and callosities: Secondary | ICD-10-CM

## 2021-05-13 DIAGNOSIS — E119 Type 2 diabetes mellitus without complications: Secondary | ICD-10-CM

## 2021-05-13 NOTE — Progress Notes (Signed)
This patient returns to my office for at risk foot care.  This patient requires this care by a professional since this patient will be at risk due to having diabetes and PAD.   This patient is unable to cut nails himself since the patient cannot reach his nails.These nails are painful walking and wearing shoes.  He also has painful corn fourth toe right foot. This patient presents for at risk foot care today.  General Appearance  Alert, conversant and in no acute stress.  Vascular  Dorsalis pedis and posterior tibial  pulses are palpable  bilaterally.  Capillary return is within normal limits  bilaterally. Temperature is within normal limits  bilaterally.  Neurologic  Senn-Weinstein monofilament wire test diminished bilaterally. Muscle power within normal limits bilaterally.  Nails Thick disfigured discolored nails with subungual debris  from hallux to fifth toes bilaterally. No evidence of bacterial infection or drainage bilaterally.  Orthopedic  No limitations of motion  feet .  No crepitus or effusions noted.  No bony pathology or digital deformities noted. DJD 1st  MPJ  B/L.  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    Skin  normotropic skin with no porokeratosis noted bilaterally.  No signs of infections or ulcers noted.   Corn lateral aspect fourth toe right foot.  Asymptomatic porokeratosis sub 4th left  Onychomycosis  Pain in right toes  Pain in left toes Corn fourth toe right foot.  Consent was obtained for treatment procedures.   Mechanical debridement of nails 1-5  bilaterally performed with a nail nipper.  Filed with dremel without incident.  Debride corn with # 15 blade.    Return office visit   10 weeks                  Told patient to return for periodic foot care and evaluation due to potential at risk complications.   Gardiner Barefoot DPM

## 2021-05-20 ENCOUNTER — Ambulatory Visit (INDEPENDENT_AMBULATORY_CARE_PROVIDER_SITE_OTHER): Payer: Medicare HMO

## 2021-05-20 ENCOUNTER — Other Ambulatory Visit: Payer: Self-pay

## 2021-05-20 ENCOUNTER — Ambulatory Visit
Admission: RE | Admit: 2021-05-20 | Discharge: 2021-05-20 | Disposition: A | Discharge status: Home / Self Care | Payer: Medicare HMO | Source: Ambulatory Visit | Attending: Family Medicine | Admitting: Family Medicine

## 2021-05-20 VITALS — BP 154/68 | HR 82 | Temp 97.9°F | Resp 16

## 2021-05-20 DIAGNOSIS — R059 Cough, unspecified: Secondary | ICD-10-CM | POA: Diagnosis not present

## 2021-05-20 DIAGNOSIS — R051 Acute cough: Secondary | ICD-10-CM

## 2021-05-20 DIAGNOSIS — R062 Wheezing: Secondary | ICD-10-CM

## 2021-05-20 MED ORDER — PREDNISONE 20 MG PO TABS: 40.0000 mg | ORAL_TABLET | Freq: Every day | ORAL | 0 refills | Status: DC

## 2021-05-20 MED ORDER — BENZONATATE 100 MG PO CAPS: ORAL_CAPSULE | ORAL | 0 refills | Status: DC

## 2021-05-20 NOTE — ED Triage Notes (Signed)
Patient c/o productive cough x 1 week.  Patient endorses worsening symptoms in the morning.   Patient denies SOB.   Patient has taken Mucinex and Zyrtec with no relief of symptoms.

## 2021-05-20 NOTE — ED Provider Notes (Signed)
[facility]   [id number] 05/20/21 Arrival Time: 0904  ASSESSMENT & PLAN:  1. Acute cough   2. Wheezing     Discussed typical duration of viral illnesses. I have personally viewed the imaging studies ordered this visit. No signs of PNA. No clinical signs of lung infection; discussed. OTC symptom care as needed.  Meds ordered this encounter  Medications   benzonatate (TESSALON) 100 MG capsule    Sig: Take 1 capsule by mouth every 8 (eight) hours for cough.    Dispense:  21 capsule    Refill:  0   predniSONE (DELTASONE) 20 MG tablet    Sig: Take 2 tablets (40 mg total) by mouth daily.    Dispense:  10 tablet    Refill:  0     Follow-up Information     Celene Squibb, MD.   Specialty: Internal Medicine Why: As needed. Contact information: [facility] [address] [phone]                 Reviewed expectations re: course of current medical issues. Questions answered. Outlined signs and symptoms indicating need for more acute intervention. Understanding verbalized. After Visit Summary given.   SUBJECTIVE: History from: patient. Luke [name] is a 70 y.o. male who reports: coughing; x 1 week; ques mild wheezing at times; worse at night. No SOB/CP. Denies: difficulty breathing. Normal PO intake without n/v/d.   OBJECTIVE:  Vitals:   05/20/21 0909  BP: (!) 154/68  Pulse: 82  Resp: 16  Temp: 97.9 F (36.6 C)  TempSrc: Oral  SpO2: 98%    General appearance: alert; no distress Eyes: PERRLA; EOMI; conjunctiva normal HENT: [place]; AT; without nasal congestion Neck: supple  Lungs: speaks full sentences without difficulty; unlabored; mild wheezing that clears after a few breaths Extremities: no edema Skin: warm and dry Neurologic: normal gait Psychological: alert and cooperative; normal mood and affect  Imaging: DG Chest 2 View  Result Date: 05/20/2021 CLINICAL DATA:  Productive cough EXAM: CHEST - 2 VIEW COMPARISON:  Chest  x-ray dated December 16, 2017 FINDINGS: Cardiac and mediastinal contours within normal limits. Bilateral peribronchial cuffing. No focal consolidation. No evidence of pleural effusion or pneumothorax. IMPRESSION: Bilateral peribronchial cuffing, findings can be seen the setting of small airways disease, including infectious bronchitis. No focal consolidation to suggest pneumonia. Electronically Signed   By: Yetta Glassman M.D.   On: 05/20/2021 09:41    Allergies  Allergen Reactions   Morphine And Related Itching    Past Medical History:  Diagnosis Date   CAD (coronary artery disease) 10/02/2010   nuclear study show normal perfusion on medical therapy without scar or ischemia. 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   Sleep apnea    "trial mask in the 1990s; haven't used one since" (12/19/2017)   Type II diabetes mellitus ([place])    Social History   Socioeconomic History   Marital status: Married    Spouse name: Not on file   Number of children: Not on file   Years of education: Not on file   Highest education  level: Not on file  Occupational History   Not on file  Tobacco Use   Smoking status: Former    Packs/day: 2.00    Years: 50.00    Pack years: 100.00    Types: Cigarettes    Quit date: 09/28/2014    Years since quitting: 6.6   Smokeless tobacco: Never  Vaping Use   Vaping Use: Never used  Substance and Sexual Activity   Alcohol use: Never    Alcohol/week: 0.0 standard drinks   Drug use: Never   Sexual activity: Not Currently  Other Topics Concern   Not on file  Social History Narrative   Not on file   Social Determinants of Health    Financial Resource Strain: Not on file  Food Insecurity: Not on file  Transportation Needs: Not on file  Physical Activity: Not on file  Stress: Not on file  Social Connections: Not on file  Intimate Partner Violence: Not on file   Family History  Problem Relation Age of Onset   Healthy Sister    Healthy Brother    Healthy Sister    Healthy Brother    Healthy Brother    Healthy Daughter    Healthy Daughter    Past Surgical History:  Procedure Laterality Date   ABDOMINAL AORTOGRAM W/LOWER EXTREMITY N/A 12/19/2017   Procedure: ABDOMINAL AORTOGRAM W/LOWER EXTREMITY;  Surgeon: Lorretta Harp, MD;  Location: [place] [facility];  Service: Cardiovascular;  Laterality: N/A;   CARDIAC CATHETERIZATION  08/1999   which revealed a mild coronary artery disease with 60 and 70 and 80% stenosis in a small first diagonal vessel at the LAD, 50% mid LAD stenosis, 50% ostial left circumflrx stenosis, and 40% ostial and proximal intermediate stenosis. he had 10 to 20% irregularities of his mid right coronary artery.   CHOLECYSTECTOMY N/A 07/08/2017   Procedure: LAPAROSCOPIC CHOLECYSTECTOMY;  Surgeon: Aviva Signs, MD;  [facility];  Service: General;  Laterality: N/A;   CIRCUMCISION  07/17/2012   Procedure: CIRCUMCISION ADULT;  Surgeon: Dutch Gray, MD;  [facility]: [facility];  Service: Urology;  Laterality: N/A;   COLONOSCOPY  02/14/06   M JENKINS   COLONOSCOPY N/A 10/28/2014   Procedure: COLONOSCOPY;  Surgeon: Rogene Houston, MD;  [facility];  Service: Endoscopy;  Laterality: N/A;  730   ENTEROSCOPY N/A 10/04/2014   Procedure: ENTEROSCOPY;  Surgeon: Carol Ada, MD;  Location: [facility];  Service: Endoscopy;  Laterality: N/A;   ERCP  12/25/1996   ROURK   GIVENS CAPSULE STUDY N/A 10/04/2014   Procedure: GIVENS CAPSULE STUDY;  Surgeon: Carol Ada, MD;  Location: [facility];  Service: Endoscopy;  Laterality: N/A;   GIVENS CAPSULE STUDY N/A 08/04/2018   Procedure: GIVENS CAPSULE  STUDY;  Surgeon: Rogene Houston, MD;  [facility];  Service: Endoscopy;  Laterality: N/A;  7:00am   JOINT REPLACEMENT     PERIPHERAL VASCULAR ATHERECTOMY Left 12/19/2017   Procedure: PERIPHERAL VASCULAR ATHERECTOMY;  Surgeon: Lorretta Harp, MD;  Location: [place] [facility];  Service: Cardiovascular;  Laterality: Left;   PERIPHERAL VASCULAR INTERVENTION Left 12/19/2017   Procedure: PERIPHERAL VASCULAR INTERVENTION;  Surgeon: Lorretta Harp, MD;  Location: [facility];  Service: Cardiovascular;  Laterality: Left;  stent   SMALL BOWEL GIVENS  11/25/2008   TOTAL KNEE ARTHROPLASTY  2007-2008   UPPER GASTROINTESTINAL ENDOSCOPY  11/08/2008   EGD TCS     Vanessa Kick, MD 05/20/21 [phone]

## 2021-05-23 DIAGNOSIS — B349 Viral infection, unspecified: Secondary | ICD-10-CM | POA: Diagnosis not present

## 2021-05-23 DIAGNOSIS — R059 Cough, unspecified: Secondary | ICD-10-CM | POA: Diagnosis not present

## 2021-06-03 DIAGNOSIS — R059 Cough, unspecified: Secondary | ICD-10-CM | POA: Diagnosis not present

## 2021-06-03 DIAGNOSIS — I10 Essential (primary) hypertension: Secondary | ICD-10-CM | POA: Diagnosis not present

## 2021-06-10 DIAGNOSIS — D509 Iron deficiency anemia, unspecified: Secondary | ICD-10-CM | POA: Diagnosis not present

## 2021-06-10 DIAGNOSIS — I10 Essential (primary) hypertension: Secondary | ICD-10-CM | POA: Diagnosis not present

## 2021-06-10 DIAGNOSIS — E782 Mixed hyperlipidemia: Secondary | ICD-10-CM | POA: Diagnosis not present

## 2021-06-10 DIAGNOSIS — E1169 Type 2 diabetes mellitus with other specified complication: Secondary | ICD-10-CM | POA: Diagnosis not present

## 2021-06-10 DIAGNOSIS — D519 Vitamin B12 deficiency anemia, unspecified: Secondary | ICD-10-CM | POA: Diagnosis not present

## 2021-06-10 DIAGNOSIS — R5383 Other fatigue: Secondary | ICD-10-CM | POA: Diagnosis not present

## 2021-06-15 ENCOUNTER — Other Ambulatory Visit: Payer: Self-pay | Admitting: Cardiovascular Disease

## 2021-06-16 DIAGNOSIS — R5383 Other fatigue: Secondary | ICD-10-CM | POA: Diagnosis not present

## 2021-06-16 DIAGNOSIS — E782 Mixed hyperlipidemia: Secondary | ICD-10-CM | POA: Diagnosis not present

## 2021-06-16 DIAGNOSIS — E1169 Type 2 diabetes mellitus with other specified complication: Secondary | ICD-10-CM | POA: Diagnosis not present

## 2021-06-16 DIAGNOSIS — I10 Essential (primary) hypertension: Secondary | ICD-10-CM | POA: Diagnosis not present

## 2021-06-16 DIAGNOSIS — D519 Vitamin B12 deficiency anemia, unspecified: Secondary | ICD-10-CM | POA: Diagnosis not present

## 2021-06-16 DIAGNOSIS — G4710 Hypersomnia, unspecified: Secondary | ICD-10-CM | POA: Diagnosis not present

## 2021-06-16 DIAGNOSIS — D509 Iron deficiency anemia, unspecified: Secondary | ICD-10-CM | POA: Diagnosis not present

## 2021-06-16 DIAGNOSIS — N400 Enlarged prostate without lower urinary tract symptoms: Secondary | ICD-10-CM | POA: Diagnosis not present

## 2021-06-16 DIAGNOSIS — I739 Peripheral vascular disease, unspecified: Secondary | ICD-10-CM | POA: Diagnosis not present

## 2021-07-22 ENCOUNTER — Other Ambulatory Visit: Payer: Self-pay

## 2021-07-22 ENCOUNTER — Ambulatory Visit (HOSPITAL_BASED_OUTPATIENT_CLINIC_OR_DEPARTMENT_OTHER)
Admission: RE | Admit: 2021-07-22 | Discharge: 2021-07-22 | Disposition: A | Discharge status: Home / Self Care | Payer: Medicare HMO | Source: Ambulatory Visit | Attending: Cardiovascular Disease | Admitting: Cardiovascular Disease

## 2021-07-22 ENCOUNTER — Ambulatory Visit (HOSPITAL_COMMUNITY)
Admission: RE | Admit: 2021-07-22 | Discharge: 2021-07-22 | Disposition: A | Discharge status: Home / Self Care | Payer: Medicare HMO | Source: Ambulatory Visit | Attending: Cardiovascular Disease | Admitting: Cardiovascular Disease

## 2021-07-22 DIAGNOSIS — Z95828 Presence of other vascular implants and grafts: Secondary | ICD-10-CM | POA: Insufficient documentation

## 2021-07-22 DIAGNOSIS — I739 Peripheral vascular disease, unspecified: Secondary | ICD-10-CM | POA: Diagnosis not present

## 2021-07-29 DIAGNOSIS — M9903 Segmental and somatic dysfunction of lumbar region: Secondary | ICD-10-CM | POA: Diagnosis not present

## 2021-07-29 DIAGNOSIS — M9905 Segmental and somatic dysfunction of pelvic region: Secondary | ICD-10-CM | POA: Diagnosis not present

## 2021-07-29 DIAGNOSIS — M62830 Muscle spasm of back: Secondary | ICD-10-CM | POA: Diagnosis not present

## 2021-07-29 DIAGNOSIS — M546 Pain in thoracic spine: Secondary | ICD-10-CM | POA: Diagnosis not present

## 2021-07-29 DIAGNOSIS — M9902 Segmental and somatic dysfunction of thoracic region: Secondary | ICD-10-CM | POA: Diagnosis not present

## 2021-07-31 DIAGNOSIS — M62830 Muscle spasm of back: Secondary | ICD-10-CM | POA: Diagnosis not present

## 2021-07-31 DIAGNOSIS — M9903 Segmental and somatic dysfunction of lumbar region: Secondary | ICD-10-CM | POA: Diagnosis not present

## 2021-07-31 DIAGNOSIS — M9902 Segmental and somatic dysfunction of thoracic region: Secondary | ICD-10-CM | POA: Diagnosis not present

## 2021-07-31 DIAGNOSIS — M9905 Segmental and somatic dysfunction of pelvic region: Secondary | ICD-10-CM | POA: Diagnosis not present

## 2021-07-31 DIAGNOSIS — M546 Pain in thoracic spine: Secondary | ICD-10-CM | POA: Diagnosis not present

## 2021-08-18 ENCOUNTER — Encounter: Payer: Self-pay | Admitting: Podiatry

## 2021-08-18 ENCOUNTER — Other Ambulatory Visit: Payer: Self-pay

## 2021-08-18 ENCOUNTER — Ambulatory Visit: Payer: Medicare HMO | Admitting: Podiatry

## 2021-08-18 DIAGNOSIS — L84 Corns and callosities: Secondary | ICD-10-CM

## 2021-08-18 DIAGNOSIS — I739 Peripheral vascular disease, unspecified: Secondary | ICD-10-CM

## 2021-08-18 DIAGNOSIS — M79675 Pain in left toe(s): Secondary | ICD-10-CM

## 2021-08-18 DIAGNOSIS — E119 Type 2 diabetes mellitus without complications: Secondary | ICD-10-CM | POA: Diagnosis not present

## 2021-08-18 DIAGNOSIS — B351 Tinea unguium: Secondary | ICD-10-CM

## 2021-08-18 DIAGNOSIS — M79674 Pain in right toe(s): Secondary | ICD-10-CM | POA: Diagnosis not present

## 2021-08-18 NOTE — Progress Notes (Signed)
This patient returns to my office for at risk foot care.  This patient requires this care by a professional since this patient will be at risk due to having diabetes and PAD.   This patient is unable to cut nails himself since the patient cannot reach his nails.These nails are painful walking and wearing shoes.  He also has painful corn fourth toe right foot. This patient presents for at risk foot care today.  General Appearance  Alert, conversant and in no acute stress.  Vascular  Dorsalis pedis and posterior tibial  pulses are palpable  bilaterally.  Capillary return is within normal limits  bilaterally. Temperature is within normal limits  bilaterally.  Neurologic  Senn-Weinstein monofilament wire test diminished bilaterally. Muscle power within normal limits bilaterally.  Nails Thick disfigured discolored nails with subungual debris  from hallux to fifth toes bilaterally. No evidence of bacterial infection or drainage bilaterally.  Orthopedic  No limitations of motion  feet .  No crepitus or effusions noted.  No bony pathology or digital deformities noted. DJD 1st  MPJ  B/L.  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    Skin  normotropic skin with no porokeratosis noted bilaterally.  No signs of infections or ulcers noted.   Corn lateral aspect fourth toe right foot.  Asymptomatic porokeratosis sub 4th left  Onychomycosis  Pain in right toes  Pain in left toes Corn fourth toe right foot.  Consent was obtained for treatment procedures.   Mechanical debridement of nails 1-5  bilaterally performed with a nail nipper.  Filed with dremel without incident.  Debride corn with # 15 blade.    Return office visit   10 weeks                  Told patient to return for periodic foot care and evaluation due to potential at risk complications.   Gardiner Barefoot DPM

## 2021-08-26 DIAGNOSIS — N401 Enlarged prostate with lower urinary tract symptoms: Secondary | ICD-10-CM | POA: Diagnosis not present

## 2021-09-02 DIAGNOSIS — N5201 Erectile dysfunction due to arterial insufficiency: Secondary | ICD-10-CM | POA: Diagnosis not present

## 2021-09-02 DIAGNOSIS — R3912 Poor urinary stream: Secondary | ICD-10-CM | POA: Diagnosis not present

## 2021-09-02 DIAGNOSIS — N401 Enlarged prostate with lower urinary tract symptoms: Secondary | ICD-10-CM | POA: Diagnosis not present

## 2021-09-09 DIAGNOSIS — D509 Iron deficiency anemia, unspecified: Secondary | ICD-10-CM | POA: Diagnosis not present

## 2021-09-09 DIAGNOSIS — I10 Essential (primary) hypertension: Secondary | ICD-10-CM | POA: Diagnosis not present

## 2021-09-09 DIAGNOSIS — J329 Chronic sinusitis, unspecified: Secondary | ICD-10-CM | POA: Diagnosis not present

## 2021-09-09 DIAGNOSIS — G2581 Restless legs syndrome: Secondary | ICD-10-CM | POA: Diagnosis not present

## 2021-09-09 DIAGNOSIS — E119 Type 2 diabetes mellitus without complications: Secondary | ICD-10-CM | POA: Diagnosis not present

## 2021-09-09 DIAGNOSIS — R5383 Other fatigue: Secondary | ICD-10-CM | POA: Diagnosis not present

## 2021-09-19 DIAGNOSIS — R5383 Other fatigue: Secondary | ICD-10-CM | POA: Diagnosis not present

## 2021-09-19 DIAGNOSIS — R259 Unspecified abnormal involuntary movements: Secondary | ICD-10-CM | POA: Diagnosis not present

## 2021-09-19 DIAGNOSIS — D509 Iron deficiency anemia, unspecified: Secondary | ICD-10-CM | POA: Diagnosis not present

## 2021-09-19 DIAGNOSIS — F329 Major depressive disorder, single episode, unspecified: Secondary | ICD-10-CM | POA: Diagnosis not present

## 2021-09-30 DIAGNOSIS — D509 Iron deficiency anemia, unspecified: Secondary | ICD-10-CM | POA: Diagnosis not present

## 2021-09-30 DIAGNOSIS — R5383 Other fatigue: Secondary | ICD-10-CM | POA: Diagnosis not present

## 2021-09-30 DIAGNOSIS — D519 Vitamin B12 deficiency anemia, unspecified: Secondary | ICD-10-CM | POA: Diagnosis not present

## 2021-09-30 DIAGNOSIS — E538 Deficiency of other specified B group vitamins: Secondary | ICD-10-CM | POA: Diagnosis not present

## 2021-09-30 DIAGNOSIS — D513 Other dietary vitamin B12 deficiency anemia: Secondary | ICD-10-CM | POA: Diagnosis not present

## 2021-09-30 DIAGNOSIS — E782 Mixed hyperlipidemia: Secondary | ICD-10-CM | POA: Diagnosis not present

## 2021-09-30 DIAGNOSIS — I10 Essential (primary) hypertension: Secondary | ICD-10-CM | POA: Diagnosis not present

## 2021-09-30 DIAGNOSIS — I739 Peripheral vascular disease, unspecified: Secondary | ICD-10-CM | POA: Diagnosis not present

## 2021-09-30 DIAGNOSIS — N400 Enlarged prostate without lower urinary tract symptoms: Secondary | ICD-10-CM | POA: Diagnosis not present

## 2021-09-30 DIAGNOSIS — G4710 Hypersomnia, unspecified: Secondary | ICD-10-CM | POA: Diagnosis not present

## 2021-09-30 DIAGNOSIS — E1169 Type 2 diabetes mellitus with other specified complication: Secondary | ICD-10-CM | POA: Diagnosis not present

## 2021-10-05 ENCOUNTER — Inpatient Hospital Stay (HOSPITAL_COMMUNITY): Payer: Medicare HMO | Attending: Hematology | Admitting: Hematology

## 2021-10-05 ENCOUNTER — Encounter (INDEPENDENT_AMBULATORY_CARE_PROVIDER_SITE_OTHER): Payer: Self-pay | Admitting: *Deleted

## 2021-10-05 ENCOUNTER — Other Ambulatory Visit (HOSPITAL_COMMUNITY): Payer: Medicare HMO

## 2021-10-05 VITALS — BP 126/56 | HR 85 | Temp 97.6°F | Resp 18 | Ht 66.34 in | Wt 171.5 lb

## 2021-10-05 DIAGNOSIS — D509 Iron deficiency anemia, unspecified: Secondary | ICD-10-CM | POA: Insufficient documentation

## 2021-10-05 DIAGNOSIS — Z87891 Personal history of nicotine dependence: Secondary | ICD-10-CM

## 2021-10-05 DIAGNOSIS — Z122 Encounter for screening for malignant neoplasm of respiratory organs: Secondary | ICD-10-CM | POA: Diagnosis not present

## 2021-10-05 LAB — CBC WITH DIFFERENTIAL/PLATELET
Abs Immature Granulocytes: 0.03 10*3/uL (ref 0.00–0.07)
Basophils Absolute: 0 10*3/uL (ref 0.0–0.1)
Basophils Relative: 1 %
Eosinophils Absolute: 0 10*3/uL (ref 0.0–0.5)
Eosinophils Relative: 1 %
HCT: 27.3 % — ABNORMAL LOW (ref 39.0–52.0)
Hemoglobin: 7.1 g/dL — ABNORMAL LOW (ref 13.0–17.0)
Immature Granulocytes: 0 %
Lymphocytes Relative: 19 %
Lymphs Abs: 1.3 10*3/uL (ref 0.7–4.0)
MCH: 17 pg — ABNORMAL LOW (ref 26.0–34.0)
MCHC: 26 g/dL — ABNORMAL LOW (ref 30.0–36.0)
MCV: 65.5 fL — ABNORMAL LOW (ref 80.0–100.0)
Monocytes Absolute: 0.6 10*3/uL (ref 0.1–1.0)
Monocytes Relative: 9 %
Neutro Abs: 4.8 10*3/uL (ref 1.7–7.7)
Neutrophils Relative %: 70 %
Platelets: 453 10*3/uL — ABNORMAL HIGH (ref 150–400)
RBC: 4.17 MIL/uL — ABNORMAL LOW (ref 4.22–5.81)
RDW: 20.3 % — ABNORMAL HIGH (ref 11.5–15.5)
WBC: 6.7 10*3/uL (ref 4.0–10.5)
nRBC: 0.3 % — ABNORMAL HIGH (ref 0.0–0.2)

## 2021-10-05 LAB — RETICULOCYTES
Immature Retic Fract: 31.7 % — ABNORMAL HIGH (ref 2.3–15.9)
RBC.: 4.23 MIL/uL (ref 4.22–5.81)
Retic Count, Absolute: 90.1 10*3/uL (ref 19.0–186.0)
Retic Ct Pct: 2.2 % (ref 0.4–3.1)

## 2021-10-05 LAB — FERRITIN: Ferritin: 4 ng/mL — ABNORMAL LOW (ref 24–336)

## 2021-10-05 LAB — FOLATE: Folate: 21.3 ng/mL (ref >5.9)

## 2021-10-05 LAB — VITAMIN B12: Vitamin B-12: 590 pg/mL (ref 180–914)

## 2021-10-05 LAB — LACTATE DEHYDROGENASE: LDH: 113 U/L (ref 98–192)

## 2021-10-05 LAB — SAMPLE TO BLOOD BANK

## 2021-10-05 NOTE — Patient Instructions (Addendum)
[facility] at [facility] ?Discharge Instructions ? ?You were seen and examined today by Dr. Delton Coombes. Dr. Delton Coombes is a hematologist, meaning that he specializes in blood abnormalities. Dr. Delton Coombes discussed your past medical history, family history of cancers/blood conditions and the events that led to you being here today. ? ?You were referred to Dr. Delton Coombes due to anemia. Your hemoglobin was 7.3 on April 12th. This is quite low. You will most likely need a blood transfusion as well as iron infusions. ? ?Dr. Delton Coombes has recommended additional lab work today in order to identify the cause of you anemia. ? ?Please follow-up as scheduled. ? ? ?Thank you for choosing [place] [facility] at [facility] to provide your oncology and hematology care.  To afford each patient quality time with our provider, please arrive at least 15 minutes before your scheduled appointment time.  ? ?If you have a lab appointment with the [facility] please come in thru the Main Entrance and check in at the main information desk. ? ?You need to re-schedule your appointment should you arrive 10 or more minutes late.  We strive to give you quality time with our providers, and arriving late affects you and other patients whose appointments are after yours.  Also, if you no show three or more times for appointments you may be dismissed from the clinic at the providers discretion.     ?Again, thank you for choosing [facility].  Our hope is that these requests will decrease the amount of time that you wait before being seen by our physicians.       ?_____________________________________________________________ ? ?Should you have questions after your visit to [facility], please contact our office at [phone] and follow the prompts.  Our office hours are 8:00 a.m. and 4:30 p.m. Monday - Friday.  Please note that voicemails left after 4:00 p.m. may not be returned  until the following business day.  We are closed weekends and major holidays.  You do have access to a nurse 24-7, just call the main number to the clinic [phone] and do not press any options, hold on the line and a nurse will answer the phone.   ? ?For prescription refill requests, have your pharmacy contact our office and allow 72 hours.   ? ?Due to Covid, you will need to wear a mask upon entering the hospital. If you do not have a mask, a mask will be given to you at the Main Entrance upon arrival. For doctor visits, patients may have 1 support person age 70 or older with them. For treatment visits, patients can not have anyone with them due to social distancing guidelines and our immunocompromised population.  ? ? ? ?

## 2021-10-05 NOTE — Progress Notes (Signed)
? ?[facility] ?[address] ?[place], [place] [address] ? ? ?CLINIC:  ?Medical Oncology/Hematology ? ?Patient Care Team: ?Celene Squibb, MD as PCP - General (Internal Medicine) ?Troy Sine, MD as PCP - Cardiology (Cardiology) ?Derek Jack, MD as Medical Oncologist (Hematology) ? ?CHIEF COMPLAINTS/PURPOSE OF CONSULTATION:  ?Evaluation of severe iron deficiency anemia ? ?HISTORY OF PRESENTING ILLNESS:  ?Luke [name] 71 y.o. male is here because of evaluation of anemia, at the request of Dr. [name]. ? ?Today he reports feeling well, and he is accompanied by his wife. He denies light headedness, SOB, and CP. He denies history of cardiac issues. He reports increased fatigue. He denies current bleeding. He reports black stools which have been present prior to starting iron tablets; he is currently taking an iron tablet 3 times a week. He had a blood transfusion 7 years ago, and he had an iron infusion in 2020. He reports ice pica.  ? 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 He quit smoking 3-4 years ago after smoking 2 ppd for about 56 years. He is unaware of his family medical history, but he reports his brother might have had lung cancer.  ? ?MEDICAL HISTORY:  ?Past Medical History:  ?Diagnosis Date  ? CAD (coronary artery disease) 10/02/2010  ? nuclear study show normal perfusion on medical therapy without scar or ischemia. EF 64%  ? GERD (gastroesophageal reflux disease)   ? Heart murmur   ? "mild one" (12/19/2017)  ? Hematochezia 11/19/2009  ? History of blood transfusion   ? "low HgB" (12/19/2017)  ? Hyperlipidemia   ? Hypertension   ? Iron deficiency anemia 11/19/2009  ? Osteoarthritis   ? "was in my knees" (12/19/2017)  ? PAD (peripheral artery disease) ([place])   ? Recurrent pancreatitis 11/19/2009  ? Sleep apnea   ? "trial mask in the 1990s; haven't used one since" (12/19/2017)  ? Type II diabetes mellitus ([place])   ? ? ?SURGICAL HISTORY: ?Past Surgical  History:  ?Procedure Laterality Date  ? ABDOMINAL AORTOGRAM W/LOWER EXTREMITY N/A 12/19/2017  ? Procedure: ABDOMINAL AORTOGRAM W/LOWER EXTREMITY;  Surgeon: Lorretta Harp, MD;  Location: [facility];  Service: Cardiovascular;  Laterality: N/A;  ? CARDIAC CATHETERIZATION  08/1999  ? which revealed a mild coronary artery disease with 60 and 70 and 80% stenosis in a small first diagonal vessel at the LAD, 50% mid LAD stenosis, 50% ostial left circumflrx stenosis, and 40% ostial and proximal intermediate stenosis. he had 10 to 20% irregularities of his mid right coronary artery.  ? CHOLECYSTECTOMY N/A 07/08/2017  ? Procedure: LAPAROSCOPIC CHOLECYSTECTOMY;  Surgeon: Aviva Signs, MD;  [facility];  Service: General;  Laterality: N/A;  ? CIRCUMCISION  07/17/2012  ? Procedure: CIRCUMCISION ADULT;  Surgeon: Dutch Gray, MD;  [facility];  Service: Urology;  Laterality: N/A;  ? COLONOSCOPY  02/14/06  ? M JENKINS  ? COLONOSCOPY N/A 10/28/2014  ? Procedure: COLONOSCOPY;  Surgeon: Rogene Houston, MD;  [facility];  Service: Endoscopy;  Laterality: N/A;  730  ? ENTEROSCOPY N/A 10/04/2014  ? Procedure: ENTEROSCOPY;  Surgeon: Carol Ada, MD;  Location: [place];  Service: Endoscopy;  Laterality: N/A;  ? ERCP  12/25/1996  ? ROURK  ? GIVENS CAPSULE STUDY N/A 10/04/2014  ? Procedure: GIVENS CAPSULE STUDY;  Surgeon: Carol Ada, MD;  Location: [facility];  Service: Endoscopy;  Laterality: N/A;  ? GIVENS CAPSULE STUDY  N/A 08/04/2018  ? Procedure: GIVENS CAPSULE STUDY;  Surgeon: Rogene Houston, MD;  [facility];  Service: Endoscopy;  Laterality: N/A;  7:00am  ? JOINT REPLACEMENT    ? PERIPHERAL VASCULAR ATHERECTOMY Left 12/19/2017  ? Procedure: PERIPHERAL VASCULAR ATHERECTOMY;  Surgeon: Lorretta Harp, MD;  Location: [place] [facility];  Service: Cardiovascular;  Laterality: Left;  ? PERIPHERAL VASCULAR INTERVENTION Left 12/19/2017  ? Procedure: PERIPHERAL VASCULAR INTERVENTION;  Surgeon:  Lorretta Harp, MD;  Location: [facility];  Service: Cardiovascular;  Laterality: Left;  stent  ? SMALL BOWEL GIVENS  11/25/2008  ? TOTAL KNEE ARTHROPLASTY  2007-2008  ? UPPER GASTROINTESTINAL ENDOSCOPY  11/08/2008  ? EGD TCS  ? ? ?SOCIAL HISTORY: ?Social History  ? ?Socioeconomic History  ? Marital status: Married  ?  Spouse name: Not on file  ? Number of children: Not on file  ? Years of education: Not on file  ? Highest education level: Not on file  ?Occupational History  ? Not on file  ?Tobacco Use  ? Smoking status: Former  ?  Packs/day: 2.00  ?  Years: 50.00  ?  Pack years: 100.00  ?  Types: Cigarettes  ?  Quit date: 09/28/2014  ?  Years since quitting: 7.0  ? Smokeless tobacco: Never  ?Vaping Use  ? Vaping Use: Never used  ?Substance and Sexual Activity  ? Alcohol use: Never  ?  Alcohol/week: 0.0 standard drinks  ? Drug use: Never  ? Sexual activity: Not Currently  ?Other Topics Concern  ? Not on file  ?Social History Narrative  ? Not on file  ? ?Social Determinants of Health  ? ?Financial Resource Strain: Not on file  ?Food Insecurity: Not on file  ?Transportation Needs: Not on file  ?Physical Activity: Not on file  ?Stress: Not on file  ?Social Connections: Not on file  ?Intimate Partner Violence: Not on file  ? ? ?FAMILY HISTORY: ?Family History  ?Problem Relation Age of Onset  ? Healthy Sister   ? Healthy Brother   ? Healthy Sister   ? Healthy Brother   ? Healthy Brother   ? Healthy Daughter   ? Healthy Daughter   ? ? ?ALLERGIES:  is allergic to morphine and related. ? ?MEDICATIONS:  ?Current Outpatient Medications  ?Medication Sig Dispense Refill  ? amLODipine (NORVASC) 10 MG tablet TAKE 1 TABLET EVERY DAY 90 tablet 3  ? aspirin 81 MG tablet Take 1 tablet (81 mg total) by mouth daily. HOLD FOR 1 WEEK, THEN RESUME IF NO BLEEDING PROBLEMS 30 tablet   ? atorvastatin (LIPITOR) 80 MG tablet TAKE 1 TABLET EVERY DAY 90 tablet 4  ? Cyanocobalamin (B-12) 1000 MCG TABS     ? empagliflozin (JARDIANCE) 25  MG TABS tablet Take 25 mg by mouth daily.     ? Ferrous Sulfate Dried (HIGH POTENCY IRON) 65 MG TABS     ? finasteride (PROSCAR) 5 MG tablet Take 5 mg by mouth daily.    ? Insulin Degludec-Liraglutide (XULTOPHY) 100-3.6 UNIT-MG/ML SOPN Inject 24 Units into the skin daily.     ? levocetirizine (XYZAL) 5 MG tablet levocetirizine 5 mg tablet    ? metFORMIN (GLUCOPHAGE-XR) 500 MG 24 hr tablet     ? pantoprazole (PROTONIX) 40 MG tablet TAKE 1 TABLET EVERY DAY 90 tablet 3  ? ramipril (ALTACE) 10 MG capsule TAKE 1 CAPSULE EVERY DAY 90 capsule 0  ? rOPINIRole (REQUIP) 0.25 MG tablet ropinirole 0.25 mg tablet    ?  tamsulosin (FLOMAX) 0.4 MG CAPS capsule Take 0.4 mg by mouth.    ? ?No current facility-administered medications for this visit.  ? ? ?REVIEW OF SYSTEMS:   ?Review of Systems  ?Constitutional:  Positive for fatigue. Negative for appetite change.  ?HENT:   Negative for nosebleeds.   ?Respiratory:  Negative for hemoptysis and shortness of breath.   ?Cardiovascular:  Negative for chest pain.  ?Gastrointestinal:  Negative for blood in stool.  ?Genitourinary:  Negative for hematuria.   ?Musculoskeletal:  Positive for back pain (4/10).  ?     Black stools  ?Neurological:  Positive for numbness. Negative for light-headedness.  ?Hematological:  Does not bruise/bleed easily.  ?Psychiatric/Behavioral:  Positive for sleep disturbance.   ?All other systems reviewed and are negative. ? ? ?PHYSICAL EXAMINATION: ?ECOG PERFORMANCE STATUS: 1 - Symptomatic but completely ambulatory ? ?Vitals:  ? 10/05/21 1501  ?BP: (!) 126/56  ?Pulse: 85  ?Resp: 18  ?Temp: 97.6 ?F (36.4 ?C)  ?SpO2: 100%  ? ?Filed Weights  ? 10/05/21 1501  ?Weight: 171 lb 8.3 oz (77.8 kg)  ? ?Physical Exam ?Vitals reviewed.  ?Constitutional:   ?   Appearance: Normal appearance.  ?Cardiovascular:  ?   Rate and Rhythm: Normal rate and regular rhythm.  ?   Pulses: Normal pulses.  ?   Heart sounds: Normal heart sounds.  ?Pulmonary:  ?   Effort: Pulmonary effort is  normal.  ?   Breath sounds: Normal breath sounds.  ?Abdominal:  ?   Palpations: Abdomen is soft. There is no hepatomegaly, splenomegaly or mass.  ?   Tenderness: There is no abdominal tenderness.  ?Musculoskeletal:

## 2021-10-06 ENCOUNTER — Other Ambulatory Visit (HOSPITAL_COMMUNITY): Payer: Self-pay

## 2021-10-06 DIAGNOSIS — D509 Iron deficiency anemia, unspecified: Secondary | ICD-10-CM

## 2021-10-06 LAB — PROTEIN ELECTROPHORESIS, SERUM
A/G Ratio: 1.3 (ref 0.7–1.7)
Albumin ELP: 3.7 g/dL (ref 2.9–4.4)
Alpha-1-Globulin: 0.2 g/dL (ref 0.0–0.4)
Alpha-2-Globulin: 0.8 g/dL (ref 0.4–1.0)
Beta Globulin: 1 g/dL (ref 0.7–1.3)
Gamma Globulin: 0.8 g/dL (ref 0.4–1.8)
Globulin, Total: 2.9 g/dL (ref 2.2–3.9)
Total Protein ELP: 6.6 g/dL (ref 6.0–8.5)

## 2021-10-06 LAB — COPPER, SERUM: Copper: 96 ug/dL (ref 69–132)

## 2021-10-06 LAB — PREPARE RBC (CROSSMATCH)

## 2021-10-06 NOTE — Progress Notes (Signed)
Sign and held orders placed for 1 unit PRBCs per Dr. Delton Coombes. Patient scheduled for transfusion on 10/07/2021. ?

## 2021-10-07 ENCOUNTER — Inpatient Hospital Stay (HOSPITAL_COMMUNITY): Payer: Medicare HMO

## 2021-10-07 ENCOUNTER — Encounter (HOSPITAL_COMMUNITY): Payer: Self-pay

## 2021-10-07 ENCOUNTER — Encounter (HOSPITAL_COMMUNITY): Payer: Medicare HMO

## 2021-10-07 DIAGNOSIS — K5520 Angiodysplasia of colon without hemorrhage: Secondary | ICD-10-CM | POA: Diagnosis not present

## 2021-10-07 DIAGNOSIS — D509 Iron deficiency anemia, unspecified: Secondary | ICD-10-CM | POA: Diagnosis not present

## 2021-10-07 DIAGNOSIS — K921 Melena: Secondary | ICD-10-CM | POA: Diagnosis not present

## 2021-10-07 MED ORDER — SODIUM CHLORIDE 0.9% IV SOLUTION
250.0000 mL | Freq: Once | INTRAVENOUS | Status: AC
  Administered 2021-10-07: 250 mL via INTRAVENOUS

## 2021-10-07 MED ORDER — SODIUM CHLORIDE 0.9% FLUSH: 10.0000 mL | INTRAVENOUS | Status: DC | PRN

## 2021-10-07 MED ORDER — DIPHENHYDRAMINE HCL 25 MG PO CAPS
25.0000 mg | ORAL_CAPSULE | Freq: Once | ORAL | Status: AC
  Administered 2021-10-07: 25 mg via ORAL
  Filled 2021-10-07: qty 1

## 2021-10-07 MED ORDER — ACETAMINOPHEN 325 MG PO TABS
650.0000 mg | ORAL_TABLET | Freq: Once | ORAL | Status: AC
  Administered 2021-10-07: 650 mg via ORAL
  Filled 2021-10-07: qty 2

## 2021-10-07 NOTE — Patient Instructions (Signed)
[facility]  Discharge Instructions: ?Thank you for choosing [facility] to provide your oncology and hematology care.  ?If you have a lab appointment with the [facility], please come in thru the Main Entrance and check in at the main information desk. ? ?Wear comfortable clothing and clothing appropriate for easy access to any Portacath or PICC line.  ? ?We strive to give you quality time with your provider. You may need to reschedule your appointment if you arrive late (15 or more minutes).  Arriving late affects you and other patients whose appointments are after yours.  Also, if you miss three or more appointments without notifying the office, you may be dismissed from the clinic at the provider?s discretion.    ?  ?For prescription refill requests, have your pharmacy contact our office and allow 72 hours for refills to be completed.   ? ?Today you received the following : 1 unit of PRBC's.     ?  ?To help prevent nausea and vomiting after your treatment, we encourage you to take your nausea medication as directed. ? ?BELOW ARE SYMPTOMS THAT SHOULD BE REPORTED IMMEDIATELY: ?*FEVER GREATER THAN 100.4 F (38 ?C) OR HIGHER ?*CHILLS OR SWEATING ?*NAUSEA AND VOMITING THAT IS NOT CONTROLLED WITH YOUR NAUSEA MEDICATION ?*UNUSUAL SHORTNESS OF BREATH ?*UNUSUAL BRUISING OR BLEEDING ?*URINARY PROBLEMS (pain or burning when urinating, or frequent urination) ?*BOWEL PROBLEMS (unusual diarrhea, constipation, pain near the anus) ?TENDERNESS IN MOUTH AND THROAT WITH OR WITHOUT PRESENCE OF ULCERS (sore throat, sores in mouth, or a toothache) ?UNUSUAL RASH, SWELLING OR PAIN  ?UNUSUAL VAGINAL DISCHARGE OR ITCHING  ? ?Items with * indicate a potential emergency and should be followed up as soon as possible or go to the Emergency Department if any problems should occur. ? ?Please show the CHEMOTHERAPY ALERT CARD or IMMUNOTHERAPY ALERT CARD at check-in to the Emergency Department and triage  nurse. ? ?Should you have questions after your visit or need to cancel or reschedule your appointment, please contact [facility] [phone]  and follow the prompts.  Office hours are 8:00 a.m. to 4:30 p.m. Monday - Friday. Please note that voicemails left after 4:00 p.m. may not be returned until the following business day.  We are closed weekends and major holidays. You have access to a nurse at all times for urgent questions. Please call the main number to the clinic [phone] and follow the prompts. ? ?For any non-urgent questions, you may also contact your provider using MyChart. We now offer e-Visits for anyone 72 and older to request care online for non-urgent symptoms. For details visit [email or web address]. ?  ?Also download the MyChart app! Go to the app store, search "MyChart", open the app, select [name], and log in with your MyChart username and password. ? ?Due to Covid, a mask is required upon entering the hospital/clinic. If you do not have a mask, one will be given to you upon arrival. For doctor visits, patients may have 1 support person aged 72 or older with them. For treatment visits, patients cannot have anyone with them due to current Covid guidelines and our immunocompromised population.  ?

## 2021-10-07 NOTE — Progress Notes (Signed)
Patient presents today for 1 unit of PRBC's. Vitals signs stable. Patient has no complaints today. MAR reviewed and updated. Blood consent obtained today and attestation.  ? ?1 unit of blood given today per MD orders. Tolerated infusion without adverse affects. Vital signs stable. No complaints at this time. Discharged from clinic ambulatory in stable condition. Alert and oriented x 3. F/U with [facility] as scheduled.   ?

## 2021-10-08 ENCOUNTER — Inpatient Hospital Stay (HOSPITAL_COMMUNITY): Payer: Medicare HMO

## 2021-10-08 VITALS — BP 133/70 | HR 70 | Temp 97.1°F | Resp 18

## 2021-10-08 DIAGNOSIS — D509 Iron deficiency anemia, unspecified: Secondary | ICD-10-CM | POA: Diagnosis not present

## 2021-10-08 LAB — TYPE AND SCREEN
ABO/RH(D): A POS
Antibody Screen: NEGATIVE
Unit division: 0

## 2021-10-08 LAB — BPAM RBC
Blood Product Expiration Date: 202305182359
ISSUE DATE / TIME: 202304191336
Unit Type and Rh: 6200

## 2021-10-08 LAB — METHYLMALONIC ACID, SERUM: Methylmalonic Acid, Quantitative: 180 nmol/L (ref 0–378)

## 2021-10-08 MED ORDER — SODIUM CHLORIDE 0.9 % IV SOLN
300.0000 mg | Freq: Once | INTRAVENOUS | Status: AC
  Administered 2021-10-08: 300 mg via INTRAVENOUS
  Filled 2021-10-08: qty 10

## 2021-10-08 MED ORDER — SODIUM CHLORIDE 0.9 % IV SOLN: Freq: Once | INTRAVENOUS | Status: AC

## 2021-10-08 MED ORDER — ACETAMINOPHEN 325 MG PO TABS
650.0000 mg | ORAL_TABLET | Freq: Once | ORAL | Status: AC
  Administered 2021-10-08: 650 mg via ORAL
  Filled 2021-10-08: qty 2

## 2021-10-08 MED ORDER — LORATADINE 10 MG PO TABS
10.0000 mg | ORAL_TABLET | Freq: Once | ORAL | Status: AC
  Administered 2021-10-08: 10 mg via ORAL
  Filled 2021-10-08: qty 1

## 2021-10-08 NOTE — Progress Notes (Signed)
Patient presents today for Venofer infusion per providers order.  Vital signs WNL.  Patient has no new complaints at this time.  Peripheral IV started and blood return noted pre and post infusion.  Venofer given today per MD orders.  Stable during infusion without adverse affects.  Vital signs stable.  No complaints at this time.  Discharge from clinic ambulatory in stable condition.  Alert and oriented X 3.  Follow up with [facility] as scheduled.  

## 2021-10-08 NOTE — Patient Instructions (Signed)
[facility]  Discharge Instructions: Thank you for choosing [facility] to provide your oncology and hematology care.  If you have a lab appointment with the Cancer Center, please come in thru the Main Entrance and check in at the main information desk.  Wear comfortable clothing and clothing appropriate for easy access to any Portacath or PICC line.   We strive to give you quality time with your provider. You may need to reschedule your appointment if you arrive late (15 or more minutes).  Arriving late affects you and other patients whose appointments are after yours.  Also, if you miss three or more appointments without notifying the office, you may be dismissed from the clinic at the provider's discretion.      For prescription refill requests, have your pharmacy contact our office and allow 72 hours for refills to be completed.    Today you received the following chemotherapy and/or immunotherapy agents Venofer      To help prevent nausea and vomiting after your treatment, we encourage you to take your nausea medication as directed.  BELOW ARE SYMPTOMS THAT SHOULD BE REPORTED IMMEDIATELY: *FEVER GREATER THAN 100.4 F (38 C) OR HIGHER *CHILLS OR SWEATING *NAUSEA AND VOMITING THAT IS NOT CONTROLLED WITH YOUR NAUSEA MEDICATION *UNUSUAL SHORTNESS OF BREATH *UNUSUAL BRUISING OR BLEEDING *URINARY PROBLEMS (pain or burning when urinating, or frequent urination) *BOWEL PROBLEMS (unusual diarrhea, constipation, pain near the anus) TENDERNESS IN MOUTH AND THROAT WITH OR WITHOUT PRESENCE OF ULCERS (sore throat, sores in mouth, or a toothache) UNUSUAL RASH, SWELLING OR PAIN  UNUSUAL VAGINAL DISCHARGE OR ITCHING   Items with * indicate a potential emergency and should be followed up as soon as possible or go to the Emergency Department if any problems should occur.  Please show the CHEMOTHERAPY ALERT CARD or IMMUNOTHERAPY ALERT CARD at check-in to the Emergency  Department and triage nurse.  Should you have questions after your visit or need to cancel or reschedule your appointment, please contact [facility] [phone]  and follow the prompts.  Office hours are 8:00 a.m. to 4:30 p.m. Monday - Friday. Please note that voicemails left after 4:00 p.m. may not be returned until the following business day.  We are closed weekends and major holidays. You have access to a nurse at all times for urgent questions. Please call the main number to the clinic [phone] and follow the prompts.  For any non-urgent questions, you may also contact your provider using MyChart. We now offer e-Visits for anyone 18 and older to request care online for non-urgent symptoms. For details visit [email or web address].   Also download the MyChart app! Go to the app store, search "MyChart", open the app, select [place], and log in with your MyChart username and password.  Due to Covid, a mask is required upon entering the hospital/clinic. If you do not have a mask, one will be given to you upon arrival. For doctor visits, patients may have 1 support person aged 18 or older with them. For treatment visits, patients cannot have anyone with them due to current Covid guidelines and our immunocompromised population.  

## 2021-10-14 ENCOUNTER — Ambulatory Visit (HOSPITAL_COMMUNITY): Payer: Medicare HMO

## 2021-10-14 DIAGNOSIS — K5140 Inflammatory polyps of colon without complications: Secondary | ICD-10-CM | POA: Diagnosis not present

## 2021-10-14 DIAGNOSIS — D509 Iron deficiency anemia, unspecified: Secondary | ICD-10-CM | POA: Diagnosis not present

## 2021-10-14 DIAGNOSIS — K5730 Diverticulosis of large intestine without perforation or abscess without bleeding: Secondary | ICD-10-CM | POA: Diagnosis not present

## 2021-10-14 DIAGNOSIS — K635 Polyp of colon: Secondary | ICD-10-CM | POA: Diagnosis not present

## 2021-10-15 ENCOUNTER — Inpatient Hospital Stay (HOSPITAL_COMMUNITY): Payer: Medicare HMO

## 2021-10-15 VITALS — BP 150/64 | HR 70 | Temp 97.6°F | Resp 18

## 2021-10-15 DIAGNOSIS — D509 Iron deficiency anemia, unspecified: Secondary | ICD-10-CM

## 2021-10-15 MED ORDER — SODIUM CHLORIDE 0.9 % IV SOLN
300.0000 mg | Freq: Once | INTRAVENOUS | Status: AC
  Administered 2021-10-15: 300 mg via INTRAVENOUS
  Filled 2021-10-15: qty 300

## 2021-10-15 MED ORDER — LORATADINE 10 MG PO TABS
10.0000 mg | ORAL_TABLET | Freq: Once | ORAL | Status: AC
  Administered 2021-10-15: 10 mg via ORAL
  Filled 2021-10-15: qty 1

## 2021-10-15 MED ORDER — ACETAMINOPHEN 325 MG PO TABS
650.0000 mg | ORAL_TABLET | Freq: Once | ORAL | Status: AC
  Administered 2021-10-15: 650 mg via ORAL
  Filled 2021-10-15: qty 2

## 2021-10-15 MED ORDER — SODIUM CHLORIDE 0.9 % IV SOLN: Freq: Once | INTRAVENOUS | Status: AC

## 2021-10-15 NOTE — Patient Instructions (Signed)
[facility]  Discharge Instructions: Thank you for choosing [facility] to provide your oncology and hematology care.  If you have a lab appointment with the Cancer Center, please come in thru the Main Entrance and check in at the main information desk.  Wear comfortable clothing and clothing appropriate for easy access to any Portacath or PICC line.   We strive to give you quality time with your provider. You may need to reschedule your appointment if you arrive late (15 or more minutes).  Arriving late affects you and other patients whose appointments are after yours.  Also, if you miss three or more appointments without notifying the office, you may be dismissed from the clinic at the provider's discretion.      For prescription refill requests, have your pharmacy contact our office and allow 72 hours for refills to be completed.    Today you received the following: Venofer, return as scheduled.   To help prevent nausea and vomiting after your treatment, we encourage you to take your nausea medication as directed.  BELOW ARE SYMPTOMS THAT SHOULD BE REPORTED IMMEDIATELY: *FEVER GREATER THAN 100.4 F (38 C) OR HIGHER *CHILLS OR SWEATING *NAUSEA AND VOMITING THAT IS NOT CONTROLLED WITH YOUR NAUSEA MEDICATION *UNUSUAL SHORTNESS OF BREATH *UNUSUAL BRUISING OR BLEEDING *URINARY PROBLEMS (pain or burning when urinating, or frequent urination) *BOWEL PROBLEMS (unusual diarrhea, constipation, pain near the anus) TENDERNESS IN MOUTH AND THROAT WITH OR WITHOUT PRESENCE OF ULCERS (sore throat, sores in mouth, or a toothache) UNUSUAL RASH, SWELLING OR PAIN  UNUSUAL VAGINAL DISCHARGE OR ITCHING   Items with * indicate a potential emergency and should be followed up as soon as possible or go to the Emergency Department if any problems should occur.  Please show the CHEMOTHERAPY ALERT CARD or IMMUNOTHERAPY ALERT CARD at check-in to the Emergency Department and triage  nurse.  Should you have questions after your visit or need to cancel or reschedule your appointment, please contact  CANCER CENTER [phone]  and follow the prompts.  Office hours are 8:00 a.m. to 4:30 p.m. Monday - Friday. Please note that voicemails left after 4:00 p.m. may not be returned until the following business day.  We are closed weekends and major holidays. You have access to a nurse at all times for urgent questions. Please call the main number to the clinic [phone] and follow the prompts.  For any non-urgent questions, you may also contact your provider using MyChart. We now offer e-Visits for anyone 18 and older to request care online for non-urgent symptoms. For details visit [email or web address].   Also download the MyChart app! Go to the app store, search "MyChart", open the app, select [place], and log in with your MyChart username and password.  Due to Covid, a mask is required upon entering the hospital/clinic. If you do not have a mask, one will be given to you upon arrival. For doctor visits, patients may have 1 support person aged 18 or older with them. For treatment visits, patients cannot have anyone with them due to current Covid guidelines and our immunocompromised population.  

## 2021-10-15 NOTE — Progress Notes (Signed)
Patient tolerated iron infusion with no complaints voiced.  Peripheral IV site clean and dry with good blood return noted before and after infusion.  Band aid applied.  VSS with discharge and left in satisfactory condition with no s/s of distress noted.   

## 2021-10-20 DIAGNOSIS — D225 Melanocytic nevi of trunk: Secondary | ICD-10-CM | POA: Diagnosis not present

## 2021-10-20 DIAGNOSIS — D1801 Hemangioma of skin and subcutaneous tissue: Secondary | ICD-10-CM | POA: Diagnosis not present

## 2021-10-20 DIAGNOSIS — D692 Other nonthrombocytopenic purpura: Secondary | ICD-10-CM | POA: Diagnosis not present

## 2021-10-20 DIAGNOSIS — L400 Psoriasis vulgaris: Secondary | ICD-10-CM | POA: Diagnosis not present

## 2021-10-20 DIAGNOSIS — L570 Actinic keratosis: Secondary | ICD-10-CM | POA: Diagnosis not present

## 2021-10-20 DIAGNOSIS — D2261 Melanocytic nevi of right upper limb, including shoulder: Secondary | ICD-10-CM | POA: Diagnosis not present

## 2021-10-20 DIAGNOSIS — D2262 Melanocytic nevi of left upper limb, including shoulder: Secondary | ICD-10-CM | POA: Diagnosis not present

## 2021-10-22 ENCOUNTER — Encounter (HOSPITAL_COMMUNITY): Payer: Self-pay

## 2021-10-22 ENCOUNTER — Inpatient Hospital Stay (HOSPITAL_COMMUNITY): Payer: Medicare HMO | Attending: Hematology

## 2021-10-22 VITALS — BP 147/67 | HR 77 | Temp 98.2°F | Resp 18

## 2021-10-22 DIAGNOSIS — D509 Iron deficiency anemia, unspecified: Secondary | ICD-10-CM | POA: Insufficient documentation

## 2021-10-22 DIAGNOSIS — Z79899 Other long term (current) drug therapy: Secondary | ICD-10-CM | POA: Insufficient documentation

## 2021-10-22 MED ORDER — ACETAMINOPHEN 325 MG PO TABS
650.0000 mg | ORAL_TABLET | Freq: Once | ORAL | Status: AC
  Administered 2021-10-22: 650 mg via ORAL
  Filled 2021-10-22: qty 2

## 2021-10-22 MED ORDER — SODIUM CHLORIDE 0.9 % IV SOLN
400.0000 mg | Freq: Once | INTRAVENOUS | Status: AC
  Administered 2021-10-22: 400 mg via INTRAVENOUS
  Filled 2021-10-22: qty 20

## 2021-10-22 MED ORDER — LORATADINE 10 MG PO TABS
10.0000 mg | ORAL_TABLET | Freq: Once | ORAL | Status: AC
  Administered 2021-10-22: 10 mg via ORAL
  Filled 2021-10-22: qty 1

## 2021-10-22 MED ORDER — SODIUM CHLORIDE 0.9 % IV SOLN: Freq: Once | INTRAVENOUS | Status: AC

## 2021-10-22 NOTE — Patient Instructions (Signed)
[facility]  Discharge Instructions: Thank you for choosing [facility] to provide your oncology and hematology care.  If you have a lab appointment with the Cancer Center, please come in thru the Main Entrance and check in at the main information desk.  Wear comfortable clothing and clothing appropriate for easy access to any Portacath or PICC line.   We strive to give you quality time with your provider. You may need to reschedule your appointment if you arrive late (15 or more minutes).  Arriving late affects you and other patients whose appointments are after yours.  Also, if you miss three or more appointments without notifying the office, you may be dismissed from the clinic at the provider's discretion.      For prescription refill requests, have your pharmacy contact our office and allow 72 hours for refills to be completed.    Today you received the following: Venofer, return as scheduled.   To help prevent nausea and vomiting after your treatment, we encourage you to take your nausea medication as directed.  BELOW ARE SYMPTOMS THAT SHOULD BE REPORTED IMMEDIATELY: *FEVER GREATER THAN 100.4 F (38 C) OR HIGHER *CHILLS OR SWEATING *NAUSEA AND VOMITING THAT IS NOT CONTROLLED WITH YOUR NAUSEA MEDICATION *UNUSUAL SHORTNESS OF BREATH *UNUSUAL BRUISING OR BLEEDING *URINARY PROBLEMS (pain or burning when urinating, or frequent urination) *BOWEL PROBLEMS (unusual diarrhea, constipation, pain near the anus) TENDERNESS IN MOUTH AND THROAT WITH OR WITHOUT PRESENCE OF ULCERS (sore throat, sores in mouth, or a toothache) UNUSUAL RASH, SWELLING OR PAIN  UNUSUAL VAGINAL DISCHARGE OR ITCHING   Items with * indicate a potential emergency and should be followed up as soon as possible or go to the Emergency Department if any problems should occur.  Please show the CHEMOTHERAPY ALERT CARD or IMMUNOTHERAPY ALERT CARD at check-in to the Emergency Department and triage  nurse.  Should you have questions after your visit or need to cancel or reschedule your appointment, please contact [facility] [phone]  and follow the prompts.  Office hours are 8:00 a.m. to 4:30 p.m. Monday - Friday. Please note that voicemails left after 4:00 p.m. may not be returned until the following business day.  We are closed weekends and major holidays. You have access to a nurse at all times for urgent questions. Please call the main number to the clinic [phone] and follow the prompts.  For any non-urgent questions, you may also contact your provider using MyChart. We now offer e-Visits for anyone 18 and older to request care online for non-urgent symptoms. For details visit [email or web address].   Also download the MyChart app! Go to the app store, search "MyChart", open the app, select [place], and log in with your MyChart username and password.  Due to Covid, a mask is required upon entering the hospital/clinic. If you do not have a mask, one will be given to you upon arrival. For doctor visits, patients may have 1 support person aged 18 or older with them. For treatment visits, patients cannot have anyone with them due to current Covid guidelines and our immunocompromised population.  

## 2021-10-22 NOTE — Progress Notes (Signed)
Patient tolerated iron infusion with no complaints voiced.  Peripheral IV site clean and dry with good blood return noted before and after infusion.  Band aid applied.  VSS with discharge and left in satisfactory condition with no s/s of distress noted.   

## 2021-10-23 ENCOUNTER — Ambulatory Visit (HOSPITAL_COMMUNITY): Payer: Medicare HMO

## 2021-10-27 ENCOUNTER — Ambulatory Visit: Payer: Medicare HMO | Admitting: Podiatry

## 2021-10-27 ENCOUNTER — Encounter: Payer: Self-pay | Admitting: Podiatry

## 2021-10-27 DIAGNOSIS — M79675 Pain in left toe(s): Secondary | ICD-10-CM

## 2021-10-27 DIAGNOSIS — L84 Corns and callosities: Secondary | ICD-10-CM | POA: Diagnosis not present

## 2021-10-27 DIAGNOSIS — E119 Type 2 diabetes mellitus without complications: Secondary | ICD-10-CM | POA: Diagnosis not present

## 2021-10-27 DIAGNOSIS — M79674 Pain in right toe(s): Secondary | ICD-10-CM

## 2021-10-27 DIAGNOSIS — I739 Peripheral vascular disease, unspecified: Secondary | ICD-10-CM | POA: Diagnosis not present

## 2021-10-27 DIAGNOSIS — B351 Tinea unguium: Secondary | ICD-10-CM

## 2021-10-27 DIAGNOSIS — M205X1 Other deformities of toe(s) (acquired), right foot: Secondary | ICD-10-CM

## 2021-10-27 DIAGNOSIS — M2041 Other hammer toe(s) (acquired), right foot: Secondary | ICD-10-CM

## 2021-10-27 NOTE — Progress Notes (Signed)
This patient returns to my office for at risk foot care.  This patient requires this care by a professional since this patient will be at risk due to having diabetes and PAD.   This patient is unable to cut nails himself since the patient cannot reach his nails.These nails are painful walking and wearing shoes.  He also has painful corn fourth toe right foot. This patient presents for at risk foot care today. ? ?General Appearance  Alert, conversant and in no acute stress. ? ?Vascular  Dorsalis pedis and posterior tibial  pulses are palpable  bilaterally.  Capillary return is within normal limits  bilaterally. Temperature is within normal limits  bilaterally. ? ?Neurologic  Senn-Weinstein monofilament wire test diminished bilaterally. Muscle power within normal limits bilaterally. ? ?Nails Thick disfigured discolored nails with subungual debris  from hallux to fifth toes bilaterally. No evidence of bacterial infection or drainage bilaterally. ? ?Orthopedic  No limitations of motion  feet .  No crepitus or effusions noted.  No bony pathology or digital deformities noted. DJD 1st  MPJ  B/L.  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   ? ?Skin  normotropic skin with no porokeratosis noted bilaterally.  No signs of infections or ulcers noted.   Corn lateral aspect fourth toe right foot.  Asymptomatic porokeratosis sub 4th left ? ?Onychomycosis  Pain in right toes  Pain in left toes Corn fourth toe right foot. ? ?Consent was obtained for treatment procedures.   Mechanical debridement of nails 1-5  bilaterally performed with a nail nipper.  Filed with dremel without incident.  Debride corn with # 15 blade. Patient requests diabetic shoes. ? ? ?Return office visit   10 weeks                  Told patient to return for periodic foot care and evaluation due to potential at risk complications. ? ? ?Gardiner Barefoot DPM  ?

## 2021-10-29 ENCOUNTER — Telehealth: Payer: Self-pay

## 2021-10-29 NOTE — Telephone Encounter (Signed)
CMN Submitted ?

## 2021-11-10 ENCOUNTER — Other Ambulatory Visit (HOSPITAL_COMMUNITY): Payer: Self-pay | Admitting: *Deleted

## 2021-11-10 DIAGNOSIS — D509 Iron deficiency anemia, unspecified: Secondary | ICD-10-CM

## 2021-11-11 ENCOUNTER — Inpatient Hospital Stay (HOSPITAL_COMMUNITY): Payer: Medicare HMO

## 2021-11-11 DIAGNOSIS — Z79899 Other long term (current) drug therapy: Secondary | ICD-10-CM | POA: Diagnosis not present

## 2021-11-11 DIAGNOSIS — D509 Iron deficiency anemia, unspecified: Secondary | ICD-10-CM

## 2021-11-11 LAB — CBC WITH DIFFERENTIAL/PLATELET
Abs Immature Granulocytes: 0.04 10*3/uL (ref 0.00–0.07)
Basophils Absolute: 0.1 10*3/uL (ref 0.0–0.1)
Basophils Relative: 1 %
Eosinophils Absolute: 0.1 10*3/uL (ref 0.0–0.5)
Eosinophils Relative: 2 %
HCT: 42.8 % (ref 39.0–52.0)
Hemoglobin: 12 g/dL — ABNORMAL LOW (ref 13.0–17.0)
Immature Granulocytes: 1 %
Lymphocytes Relative: 14 %
Lymphs Abs: 1.1 10*3/uL (ref 0.7–4.0)
MCH: 22.4 pg — ABNORMAL LOW (ref 26.0–34.0)
MCHC: 28 g/dL — ABNORMAL LOW (ref 30.0–36.0)
MCV: 79.9 fL — ABNORMAL LOW (ref 80.0–100.0)
Monocytes Absolute: 0.5 10*3/uL (ref 0.1–1.0)
Monocytes Relative: 7 %
Neutro Abs: 5.7 10*3/uL (ref 1.7–7.7)
Neutrophils Relative %: 75 %
Platelets: 309 10*3/uL (ref 150–400)
RBC: 5.36 MIL/uL (ref 4.22–5.81)
WBC: 7.5 10*3/uL (ref 4.0–10.5)
nRBC: 0 % (ref 0.0–0.2)

## 2021-11-11 LAB — IRON AND TIBC
Iron: 50 ug/dL (ref 45–182)
Saturation Ratios: 13 % — ABNORMAL LOW (ref 17.9–39.5)
TIBC: 388 ug/dL (ref 250–450)
UIBC: 338 ug/dL

## 2021-11-11 LAB — FERRITIN: Ferritin: 37 ng/mL (ref 24–336)

## 2021-11-13 ENCOUNTER — Ambulatory Visit (HOSPITAL_COMMUNITY)
Admission: RE | Admit: 2021-11-13 | Discharge: 2021-11-13 | Disposition: A | Discharge status: Home / Self Care | Payer: Medicare HMO | Source: Ambulatory Visit | Attending: Hematology | Admitting: Hematology

## 2021-11-13 DIAGNOSIS — Z87891 Personal history of nicotine dependence: Secondary | ICD-10-CM | POA: Diagnosis not present

## 2021-11-13 DIAGNOSIS — Z122 Encounter for screening for malignant neoplasm of respiratory organs: Secondary | ICD-10-CM | POA: Diagnosis not present

## 2021-11-17 DIAGNOSIS — R69 Illness, unspecified: Secondary | ICD-10-CM | POA: Diagnosis not present

## 2021-11-17 NOTE — Progress Notes (Unsigned)
[facility] [place], [place] [address]   CLINIC:  Medical Oncology/Hematology  PCP:  Celene Squibb, MD [place] [address] [phone]   REASON FOR VISIT:  Follow-up for iron deficiency anemia  PRIOR THERAPY: Oral iron  CURRENT THERAPY: Intermittent PRBCs transfusions & IV iron infusions  INTERVAL HISTORY:  Luke [name] 71 y.o. male returns for routine follow-up of his iron deficiency anemia.  He was seen by Dr. Delton Coombes on 10/05/2021.  He received Venofer 1000 mg in divided doses from 10/08/2021 through 10/22/2021.  He received PRBC transfusion x1 on 10/06/2021.    At today's visit, he reports feeling much better following his blood transfusion and iron infusions.  His energy has improved and his restless legs and pica symptoms have resolved.  He previously was having black stools that were present even before starting iron pills.  He is currently taking iron pill daily, but denies any black bowel movements at this time.  No bright red blood per rectum, melena, or epistaxis since his last visit.  He denies any chest pain, dyspnea on exertion, lightheadedness, or syncope.  He continues to follow with Dr. Benson Norway (gastroenterology) of [facility].  Patient reports that he had EGD and colonoscopy around 10/14/2021 which were reportedly normal.  He is scheduled to see Dr. Benson Norway again in 3 months.  He has 100% energy and 100% appetite. He endorses that he is maintaining a stable weight.   REVIEW OF SYSTEMS:  Review of Systems  Constitutional:  Negative for appetite change, chills, diaphoresis, fatigue, fever and unexpected weight change.  HENT:   Negative for lump/mass and nosebleeds.   Eyes:  Negative for eye problems.  Respiratory:  Negative for cough, hemoptysis and shortness of breath.   Cardiovascular:  Negative for chest pain, leg swelling and palpitations.  Gastrointestinal:  Negative for abdominal pain, blood in stool,  constipation, diarrhea, nausea and vomiting.  Genitourinary:  Negative for hematuria.   Skin: Negative.   Neurological:  Positive for numbness. Negative for dizziness, headaches and light-headedness.  Hematological:  Does not bruise/bleed easily.     PAST MEDICAL/SURGICAL HISTORY:  Past Medical History:  Diagnosis Date   CAD (coronary artery disease) 10/02/2010   nuclear study show normal perfusion on medical therapy without scar or ischemia. EF 64%   GERD (gastroesophageal reflux disease)    Heart murmur    "mild one" (12/19/2017)   Hematochezia 11/19/2009   History of blood transfusion    "low HgB" (12/19/2017)   Hyperlipidemia    Hypertension    Iron deficiency anemia 11/19/2009   Osteoarthritis    "was in my knees" (12/19/2017)   PAD (peripheral artery disease) ([place])    Recurrent pancreatitis 11/19/2009   Sleep apnea    "trial mask in the 1990s; haven't used one since" (12/19/2017)   Type II diabetes mellitus ([place])    Past Surgical History:  Procedure Laterality Date   ABDOMINAL AORTOGRAM W/LOWER EXTREMITY N/A 12/19/2017   Procedure: ABDOMINAL AORTOGRAM W/LOWER EXTREMITY;  Surgeon: Lorretta Harp, MD;  Location: [facility];  Service: Cardiovascular;  Laterality: N/A;   CARDIAC CATHETERIZATION  08/1999   which revealed a mild coronary artery disease with 60 and 70 and 80% stenosis in a small first diagonal vessel at the LAD, 50% mid LAD stenosis, 50% ostial left circumflrx stenosis, and 40% ostial and proximal intermediate stenosis. he had 10 to 20% irregularities of his mid right coronary artery.  CHOLECYSTECTOMY N/A 07/08/2017   Procedure: LAPAROSCOPIC CHOLECYSTECTOMY;  Surgeon: Aviva Signs, MD;  Location: [facility];  Service: General;  Laterality: N/A;   CIRCUMCISION  07/17/2012   Procedure: CIRCUMCISION ADULT;  Surgeon: Dutch Gray, MD;  Location: [facility];  Service: Urology;  Laterality: N/A;   COLONOSCOPY  02/14/06   M JENKINS   COLONOSCOPY N/A 10/28/2014   Procedure:  COLONOSCOPY;  Surgeon: Rogene Houston, MD;  Location: [facility];  Service: Endoscopy;  Laterality: N/A;  730   ENTEROSCOPY N/A 10/04/2014   Procedure: ENTEROSCOPY;  Surgeon: Carol Ada, MD;  Location: [facility];  Service: Endoscopy;  Laterality: N/A;   ERCP  12/25/1996   ROURK   GIVENS CAPSULE STUDY N/A 10/04/2014   Procedure: GIVENS CAPSULE STUDY;  Surgeon: Carol Ada, MD;  Location: [facility];  Service: Endoscopy;  Laterality: N/A;   GIVENS CAPSULE STUDY N/A 08/04/2018   Procedure: GIVENS CAPSULE STUDY;  Surgeon: Rogene Houston, MD;  [facility];  Service: Endoscopy;  Laterality: N/A;  7:00am   JOINT REPLACEMENT     PERIPHERAL VASCULAR ATHERECTOMY Left 12/19/2017   Procedure: PERIPHERAL VASCULAR ATHERECTOMY;  Surgeon: Lorretta Harp, MD;  Location: [facility];  Service: Cardiovascular;  Laterality: Left;   PERIPHERAL VASCULAR INTERVENTION Left 12/19/2017   Procedure: PERIPHERAL VASCULAR INTERVENTION;  Surgeon: Lorretta Harp, MD;  Location: [facility];  Service: Cardiovascular;  Laterality: Left;  stent   SMALL BOWEL GIVENS  11/25/2008   TOTAL KNEE ARTHROPLASTY  2007-2008   UPPER GASTROINTESTINAL ENDOSCOPY  11/08/2008   EGD TCS     SOCIAL HISTORY:  Social History   Socioeconomic History   Marital status: Married    Spouse name: Not on file   Number of children: Not on file   Years of education: Not on file   Highest education level: Not on file  Occupational History   Not on file  Tobacco Use   Smoking status: Former    Packs/day: 2.00    Years: 50.00    Pack years: 100.00    Types: Cigarettes    Quit date: 09/28/2014    Years since quitting: 7.1   Smokeless tobacco: Never  Vaping Use   Vaping Use: Never used  Substance and Sexual Activity   Alcohol use: Never    Alcohol/week: 0.0 standard drinks   Drug use: Never   Sexual activity: Not Currently  Other Topics Concern   Not on file  Social History Narrative   Not on file    Social Determinants of Health   Financial Resource Strain: Not on file  Food Insecurity: Not on file  Transportation Needs: Not on file  Physical Activity: Not on file  Stress: Not on file  Social Connections: Not on file  Intimate Partner Violence: Not on file    FAMILY HISTORY:  Family History  Problem Relation Age of Onset   Healthy Sister    Healthy Brother    Healthy Sister    Healthy Brother    Healthy Brother    Healthy Daughter    Healthy Daughter     CURRENT MEDICATIONS:  Outpatient Encounter Medications as of 11/18/2021  Medication Sig   amLODIPine (KATERZIA) 1 mg/mL SUSP oral suspension Amlodipine   amLODipine (NORVASC) 10 MG tablet TAKE 1 TABLET EVERY DAY   Aspirin 81 MG CAPS Aspirin 81 mg   aspirin 81 MG tablet Take 1 tablet (81 mg total) by mouth daily. HOLD FOR 1 WEEK, THEN RESUME  IF NO BLEEDING PROBLEMS   atorvastatin (LIPITOR) 40 MG tablet SMARTSIG:1 By Mouth   atorvastatin (LIPITOR) 80 MG tablet TAKE 1 TABLET EVERY DAY   bisoprolol-hydrochlorothiazide (ZIAC) 2.5-6.25 MG tablet SMARTSIG:1 By Mouth   clobetasol (TEMOVATE) 0.05 % external solution Apply 1 application. topically at bedtime.   Cyanocobalamin (B-12) 1000 MCG TABS    empagliflozin (JARDIANCE) 25 MG TABS tablet Take 25 mg by mouth daily.    etodolac (LODINE XL) 400 MG 24 hr tablet    ferrous sulfate 220 (44 Fe) MG/5ML solution Iron supplement   Ferrous Sulfate Dried (HIGH POTENCY IRON) 65 MG TABS    finasteride (PROPECIA) 1 MG tablet Finasteride   finasteride (PROSCAR) 5 MG tablet Take 5 mg by mouth daily.   Insulin Degludec-Liraglutide (XULTOPHY) 100-3.6 UNIT-MG/ML SOPN Inject 24 Units into the skin daily.    Insulin Degludec-Liraglutide (XULTOPHY) 100-3.6 UNIT-MG/ML SOPN Xultophy   INVOKANA 300 MG TABS tablet SMARTSIG:1 By Mouth   levocetirizine (XYZAL) 2.5 MG/5ML solution Levocetirizine   levocetirizine (XYZAL) 5 MG tablet levocetirizine 5 mg tablet   Loratadine 10 MG CAPS Claritin    metFORMIN (GLUCOPHAGE-XR) 500 MG 24 hr tablet    metoprolol succinate (TOPROL-XL) 25 MG 24 hr tablet Take 25 mg by mouth daily.   Multiple Vitamins-Minerals (CENTRUM SILVER ULTRA MENS PO) Centrum Silver Ultra Men's   ONGLYZA 5 MG TABS tablet SMARTSIG:1 By Mouth   pantoprazole (PROTONIX) 40 MG tablet TAKE 1 TABLET EVERY DAY   pantoprazole sodium (PROTONIX) 40 mg/20 mL SUSP pantoprazole   ramipril (ALTACE) 1.25 MG capsule Ramipril   ramipril (ALTACE) 10 MG capsule TAKE 1 CAPSULE EVERY DAY   rOPINIRole (REQUIP) 0.25 MG tablet ropinirole 0.25 mg tablet   tamsulosin (FLOMAX) 0.4 MG CAPS capsule Take 0.4 mg by mouth.   triamcinolone cream (KENALOG) 0.1 % Apply 1 application. topically 2 (two) times daily.   vitamin B-12 (CYANOCOBALAMIN) 100 MCG tablet Vitamin B-12   No facility-administered encounter medications on file as of 11/18/2021.    ALLERGIES:  Allergies  Allergen Reactions   Morphine And Related Itching     PHYSICAL EXAM:  ECOG PERFORMANCE STATUS: 0 - Asymptomatic  There were no vitals filed for this visit. There were no vitals filed for this visit. Physical Exam Vitals reviewed.  Constitutional:      Appearance: Normal appearance.  Cardiovascular:     Rate and Rhythm: Normal rate and regular rhythm.     Pulses: Normal pulses.     Heart sounds: Normal heart sounds.  Pulmonary:     Effort: Pulmonary effort is normal.     Breath sounds: Normal breath sounds.  Abdominal:     Palpations: Abdomen is soft. There is no hepatomegaly, splenomegaly or mass.     Tenderness: There is no abdominal tenderness.  Musculoskeletal:     Right lower leg: No edema.     Left lower leg: No edema.  Lymphadenopathy:     Cervical: No cervical adenopathy.     Right cervical: No superficial, deep or posterior cervical adenopathy.    Left cervical: No superficial, deep or posterior cervical adenopathy.     Upper Body:     Right upper body: No supraclavicular or axillary adenopathy.     Left  upper body: No supraclavicular or axillary adenopathy.  Neurological:     General: No focal deficit present.     Mental Status: He is alert and oriented to person, place, and time.  Psychiatric:        Mood  and Affect: Mood normal.        Behavior: Behavior normal.     LABORATORY DATA:  I have reviewed the labs as listed.  CBC    Component Value Date/Time   WBC 7.5 11/11/2021 1114   RBC 5.36 11/11/2021 1114   HGB 12.0 (L) 11/11/2021 1114   HGB 11.0 (L) 12/14/2017 1119   HCT 42.8 11/11/2021 1114   HCT 36.2 (L) 12/14/2017 1119   PLT 309 11/11/2021 1114   PLT 414 12/14/2017 1119   MCV 79.9 (L) 11/11/2021 1114   MCV 72 (L) 12/14/2017 1119   MCH 22.4 (L) 11/11/2021 1114   MCHC 28.0 (L) 11/11/2021 1114   RDW Not Measured 11/11/2021 1114   RDW 18.3 (H) 12/14/2017 1119   LYMPHSABS 1.1 11/11/2021 1114   MONOABS 0.5 11/11/2021 1114   EOSABS 0.1 11/11/2021 1114   BASOSABS 0.1 11/11/2021 1114      Latest Ref Rng & Units 12/20/2017    2:23 AM 12/14/2017   11:19 AM 07/07/2017   10:32 PM  CMP  Glucose 70 - 99 mg/dL 113   175   265    BUN 8 - 23 mg/dL '8   10   12    '$ Creatinine 0.61 - 1.24 mg/dL 0.82   0.80   0.89    Sodium 135 - 145 mmol/L 139   141   135    Potassium 3.5 - 5.1 mmol/L 3.6   4.7   3.5    Chloride 98 - 111 mmol/L 102   100   98    CO2 22 - 32 mmol/L '29   25   24    '$ Calcium 8.9 - 10.3 mg/dL 8.3   10.1   9.2    Total Protein 6.5 - 8.1 g/dL   6.8    Total Bilirubin 0.3 - 1.2 mg/dL   0.4    Alkaline Phos 38 - 126 U/L   69    AST 15 - 41 U/L   20    ALT 17 - 63 U/L   17      DIAGNOSTIC IMAGING:  I have independently reviewed the relevant imaging and discussed with the patient.  ASSESSMENT & PLAN: 1.  Severe microcytic anemia / iron deficiency anemia - Patient seen at the request of Ulyses Southward, NP for severe microcytic anemia. - Labs on 09/30/2021: Hemoglobin 7.3, MCV 65, creatinine 0.73.  Percent saturation was 8.  Ferritin 4. - Additional labs (10/05/2021): Normal  copper, B12, methylmalonic acid, folate.  Normal SPEP and LDH.  Low reticulocytes for degree of anemia that was present. 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  Also required blood transfusion 7 years ago. - Last Venofer 1000 mg in divided doses from 10/08/2021 through 10/22/2021. - Symptoms improved after IV iron, with resolution of his fatigue, pica, and restless legs - He reports having black stools.  Denies any bleeding per rectum. - Most recent labs (11/11/2021): Hgb improved at 12.0/MCV 79.9, ferritin 37, iron saturation 13% - Most likely iron deficiency anemia from chronic blood loss. - Follows with Dr. Benson Norway of [facility].  Reportedly had EGD/colonoscopy on 10/14/2021, which were normal per patient report. - PLAN: Hemoglobin and iron are improved, but not yet back to target range.  Recommend additional IV iron with Venofer 300 mg x  3. - Repeat labs and RTC in 3 months   2.  Smoking history: - Patient has significant smoking history. - LDCT chest (11/13/2021): Lung RADS category 1S, negative for any signs of lung cancer, both signs of aortic atherosclerosis and left main and three-vessel CAD and findings suggestive of underlying COPD - PLAN: Continue annual LDCT chest as part of LCS program  3.  Social/family history: - Lives at home with his wife.  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  Smoked 2 packs/day for 50 years, quit in approximately 09/13/2018. - Brother died of lung cancer.  Patient grew up in foster home and does not know much of family history.   PLAN SUMMARY & DISPOSITION: Venofer 300 mg x 3 Labs and RTC in 3 months  All questions were answered. The patient knows to call the clinic with any problems, questions or concerns.  Medical decision making:  Moderate  Time spent on visit: I spent 20 minutes counseling the patient face to face. The total time spent in the appointment was 30 minutes and more than 50% was on counseling.   Harriett Rush, PA-C  11/18/21 1:34 PM

## 2021-11-18 ENCOUNTER — Inpatient Hospital Stay (HOSPITAL_BASED_OUTPATIENT_CLINIC_OR_DEPARTMENT_OTHER): Payer: Medicare HMO | Admitting: Physician Assistant

## 2021-11-18 VITALS — BP 150/74 | HR 91 | Temp 96.9°F | Resp 18 | Ht 66.14 in | Wt 169.3 lb

## 2021-11-18 DIAGNOSIS — D509 Iron deficiency anemia, unspecified: Secondary | ICD-10-CM | POA: Diagnosis not present

## 2021-11-18 DIAGNOSIS — Z79899 Other long term (current) drug therapy: Secondary | ICD-10-CM | POA: Diagnosis not present

## 2021-11-18 NOTE — Patient Instructions (Signed)
[place] at [facility] Discharge Instructions  You were seen today by Tarri Abernethy PA-C for your anemia.  Your blood and iron levels look much better, but are not back to their target range.  We will give you additional IV iron x 3 doses and see you for follow-up in 3 months.    Continue to follow with Dr. Benson Norway for possible GI bleeding.   Seek immediate medical attention if you have signs of active bleeding such as bright red blood in the toilet or black tarry bowel movements, especially if this is associated with fatigue, chest pain, or difficulty breathing.    Thank you for choosing [place] at [facility] to provide your oncology and hematology care.  To afford each patient quality time with our provider, please arrive at least 15 minutes before your scheduled appointment time.   If you have a lab appointment with the [facility] please come in thru the Main Entrance and check in at the main information desk.  You need to re-schedule your appointment should you arrive 10 or more minutes late.  We strive to give you quality time with our providers, and arriving late affects you and other patients whose appointments are after yours.  Also, if you no show three or more times for appointments you may be dismissed from the clinic at the providers discretion.     Again, thank you for choosing [facility].  Our hope is that these requests will decrease the amount of time that you wait before being seen by our physicians.       _____________________________________________________________  Should you have questions after your visit to [facility], please contact our office at [phone] and follow the prompts.  Our office hours are 8:00 a.m. and 4:30 p.m. Monday - Friday.  Please note that voicemails left after 4:00 p.m. may not be returned until the following business day.  We are closed weekends and major  holidays.  You do have access to a nurse 24-7, just call the main number to the clinic [phone] and do not press any options, hold on the line and a nurse will answer the phone.    For prescription refill requests, have your pharmacy contact our office and allow 72 hours.    Due to Covid, you will need to wear a mask upon entering the hospital. If you do not have a mask, a mask will be given to you at the Main Entrance upon arrival. For doctor visits, patients may have 1 support person age 46 or older with them. For treatment visits, patients can not have anyone with them due to social distancing guidelines and our immunocompromised population.

## 2021-11-23 ENCOUNTER — Ambulatory Visit (INDEPENDENT_AMBULATORY_CARE_PROVIDER_SITE_OTHER): Payer: Medicare HMO | Admitting: Gastroenterology

## 2021-11-23 ENCOUNTER — Other Ambulatory Visit: Payer: Self-pay | Admitting: Cardiovascular Disease

## 2021-11-25 ENCOUNTER — Ambulatory Visit (INDEPENDENT_AMBULATORY_CARE_PROVIDER_SITE_OTHER): Payer: Medicare HMO | Admitting: Cardiovascular Disease

## 2021-11-25 ENCOUNTER — Encounter: Payer: Self-pay | Admitting: Cardiovascular Disease

## 2021-11-25 VITALS — BP 138/79 | HR 76 | Ht 66.0 in | Wt 168.6 lb

## 2021-11-25 DIAGNOSIS — E782 Mixed hyperlipidemia: Secondary | ICD-10-CM | POA: Diagnosis not present

## 2021-11-25 DIAGNOSIS — I2583 Coronary atherosclerosis due to lipid rich plaque: Secondary | ICD-10-CM

## 2021-11-25 DIAGNOSIS — Z95828 Presence of other vascular implants and grafts: Secondary | ICD-10-CM | POA: Diagnosis not present

## 2021-11-25 DIAGNOSIS — I739 Peripheral vascular disease, unspecified: Secondary | ICD-10-CM | POA: Diagnosis not present

## 2021-11-25 DIAGNOSIS — I10 Essential (primary) hypertension: Secondary | ICD-10-CM | POA: Diagnosis not present

## 2021-11-25 DIAGNOSIS — I2510 Atherosclerotic heart disease of native coronary artery without angina pectoris: Secondary | ICD-10-CM | POA: Diagnosis not present

## 2021-11-25 NOTE — Assessment & Plan Note (Signed)
History of nonobstructive CAD by remote cardiac catheterization performed by Dr. Claiborne Billings.  He is completely asymptomatic.  Recent chest CT performed for cancer screening 11/13/2021 showed calcium in all 3 coronary arteries.  He is already on statin therapy with an excellent LDL.

## 2021-11-25 NOTE — Patient Instructions (Signed)
Medication Instructions:  Your physician recommends that you continue on your current medications as directed. Please refer to the Current Medication list given to you today.  *If you need a refill on your cardiac medications before your next appointment, please call your pharmacy*   Testing/Procedures: Dr. Gwenlyn Found has recommended that you have an Ultrasound of your AORTA/IVC/ILIACS.   To prepare for this test:  No food after 11PM the night before. Water is OK. (Don't drink liquids if you have been instructed not to for ANOTHER test).  Avoid foods that produce bowel gas, for 24 hours prior to exam (see below). No breakfast, no chewing gum, no smoking or carbonated beverages. Patient may take morning medications with water. Come in for test at least 15 minutes early to register.  Your physician has requested that you have an ankle brachial index (ABI). During this test an ultrasound and blood pressure cuff are used to evaluate the arteries that supply the arms and legs with blood. Allow thirty minutes for this exam. There are no restrictions or special instructions. To be done in January 2024. These procedures will be done at [facility]   Follow-Up: At [facility], you and your health needs are our priority.  As part of our continuing mission to provide you with exceptional heart care, we have created designated Provider Care Teams.  These Care Teams include your primary Cardiologist (physician) and Advanced Practice Providers (APPs -  Physician Assistants and Nurse Practitioners) who all work together to provide you with the care you need, when you need it.  We recommend signing up for the patient portal called "MyChart".  Sign up information is provided on this After Visit Summary.  MyChart is used to connect with patients for Virtual Visits (Telemedicine).  Patients are able to view lab/test results, encounter notes, upcoming appointments, etc.  Non-urgent messages can be  sent to your provider as well.   To learn more about what you can do with MyChart, go to [email or web address].    Your next appointment:   12 month(s)  The format for your next appointment:   In Person  Provider:   Quay Burow, MD

## 2021-11-25 NOTE — Assessment & Plan Note (Signed)
History of essential hypertension blood pressure measured today at 138/79.  He is on amlodipine, ramipril and metoprolol.

## 2021-11-25 NOTE — Assessment & Plan Note (Signed)
History of peripheral arterial disease with left lower extremity discomfort thought initially to be related to arthritis.  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  I performed orbital atherectomy, and VBX covered stenting with excellent result.  He did have a 50% right common iliac artery stenosis with a 50 mm gradient but he denies symptoms on that side.  His Dopplers normalized and his claudication resolved.  Recent lower extremity arterial Doppler studies performed 07/22/2021 revealed a right ABI of 0.94, left of 0.74 with moderate disease on the right and a widely patent left iliac stent.

## 2021-11-25 NOTE — Assessment & Plan Note (Signed)
History of hyperlipidemia on statin therapy with lipid profile performed 09/09/2021 revealing total cholesterol 115, LDL of 63 and HDL 38.

## 2021-11-25 NOTE — Progress Notes (Signed)
11/25/2021 Luke [name]   1951-06-03  [id number]  Primary Physician Celene Squibb, MD Primary Cardiologist: Lorretta Harp MD FACP, [place], [place], [place]  HPI:  Luke [name] is a 71 y.o.   thin appearing married Caucasian male father of 2, grandfather of 4 grandchildren who I last saw in the office 06/30/2021.  He is accompanied by his wife Luke [name] today.Marland Kitchen Apparently I took care of her father 15 years ago, Luke [name].  He was referred by Beckie Busing, NP for peripheral vascular evaluation because of lifestyle limiting claudication.  I last saw him in the office 01/29/2020.  His risk factors include greater than 100 pack years of tobacco abuse having quit for year ago, treated hypertension, diabetes and hyperlipidemia.  He is never had a heart attack or stroke.  He denies chest pain or shortness of breath.  He does have documented coronary disease by Dr. Claiborne Billings years ago but was never intervened on.  He is complained of lower extremity claudication for the last 4 years which has gone undiagnosed and treated as a "orthopedic issue".  Recent Dopplers performed in our office 12/12/2017 revealed a right ABI of 0.90 and left 0.51 with high-frequency signals in both iliac arteries.  I suspect this left common iliac is either occluded or or subtotally occluded.  He wishes to proceed with angiography and intervention.   I performed peripheral angiography on him 12/19/2017 revealing a 91% calcified ostial left common iliac artery stenosis and a 50% right.  I performed Dynabac orbital rotational atherectomy, PTA and covered stenting on him with an excellent angiographic and clinical result.  Follow-up Dopplers performed subsequent to that revealed increase in his left ABI from 0.51 up to 0.85.  His claudication has completely resolved.   Since I saw him in the office 6 months ago he continues to do well.  He denies chest pain or shortness of breath.  Recent Doppler studies performed 07/22/2021 confirm widely patent left  iliac stent.  He did have a chest CT performed 11/13/2021 for cancer screening that did show coronary calcification in all 3 coronary arteries.  His LDL is already well below 70 for secondary prevention.   Current Meds  Medication Sig   amLODipine (NORVASC) 10 MG tablet TAKE 1 TABLET EVERY DAY   aspirin 81 MG tablet Take 1 tablet (81 mg total) by mouth daily. HOLD FOR 1 WEEK, THEN RESUME IF NO BLEEDING PROBLEMS   atorvastatin (LIPITOR) 40 MG tablet SMARTSIG:1 By Mouth   clobetasol (TEMOVATE) 0.05 % external solution Apply 1 application. topically at bedtime.   empagliflozin (JARDIANCE) 25 MG TABS tablet Take 25 mg by mouth daily.    Ferrous Sulfate Dried (HIGH POTENCY IRON) 65 MG TABS    finasteride (PROSCAR) 5 MG tablet Take 5 mg by mouth daily.   Insulin Degludec-Liraglutide (XULTOPHY) 100-3.6 UNIT-MG/ML SOPN Inject 24 Units into the skin daily.    levocetirizine (XYZAL) 5 MG tablet levocetirizine 5 mg tablet   Loratadine 10 MG CAPS Claritin   metFORMIN (GLUCOPHAGE) 500 MG tablet Take 1,000 mg by mouth 2 (two) times daily.   Multiple Vitamins-Minerals (CENTRUM SILVER ULTRA MENS PO) Centrum Silver Ultra Men's   pantoprazole (PROTONIX) 40 MG tablet TAKE 1 TABLET EVERY DAY   ramipril (ALTACE) 10 MG capsule TAKE 1 CAPSULE EVERY DAY   tamsulosin (FLOMAX) 0.4 MG CAPS capsule Take 0.4 mg by mouth.   triamcinolone cream (KENALOG) 0.1 % Apply 1 application. topically 2 (two) times daily.   vitamin  B-12 (CYANOCOBALAMIN) 100 MCG tablet Vitamin B-12     Allergies  Allergen Reactions   Morphine And Related Itching    Social History   Socioeconomic History   Marital status: Married    Spouse name: Not on file   Number of children: Not on file   Years of education: Not on file   Highest education level: Not on file  Occupational History   Not on file  Tobacco Use   Smoking status: Former    Packs/day: 2.00    Years: 50.00    Pack years: 100.00    Types: Cigarettes    Quit date:  09/28/2014    Years since quitting: 7.1   Smokeless tobacco: Never  Vaping Use   Vaping Use: Never used  Substance and Sexual Activity   Alcohol use: Never    Alcohol/week: 0.0 standard drinks   Drug use: Never   Sexual activity: Not Currently  Other Topics Concern   Not on file  Social History Narrative   Not on file   Social Determinants of Health   Financial Resource Strain: Not on file  Food Insecurity: Not on file  Transportation Needs: Not on file  Physical Activity: Not on file  Stress: Not on file  Social Connections: Not on file  Intimate Partner Violence: Not on file     Review of Systems: General: negative for chills, fever, night sweats or weight changes.  Cardiovascular: negative for chest pain, dyspnea on exertion, edema, orthopnea, palpitations, paroxysmal nocturnal dyspnea or shortness of breath Dermatological: negative for rash Respiratory: negative for cough or wheezing Urologic: negative for hematuria Abdominal: negative for nausea, vomiting, diarrhea, bright red blood per rectum, melena, or hematemesis Neurologic: negative for visual changes, syncope, or dizziness All other systems reviewed and are otherwise negative except as noted above.    Blood pressure 138/79, pulse 76, height '5\' 6"'$  (1.676 m), weight 168 lb 9.6 oz (76.5 kg), SpO2 97 %.  General appearance: alert and no distress Neck: no adenopathy, no carotid bruit, no JVD, supple, symmetrical, trachea midline, and thyroid not enlarged, symmetric, no tenderness/mass/nodules Lungs: clear to auscultation bilaterally Heart: regular rate and rhythm, S1, S2 normal, no murmur, click, rub or gallop Extremities: extremities normal, atraumatic, no cyanosis or edema Pulses: 2+ and symmetric Skin: Skin color, texture, turgor normal. No rashes or lesions Neurologic: Grossly normal  EKG sinus rhythm at 76 without ST or T wave changes.  I personally reviewed this EKG.  ASSESSMENT AND PLAN:   HTN  (hypertension) History of essential hypertension blood pressure measured today at 138/79.  He is on amlodipine, ramipril and metoprolol.  Hyperlipemia History of hyperlipidemia on statin therapy with lipid profile performed 09/09/2021 revealing total cholesterol 115, LDL of 63 and HDL 38.  CAD (coronary artery disease) History of nonobstructive CAD by remote cardiac catheterization performed by Dr. Claiborne Billings.  He is completely asymptomatic.  Recent chest CT performed for cancer screening 11/13/2021 showed calcium in all 3 coronary arteries.  He is already on statin therapy with an excellent LDL.  Peripheral arterial disease (HCC) History of peripheral arterial disease with left lower extremity discomfort thought initially to be related to arthritis.  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  I performed orbital atherectomy, and VBX covered stenting with excellent result.  He did have a 50% right common iliac artery stenosis with a 50 mm gradient but he denies symptoms on that side.  His Dopplers  normalized and his claudication resolved.  Recent lower extremity arterial Doppler studies performed 07/22/2021 revealed a right ABI of 0.94, left of 0.74 with moderate disease on the right and a widely patent left iliac stent.     Lorretta Harp MD FACP,FACC,FAHA, [facility] 11/25/2021 11:00 AM

## 2021-11-26 ENCOUNTER — Inpatient Hospital Stay (HOSPITAL_COMMUNITY): Payer: Medicare HMO | Attending: Hematology

## 2021-11-26 VITALS — BP 154/70 | HR 74 | Temp 97.0°F | Resp 18

## 2021-11-26 DIAGNOSIS — Z79899 Other long term (current) drug therapy: Secondary | ICD-10-CM | POA: Insufficient documentation

## 2021-11-26 DIAGNOSIS — D509 Iron deficiency anemia, unspecified: Secondary | ICD-10-CM | POA: Diagnosis not present

## 2021-11-26 MED ORDER — SODIUM CHLORIDE 0.9 % IV SOLN: Freq: Once | INTRAVENOUS | Status: AC

## 2021-11-26 MED ORDER — SODIUM CHLORIDE 0.9 % IV SOLN
300.0000 mg | Freq: Once | INTRAVENOUS | Status: AC
  Administered 2021-11-26: 300 mg via INTRAVENOUS
  Filled 2021-11-26: qty 300

## 2021-11-26 MED ORDER — ACETAMINOPHEN 325 MG PO TABS
650.0000 mg | ORAL_TABLET | Freq: Once | ORAL | Status: AC
  Administered 2021-11-26: 650 mg via ORAL
  Filled 2021-11-26: qty 2

## 2021-11-26 MED ORDER — LORATADINE 10 MG PO TABS
10.0000 mg | ORAL_TABLET | Freq: Once | ORAL | Status: AC
  Administered 2021-11-26: 10 mg via ORAL
  Filled 2021-11-26: qty 1

## 2021-11-26 NOTE — Patient Instructions (Signed)
[facility]  Discharge Instructions: Thank you for choosing [facility] to provide your oncology and hematology care.  If you have a lab appointment with the [facility], please come in thru the Main Entrance and check in at the main information desk.  Wear comfortable clothing and clothing appropriate for easy access to any Portacath or PICC line.   We strive to give you quality time with your provider. You may need to reschedule your appointment if you arrive late (15 or more minutes).  Arriving late affects you and other patients whose appointments are after yours.  Also, if you miss three or more appointments without notifying the office, you may be dismissed from the clinic at the provider's discretion.      For prescription refill requests, have your pharmacy contact our office and allow 72 hours for refills to be completed.    Today you received the following chemotherapy and/or immunotherapy agents venofer.  Iron Sucrose Injection What is this medication? IRON SUCROSE (EYE ern SOO krose) treats low levels of iron (iron deficiency anemia) in people with kidney disease. Iron is a mineral that plays an important role in making red blood cells, which carry oxygen from your lungs to the rest of your body. This medicine may be used for other purposes; ask your health care provider or pharmacist if you have questions. COMMON BRAND NAME(S): Venofer What should I tell my care team before I take this medication? They need to know if you have any of these conditions: Anemia not caused by low iron levels Heart disease High levels of iron in the blood Kidney disease Liver disease An unusual or allergic reaction to iron, other medications, foods, dyes, or preservatives Pregnant or trying to get pregnant Breast-feeding How should I use this medication? This medication is for infusion into a vein. It is given in a hospital or clinic setting. Talk to your care team  about the use of this medication in children. While this medication may be prescribed for children as young as 2 years for selected conditions, precautions do apply. Overdosage: If you think you have taken too much of this medicine contact a poison control center or emergency room at once. NOTE: This medicine is only for you. Do not share this medicine with others. What if I miss a dose? It is important not to miss your dose. Call your care team if you are unable to keep an appointment. What may interact with this medication? Do not take this medication with any of the following: Deferoxamine Dimercaprol Other iron products This medication may also interact with the following: Chloramphenicol Deferasirox This list may not describe all possible interactions. Give your health care provider a list of all the medicines, herbs, non-prescription drugs, or dietary supplements you use. Also tell them if you smoke, drink alcohol, or use illegal drugs. Some items may interact with your medicine. What should I watch for while using this medication? Visit your care team regularly. Tell your care team if your symptoms do not start to get better or if they get worse. You may need blood work done while you are taking this medication. You may need to follow a special diet. Talk to your care team. Foods that contain iron include: whole grains/cereals, dried fruits, beans, or peas, leafy green vegetables, and organ meats (liver, kidney). What side effects may I notice from receiving this medication? Side effects that you should report to your care team as soon as possible:  Allergic reactions--skin rash, itching, hives, swelling of the face, lips, tongue, or throat Low blood pressure--dizziness, feeling faint or lightheaded, blurry vision Shortness of breath Side effects that usually do not require medical attention (report to your care team if they continue or are bothersome): Flushing Headache Joint  pain Muscle pain Nausea Pain, redness, or irritation at injection site This list may not describe all possible side effects. Call your doctor for medical advice about side effects. You may report side effects to FDA at 1-800-FDA-1088. Where should I keep my medication? This medication is given in a hospital or clinic and will not be stored at home. NOTE: This sheet is a summary. It may not cover all possible information. If you have questions about this medicine, talk to your doctor, pharmacist, or health care provider.  2023 Elsevier/Gold Standard (2020-10-31 00:00:00)       To help prevent nausea and vomiting after your treatment, we encourage you to take your nausea medication as directed.  BELOW ARE SYMPTOMS THAT SHOULD BE REPORTED IMMEDIATELY: *FEVER GREATER THAN 100.4 F (38 C) OR HIGHER *CHILLS OR SWEATING *NAUSEA AND VOMITING THAT IS NOT CONTROLLED WITH YOUR NAUSEA MEDICATION *UNUSUAL SHORTNESS OF BREATH *UNUSUAL BRUISING OR BLEEDING *URINARY PROBLEMS (pain or burning when urinating, or frequent urination) *BOWEL PROBLEMS (unusual diarrhea, constipation, pain near the anus) TENDERNESS IN MOUTH AND THROAT WITH OR WITHOUT PRESENCE OF ULCERS (sore throat, sores in mouth, or a toothache) UNUSUAL RASH, SWELLING OR PAIN  UNUSUAL VAGINAL DISCHARGE OR ITCHING   Items with * indicate a potential emergency and should be followed up as soon as possible or go to the Emergency Department if any problems should occur.  Please show the CHEMOTHERAPY ALERT CARD or IMMUNOTHERAPY ALERT CARD at check-in to the Emergency Department and triage nurse.  Should you have questions after your visit or need to cancel or reschedule your appointment, please contact [facility] [phone]  and follow the prompts.  Office hours are 8:00 a.m. to 4:30 p.m. Monday - Friday. Please note that voicemails left after 4:00 p.m. may not be returned until the following business day.  We are closed weekends  and major holidays. You have access to a nurse at all times for urgent questions. Please call the main number to the clinic [phone] and follow the prompts.  For any non-urgent questions, you may also contact your provider using MyChart. We now offer e-Visits for anyone 51 and older to request care online for non-urgent symptoms. For details visit [email or web address].   Also download the MyChart app! Go to the app store, search "MyChart", open the app, select [facility], and log in with your MyChart username and password.  Due to Covid, a mask is required upon entering the hospital/clinic. If you do not have a mask, one will be given to you upon arrival. For doctor visits, patients may have 1 support person aged 34 or older with them. For treatment visits, patients cannot have anyone with them due to current Covid guidelines and our immunocompromised population.

## 2021-11-26 NOTE — Progress Notes (Signed)
Patient did not want to stay the 30 minutes post iron infusion.  Reviewed side effects and the reason for the wait time.  Patient stated he wanted to leave and understood the risks.    Patient tolerated iron infusion with no complaints voiced.  Peripheral IV site clean and dry with good blood return noted before and after infusion.  Band aid applied.  VSS with discharge and left in satisfactory condition with no s/s of distress noted.

## 2021-12-03 ENCOUNTER — Inpatient Hospital Stay (HOSPITAL_COMMUNITY): Payer: Medicare HMO

## 2021-12-03 VITALS — BP 170/71 | HR 69 | Temp 97.9°F | Resp 18

## 2021-12-03 DIAGNOSIS — D509 Iron deficiency anemia, unspecified: Secondary | ICD-10-CM | POA: Diagnosis not present

## 2021-12-03 DIAGNOSIS — Z79899 Other long term (current) drug therapy: Secondary | ICD-10-CM | POA: Diagnosis not present

## 2021-12-03 MED ORDER — SODIUM CHLORIDE 0.9 % IV SOLN: Freq: Once | INTRAVENOUS | Status: AC

## 2021-12-03 MED ORDER — SODIUM CHLORIDE 0.9 % IV SOLN
300.0000 mg | Freq: Once | INTRAVENOUS | Status: AC
  Administered 2021-12-03: 300 mg via INTRAVENOUS
  Filled 2021-12-03: qty 10

## 2021-12-03 NOTE — Patient Instructions (Signed)
[facility]  Discharge Instructions: Thank you for choosing [facility] to provide your oncology and hematology care.  If you have a lab appointment with the Cancer Center, please come in thru the Main Entrance and check in at the main information desk.  Wear comfortable clothing and clothing appropriate for easy access to any Portacath or PICC line.   We strive to give you quality time with your provider. You may need to reschedule your appointment if you arrive late (15 or more minutes).  Arriving late affects you and other patients whose appointments are after yours.  Also, if you miss three or more appointments without notifying the office, you may be dismissed from the clinic at the provider's discretion.      For prescription refill requests, have your pharmacy contact our office and allow 72 hours for refills to be completed.         To help prevent nausea and vomiting after your treatment, we encourage you to take your nausea medication as directed.  BELOW ARE SYMPTOMS THAT SHOULD BE REPORTED IMMEDIATELY: *FEVER GREATER THAN 100.4 F (38 C) OR HIGHER *CHILLS OR SWEATING *NAUSEA AND VOMITING THAT IS NOT CONTROLLED WITH YOUR NAUSEA MEDICATION *UNUSUAL SHORTNESS OF BREATH *UNUSUAL BRUISING OR BLEEDING *URINARY PROBLEMS (pain or burning when urinating, or frequent urination) *BOWEL PROBLEMS (unusual diarrhea, constipation, pain near the anus) TENDERNESS IN MOUTH AND THROAT WITH OR WITHOUT PRESENCE OF ULCERS (sore throat, sores in mouth, or a toothache) UNUSUAL RASH, SWELLING OR PAIN  UNUSUAL VAGINAL DISCHARGE OR ITCHING   Items with * indicate a potential emergency and should be followed up as soon as possible or go to the Emergency Department if any problems should occur.  Please show the CHEMOTHERAPY ALERT CARD or IMMUNOTHERAPY ALERT CARD at check-in to the Emergency Department and triage nurse.  Should you have questions after your visit or need to  cancel or reschedule your appointment, please contact  CANCER CENTER [phone]  and follow the prompts.  Office hours are 8:00 a.m. to 4:30 p.m. Monday - Friday. Please note that voicemails left after 4:00 p.m. may not be returned until the following business day.  We are closed weekends and major holidays. You have access to a nurse at all times for urgent questions. Please call the main number to the clinic [phone] and follow the prompts.  For any non-urgent questions, you may also contact your provider using MyChart. We now offer e-Visits for anyone 18 and older to request care online for non-urgent symptoms. For details visit [email or web address].   Also download the MyChart app! Go to the app store, search "MyChart", open the app, select [facility], and log in with your MyChart username and password.  Masks are optional in the cancer centers. If you would like for your care team to wear a mask while they are taking care of you, please let them know. For doctor visits, patients may have with them one support person who is at least 72 years old. At this time, visitors are not allowed in the infusion area.  

## 2021-12-03 NOTE — Progress Notes (Signed)
Patient presents today for iron infusion.  Patient is in satisfactory condition with no complaints voiced.  Vital signs are stable.  Pre-medications were taken at home prior to [facility].  We will proceed with infusion per MD orders.   Patient tolerated treatment well with no complaints voiced.  Patient refused the recommended 30 minute post iron wait time.  Patient left ambulatory in stable condition.  Vital signs stable at discharge.  Follow up as scheduled.

## 2021-12-05 DIAGNOSIS — H53141 Visual discomfort, right eye: Secondary | ICD-10-CM | POA: Diagnosis not present

## 2021-12-05 DIAGNOSIS — H53142 Visual discomfort, left eye: Secondary | ICD-10-CM | POA: Diagnosis not present

## 2021-12-08 DIAGNOSIS — D509 Iron deficiency anemia, unspecified: Secondary | ICD-10-CM | POA: Diagnosis not present

## 2021-12-08 DIAGNOSIS — F630 Pathological gambling: Secondary | ICD-10-CM | POA: Diagnosis not present

## 2021-12-08 DIAGNOSIS — E1169 Type 2 diabetes mellitus with other specified complication: Secondary | ICD-10-CM | POA: Diagnosis not present

## 2021-12-09 DIAGNOSIS — E119 Type 2 diabetes mellitus without complications: Secondary | ICD-10-CM | POA: Diagnosis not present

## 2021-12-09 DIAGNOSIS — Z794 Long term (current) use of insulin: Secondary | ICD-10-CM | POA: Diagnosis not present

## 2021-12-09 DIAGNOSIS — H25813 Combined forms of age-related cataract, bilateral: Secondary | ICD-10-CM | POA: Diagnosis not present

## 2021-12-09 DIAGNOSIS — H1045 Other chronic allergic conjunctivitis: Secondary | ICD-10-CM | POA: Diagnosis not present

## 2021-12-09 DIAGNOSIS — H10503 Unspecified blepharoconjunctivitis, bilateral: Secondary | ICD-10-CM | POA: Diagnosis not present

## 2021-12-16 ENCOUNTER — Ambulatory Visit: Payer: Medicare HMO | Admitting: Cardiovascular Disease

## 2021-12-23 ENCOUNTER — Inpatient Hospital Stay (HOSPITAL_COMMUNITY): Payer: Medicare HMO | Attending: Hematology

## 2021-12-23 VITALS — BP 143/73 | HR 79 | Temp 97.0°F | Resp 18

## 2021-12-23 DIAGNOSIS — Z79899 Other long term (current) drug therapy: Secondary | ICD-10-CM | POA: Diagnosis not present

## 2021-12-23 DIAGNOSIS — D509 Iron deficiency anemia, unspecified: Secondary | ICD-10-CM | POA: Diagnosis not present

## 2021-12-23 MED ORDER — SODIUM CHLORIDE 0.9 % IV SOLN
300.0000 mg | Freq: Once | INTRAVENOUS | Status: AC
  Administered 2021-12-23: 300 mg via INTRAVENOUS
  Filled 2021-12-23: qty 5

## 2021-12-23 MED ORDER — LORATADINE 10 MG PO TABS: 10.0000 mg | ORAL_TABLET | Freq: Once | ORAL | Status: DC

## 2021-12-23 MED ORDER — ACETAMINOPHEN 325 MG PO TABS: 650.0000 mg | ORAL_TABLET | Freq: Once | ORAL | Status: DC

## 2021-12-23 MED ORDER — SODIUM CHLORIDE 0.9 % IV SOLN: Freq: Once | INTRAVENOUS | Status: AC

## 2021-12-23 NOTE — Progress Notes (Signed)
Patient presents today for Venofer per providers order.  Vital signs WNL.  Patient has no new complaints at this time.    Peripheral IV started and blood return noted pre and post infusion.  Patient declines to wait his thirty minute post infusion wait time.

## 2021-12-23 NOTE — Patient Instructions (Signed)
[facility]  Discharge Instructions: Thank you for choosing [facility] to provide your oncology and hematology care.  If you have a lab appointment with the Cancer Center, please come in thru the Main Entrance and check in at the main information desk.  Wear comfortable clothing and clothing appropriate for easy access to any Portacath or PICC line.   We strive to give you quality time with your provider. You may need to reschedule your appointment if you arrive late (15 or more minutes).  Arriving late affects you and other patients whose appointments are after yours.  Also, if you miss three or more appointments without notifying the office, you may be dismissed from the clinic at the provider's discretion.      For prescription refill requests, have your pharmacy contact our office and allow 72 hours for refills to be completed.    Today you received the following chemotherapy and/or immunotherapy agents Venofer      To help prevent nausea and vomiting after your treatment, we encourage you to take your nausea medication as directed.  BELOW ARE SYMPTOMS THAT SHOULD BE REPORTED IMMEDIATELY: *FEVER GREATER THAN 100.4 F (38 C) OR HIGHER *CHILLS OR SWEATING *NAUSEA AND VOMITING THAT IS NOT CONTROLLED WITH YOUR NAUSEA MEDICATION *UNUSUAL SHORTNESS OF BREATH *UNUSUAL BRUISING OR BLEEDING *URINARY PROBLEMS (pain or burning when urinating, or frequent urination) *BOWEL PROBLEMS (unusual diarrhea, constipation, pain near the anus) TENDERNESS IN MOUTH AND THROAT WITH OR WITHOUT PRESENCE OF ULCERS (sore throat, sores in mouth, or a toothache) UNUSUAL RASH, SWELLING OR PAIN  UNUSUAL VAGINAL DISCHARGE OR ITCHING   Items with * indicate a potential emergency and should be followed up as soon as possible or go to the Emergency Department if any problems should occur.  Please show the CHEMOTHERAPY ALERT CARD or IMMUNOTHERAPY ALERT CARD at check-in to the Emergency  Department and triage nurse.  Should you have questions after your visit or need to cancel or reschedule your appointment, please contact [facility] [phone]  and follow the prompts.  Office hours are 8:00 a.m. to 4:30 p.m. Monday - Friday. Please note that voicemails left after 4:00 p.m. may not be returned until the following business day.  We are closed weekends and major holidays. You have access to a nurse at all times for urgent questions. Please call the main number to the clinic [phone] and follow the prompts.  For any non-urgent questions, you may also contact your provider using MyChart. We now offer e-Visits for anyone 18 and older to request care online for non-urgent symptoms. For details visit [email or web address].   Also download the MyChart app! Go to the app store, search "MyChart", open the app, select [facility], and log in with your MyChart username and password.  Masks are optional in the cancer centers. If you would like for your care team to wear a mask while they are taking care of you, please let them know. For doctor visits, patients may have with them one support person who is at least 72 years old. At this time, visitors are not allowed in the infusion area.  

## 2021-12-23 NOTE — Progress Notes (Signed)
Infusion completed, patient did not want to wait his 30 minute post infusion wait time.  Patient reminded that should he have any s/s of reaction to report to the ER immediately.  He verbalizes understanding.  Discharged ambulatory and in stable condition from clinic.

## 2021-12-29 DIAGNOSIS — I10 Essential (primary) hypertension: Secondary | ICD-10-CM | POA: Diagnosis not present

## 2021-12-29 DIAGNOSIS — R5383 Other fatigue: Secondary | ICD-10-CM | POA: Diagnosis not present

## 2021-12-29 DIAGNOSIS — E782 Mixed hyperlipidemia: Secondary | ICD-10-CM | POA: Diagnosis not present

## 2021-12-29 DIAGNOSIS — D519 Vitamin B12 deficiency anemia, unspecified: Secondary | ICD-10-CM | POA: Diagnosis not present

## 2021-12-29 DIAGNOSIS — D509 Iron deficiency anemia, unspecified: Secondary | ICD-10-CM | POA: Diagnosis not present

## 2021-12-29 DIAGNOSIS — E1169 Type 2 diabetes mellitus with other specified complication: Secondary | ICD-10-CM | POA: Diagnosis not present

## 2022-01-06 ENCOUNTER — Encounter: Payer: Self-pay | Admitting: Podiatry

## 2022-01-06 ENCOUNTER — Ambulatory Visit: Payer: Medicare HMO | Admitting: Podiatry

## 2022-01-06 ENCOUNTER — Ambulatory Visit: Payer: Medicare HMO

## 2022-01-06 DIAGNOSIS — E119 Type 2 diabetes mellitus without complications: Secondary | ICD-10-CM

## 2022-01-06 DIAGNOSIS — M2041 Other hammer toe(s) (acquired), right foot: Secondary | ICD-10-CM | POA: Diagnosis not present

## 2022-01-06 DIAGNOSIS — L84 Corns and callosities: Secondary | ICD-10-CM | POA: Diagnosis not present

## 2022-01-06 DIAGNOSIS — L400 Psoriasis vulgaris: Secondary | ICD-10-CM | POA: Diagnosis not present

## 2022-01-06 DIAGNOSIS — D509 Iron deficiency anemia, unspecified: Secondary | ICD-10-CM | POA: Diagnosis not present

## 2022-01-06 DIAGNOSIS — M79674 Pain in right toe(s): Secondary | ICD-10-CM

## 2022-01-06 DIAGNOSIS — I739 Peripheral vascular disease, unspecified: Secondary | ICD-10-CM | POA: Diagnosis not present

## 2022-01-06 DIAGNOSIS — E1169 Type 2 diabetes mellitus with other specified complication: Secondary | ICD-10-CM | POA: Diagnosis not present

## 2022-01-06 DIAGNOSIS — N400 Enlarged prostate without lower urinary tract symptoms: Secondary | ICD-10-CM | POA: Diagnosis not present

## 2022-01-06 DIAGNOSIS — M79675 Pain in left toe(s): Secondary | ICD-10-CM | POA: Diagnosis not present

## 2022-01-06 DIAGNOSIS — M205X2 Other deformities of toe(s) (acquired), left foot: Secondary | ICD-10-CM

## 2022-01-06 DIAGNOSIS — G4710 Hypersomnia, unspecified: Secondary | ICD-10-CM | POA: Diagnosis not present

## 2022-01-06 DIAGNOSIS — M205X1 Other deformities of toe(s) (acquired), right foot: Secondary | ICD-10-CM | POA: Diagnosis not present

## 2022-01-06 DIAGNOSIS — M2042 Other hammer toe(s) (acquired), left foot: Secondary | ICD-10-CM | POA: Diagnosis not present

## 2022-01-06 DIAGNOSIS — E782 Mixed hyperlipidemia: Secondary | ICD-10-CM | POA: Diagnosis not present

## 2022-01-06 DIAGNOSIS — F630 Pathological gambling: Secondary | ICD-10-CM | POA: Diagnosis not present

## 2022-01-06 DIAGNOSIS — I10 Essential (primary) hypertension: Secondary | ICD-10-CM | POA: Diagnosis not present

## 2022-01-06 DIAGNOSIS — B351 Tinea unguium: Secondary | ICD-10-CM | POA: Diagnosis not present

## 2022-01-06 NOTE — Progress Notes (Signed)
Patient presents to the office today for diabetic shoe and insole measuring.  Patient was measured with brannock device to determine size and width for 1 pair of extra depth shoes and foam casted for 3 pair of insoles.   Documentation of medical necessity will be sent to patient's treating diabetic doctor to verify and sign.   Patient's diabetic provider: Celene Squibb, MD  Shoes and insoles will be ordered at that time and patient will be notified for an appointment for fitting when they arrive.   Shoe size (per patient): 8 men's   Brannock measurement: 8 men's x-wide  Patient shoe selection-   1st choice:[address] [employer] [place]   Shoe size ordered: 8 men's x-wide

## 2022-01-06 NOTE — Progress Notes (Signed)
This patient returns to my office for at risk foot care.  This patient requires this care by a professional since this patient will be at risk due to having diabetes and PAD.   This patient is unable to cut nails himself since the patient cannot reach his nails.These nails are painful walking and wearing shoes.  He also has painful corn fourth toe right foot. This patient presents for at risk foot care today.  General Appearance  Alert, conversant and in no acute stress.  Vascular  Dorsalis pedis and posterior tibial  pulses are palpable  bilaterally.  Capillary return is within normal limits  bilaterally. Temperature is within normal limits  bilaterally.  Neurologic  Senn-Weinstein monofilament wire test diminished bilaterally. Muscle power within normal limits bilaterally.  Nails Thick disfigured discolored nails with subungual debris  from hallux to fifth toes bilaterally. No evidence of bacterial infection or drainage bilaterally.  Orthopedic  No limitations of motion  feet .  No crepitus or effusions noted.  No bony pathology or digital deformities noted. DJD 1st  MPJ  B/L.  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    Skin  normotropic skin with no porokeratosis noted bilaterally.  No signs of infections or ulcers noted.   Corn lateral aspect fourth toe right foot.  Asymptomatic porokeratosis sub 4th left  Onychomycosis  Pain in right toes  Pain in left toes Corn fourth toe right foot.  Consent was obtained for treatment procedures.   Mechanical debridement of nails 1-5  bilaterally performed with a nail nipper.  Filed with dremel without incident.  Debride corn with # 15 blade. Patient to be measured for diabetic shoes.   Return office visit   10 weeks                  Told patient to return for periodic foot care and evaluation due to potential at risk complications.   Gardiner Barefoot DPM

## 2022-01-13 DIAGNOSIS — D509 Iron deficiency anemia, unspecified: Secondary | ICD-10-CM | POA: Diagnosis not present

## 2022-01-18 ENCOUNTER — Other Ambulatory Visit: Payer: Self-pay | Admitting: Cardiovascular Disease

## 2022-02-02 ENCOUNTER — Ambulatory Visit (HOSPITAL_COMMUNITY)
Admission: RE | Admit: 2022-02-02 | Discharge: 2022-02-02 | Disposition: A | Discharge status: Home / Self Care | Payer: Medicare HMO | Source: Ambulatory Visit | Attending: Internal Medicine | Admitting: Internal Medicine

## 2022-02-02 DIAGNOSIS — I6523 Occlusion and stenosis of bilateral carotid arteries: Secondary | ICD-10-CM | POA: Diagnosis not present

## 2022-02-10 ENCOUNTER — Inpatient Hospital Stay: Payer: Medicare HMO | Attending: Physician Assistant

## 2022-02-10 DIAGNOSIS — Z79899 Other long term (current) drug therapy: Secondary | ICD-10-CM | POA: Insufficient documentation

## 2022-02-10 DIAGNOSIS — D509 Iron deficiency anemia, unspecified: Secondary | ICD-10-CM | POA: Insufficient documentation

## 2022-02-10 LAB — CBC WITH DIFFERENTIAL/PLATELET
Abs Immature Granulocytes: 0.03 10*3/uL (ref 0.00–0.07)
Basophils Absolute: 0.1 10*3/uL (ref 0.0–0.1)
Basophils Relative: 1 %
Eosinophils Absolute: 0.3 10*3/uL (ref 0.0–0.5)
Eosinophils Relative: 4 %
HCT: 45.5 % (ref 39.0–52.0)
Hemoglobin: 14.6 g/dL (ref 13.0–17.0)
Immature Granulocytes: 0 %
Lymphocytes Relative: 16 %
Lymphs Abs: 1.4 10*3/uL (ref 0.7–4.0)
MCH: 28 pg (ref 26.0–34.0)
MCHC: 32.1 g/dL (ref 30.0–36.0)
MCV: 87.2 fL (ref 80.0–100.0)
Monocytes Absolute: 0.7 10*3/uL (ref 0.1–1.0)
Monocytes Relative: 9 %
Neutro Abs: 5.8 10*3/uL (ref 1.7–7.7)
Neutrophils Relative %: 70 %
Platelets: 278 10*3/uL (ref 150–400)
RBC: 5.22 MIL/uL (ref 4.22–5.81)
RDW: 17.3 % — ABNORMAL HIGH (ref 11.5–15.5)
WBC: 8.3 10*3/uL (ref 4.0–10.5)
nRBC: 0 % (ref 0.0–0.2)

## 2022-02-10 LAB — IRON AND TIBC
Iron: 45 ug/dL (ref 45–182)
Saturation Ratios: 14 % — ABNORMAL LOW (ref 17.9–39.5)
TIBC: 318 ug/dL (ref 250–450)
UIBC: 273 ug/dL

## 2022-02-10 LAB — FERRITIN: Ferritin: 45 ng/mL (ref 24–336)

## 2022-02-15 ENCOUNTER — Encounter: Payer: Self-pay | Admitting: Podiatry

## 2022-02-16 NOTE — Progress Notes (Unsigned)
Luke [name], [place] [id number]   CLINIC:  Medical Oncology/Hematology  PCP:  Celene Squibb, MD [place] [address] [phone]   REASON FOR VISIT:  Follow-up for iron deficiency anemia  PRIOR THERAPY: Oral iron  CURRENT THERAPY: Intermittent PRBCs transfusions & IV iron infusions  INTERVAL HISTORY:  Luke [name] 71 y.o. male returns for routine follow-up of his iron deficiency anemia.  He was last seen by Tarri Abernethy PA-C on 11/18/2021.  Most recent IV iron with Venofer 300 mg x 3 from 11/26/2021 through 12/23/2021.  At today's visit, he reports feeling much better following his IV iron infusions.  His energy has improved and his restless legs and pica symptoms have resolved. ***   He previously was having black stools that were present even before starting iron pills.  He is currently taking iron pill daily, but denies any black bowel movements at this time.  *** *** No bright red blood per rectum, melena, or epistaxis since his last visit. *** He denies any chest pain, dyspnea on exertion, lightheadedness, or syncope.  He continues to follow with Dr. Benson Norway (gastroenterology) of [facility].  *** Patient reports that he had EGD and colonoscopy around 10/14/2021 which were reportedly normal.  He has  *** % energy and  *** % appetite. He endorses that he is maintaining a stable weight.   REVIEW OF SYSTEMS:  ***  Review of Systems  Constitutional:  Negative for appetite change, chills, diaphoresis, fatigue, fever and unexpected weight change.  HENT:   Negative for lump/mass and nosebleeds.   Eyes:  Negative for eye problems.  Respiratory:  Negative for cough, hemoptysis and shortness of breath.   Cardiovascular:  Negative for chest pain, leg swelling and palpitations.  Gastrointestinal:  Negative for abdominal pain, blood in stool, constipation, diarrhea, nausea and vomiting.  Genitourinary:  Negative for  hematuria.   Skin: Negative.   Neurological:  Positive for numbness. Negative for dizziness, headaches and light-headedness.  Hematological:  Does not bruise/bleed easily.      PAST MEDICAL/SURGICAL HISTORY:  Past Medical History:  Diagnosis Date   CAD (coronary artery disease) 10/02/2010   nuclear study show normal perfusion on medical therapy without scar or ischemia. 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/19/2009   Sleep apnea    "trial mask in the 1990s; haven't used one since" (12/19/2017)   Type II diabetes mellitus ([place])    Past Surgical History:  Procedure Laterality Date   ABDOMINAL AORTOGRAM W/LOWER EXTREMITY N/A 12/19/2017   Procedure: ABDOMINAL AORTOGRAM W/LOWER EXTREMITY;  Surgeon: Lorretta Harp, MD;  Location: [facility];  Service: Cardiovascular;  Laterality: N/A;   CARDIAC CATHETERIZATION  08/1999   which revealed a mild coronary artery disease with 60 and 70 and 80% stenosis in a small first diagonal vessel at the LAD, 50% mid LAD stenosis, 50% ostial left circumflrx stenosis, and 40% ostial and proximal intermediate stenosis. he had 10 to 20% irregularities of his mid right coronary artery.   CHOLECYSTECTOMY N/A 07/08/2017   Procedure: LAPAROSCOPIC  CHOLECYSTECTOMY;  Surgeon: Aviva Signs, MD;  Location: [facility];  Service: General;  Laterality: N/A;   CIRCUMCISION  07/17/2012   Procedure: CIRCUMCISION ADULT;  Surgeon: Dutch Gray, MD;  Location: [facility];  Service: Urology;  Laterality: N/A;   COLONOSCOPY  02/14/06   M JENKINS   COLONOSCOPY N/A 10/28/2014   Procedure: COLONOSCOPY;  Surgeon: Rogene Houston, MD;  Location: [facility];   Service: Endoscopy;  Laterality: N/A;  730   ENTEROSCOPY N/A 10/04/2014   Procedure: ENTEROSCOPY;  Surgeon: Carol Ada, MD;  Location: [facility];  Service: Endoscopy;  Laterality: N/A;   ERCP  12/25/1996   ROURK   GIVENS CAPSULE STUDY N/A 10/04/2014   Procedure: GIVENS CAPSULE STUDY;  Surgeon: Carol Ada, MD;  Location: [facility];  Service: Endoscopy;  Laterality: N/A;   GIVENS CAPSULE STUDY N/A 08/04/2018   Procedure: GIVENS CAPSULE STUDY;  Surgeon: Rogene Houston, MD;  [facility];  Service: Endoscopy;  Laterality: N/A;  7:00am   JOINT REPLACEMENT     PERIPHERAL VASCULAR ATHERECTOMY Left 12/19/2017   Procedure: PERIPHERAL VASCULAR ATHERECTOMY;  Surgeon: Lorretta Harp, MD;  Location: [facility];  Service: Cardiovascular;  Laterality: Left;   PERIPHERAL VASCULAR INTERVENTION Left 12/19/2017   Procedure: PERIPHERAL VASCULAR INTERVENTION;  Surgeon: Lorretta Harp, MD;  Location: [facility];  Service: Cardiovascular;  Laterality: Left;  stent   SMALL BOWEL GIVENS  11/25/2008   TOTAL KNEE ARTHROPLASTY  2007-2008   UPPER GASTROINTESTINAL ENDOSCOPY  11/08/2008   EGD TCS     SOCIAL HISTORY:  Social History   Socioeconomic History   Marital status: Married    Spouse name: Not on file   Number of children: Not on file   Years of education: Not on file   Highest education level: Not on file  Occupational History   Not on file  Tobacco Use   Smoking status: Former    Packs/day: 2.00    Years: 50.00    Total pack years: 100.00    Types: Cigarettes    Quit date: 09/28/2014    Years since quitting: 7.3   Smokeless tobacco: Never  Vaping Use   Vaping Use: Never used  Substance and Sexual Activity   Alcohol use: Never    Alcohol/week: 0.0 standard drinks of alcohol   Drug use: Never   Sexual activity: Not Currently  Other Topics Concern   Not on file  Social History Narrative   Not on file   Social Determinants of Health   Financial  Resource Strain: Not on file  Food Insecurity: Not on file  Transportation Needs: Not on file  Physical Activity: Not on file  Stress: Not on file  Social Connections: Not on file  Intimate Partner Violence: Not on file    FAMILY HISTORY:  Family History  Problem Relation Age of Onset   Healthy Sister    Healthy Brother    Healthy Sister    Healthy Brother    Healthy Brother    Healthy Daughter    Healthy Daughter     CURRENT MEDICATIONS:  Outpatient Encounter Medications as of 02/17/2022  Medication Sig   amLODipine (NORVASC) 10 MG tablet TAKE 1 TABLET EVERY DAY   aspirin 81 MG tablet Take 1 tablet (81 mg total) by mouth daily. HOLD FOR 1 WEEK, THEN RESUME IF NO BLEEDING PROBLEMS   atorvastatin (LIPITOR) 40 MG tablet SMARTSIG:1 By Mouth   bisoprolol-hydrochlorothiazide (ZIAC) 2.5-6.25 MG tablet  clobetasol (TEMOVATE) 0.05 % external solution Apply 1 application. topically at bedtime.   empagliflozin (JARDIANCE) 25 MG TABS tablet Take 25 mg by mouth daily.    etodolac (LODINE XL) 400 MG 24 hr tablet    Ferrous Sulfate Dried (HIGH POTENCY IRON) 65 MG TABS    finasteride (PROSCAR) 5 MG tablet Take 5 mg by mouth daily.   Insulin Degludec-Liraglutide (XULTOPHY) 100-3.6 UNIT-MG/ML SOPN Inject 24 Units into the skin daily.    INVOKANA 300 MG TABS tablet    levocetirizine (XYZAL) 5 MG tablet levocetirizine 5 mg tablet   Loratadine 10 MG CAPS Claritin   metFORMIN (GLUCOPHAGE) 500 MG tablet Take 1,000 mg by mouth 2 (two) times daily.   metoprolol succinate (TOPROL-XL) 25 MG 24 hr tablet Take 25 mg by mouth daily.   Multiple Vitamins-Minerals (CENTRUM SILVER ULTRA MENS PO) Centrum Silver Ultra Men's   ONGLYZA 5 MG TABS tablet    pantoprazole (PROTONIX) 40 MG tablet TAKE 1 TABLET EVERY DAY   ramipril (ALTACE) 10 MG capsule TAKE 1 CAPSULE EVERY DAY   rOPINIRole (REQUIP) 0.25 MG tablet    tamsulosin (FLOMAX) 0.4 MG CAPS capsule Take 0.4 mg by mouth.   triamcinolone cream (KENALOG)  0.1 % Apply 1 application. topically 2 (two) times daily.   vitamin B-12 (CYANOCOBALAMIN) 100 MCG tablet Vitamin B-12   No facility-administered encounter medications on file as of 02/17/2022.    ALLERGIES:  Allergies  Allergen Reactions   Morphine And Related Itching     PHYSICAL EXAM:  ***  ECOG PERFORMANCE STATUS: 0 - Asymptomatic  There were no vitals filed for this visit. There were no vitals filed for this visit. Physical Exam Vitals reviewed.  Constitutional:      Appearance: Normal appearance.  Cardiovascular:     Rate and Rhythm: Normal rate and regular rhythm.     Pulses: Normal pulses.     Heart sounds: Normal heart sounds.  Pulmonary:     Effort: Pulmonary effort is normal.     Breath sounds: Normal breath sounds.  Abdominal:     Palpations: Abdomen is soft. There is no hepatomegaly, splenomegaly or mass.     Tenderness: There is no abdominal tenderness.  Musculoskeletal:     Right lower leg: No edema.     Left lower leg: No edema.  Lymphadenopathy:     Cervical: No cervical adenopathy.     Right cervical: No superficial, deep or posterior cervical adenopathy.    Left cervical: No superficial, deep or posterior cervical adenopathy.     Upper Body:     Right upper body: No supraclavicular or axillary adenopathy.     Left upper body: No supraclavicular or axillary adenopathy.  Neurological:     General: No focal deficit present.     Mental Status: He is alert and oriented to person, place, and time.  Psychiatric:        Mood and Affect: Mood normal.        Behavior: Behavior normal.    LABORATORY DATA:  I have reviewed the labs as listed.  CBC    Component Value Date/Time   WBC 8.3 02/10/2022 1049   RBC 5.22 02/10/2022 1049   HGB 14.6 02/10/2022 1049   HGB 11.0 (L) 12/14/2017 1119   HCT 45.5 02/10/2022 1049   HCT 36.2 (L) 12/14/2017 1119   PLT 278 02/10/2022 1049   PLT 414 12/14/2017 1119   MCV 87.2 02/10/2022 1049   MCV 72 (L) 12/14/2017 1119  MCH 28.0 02/10/2022 1049   MCHC 32.1 02/10/2022 1049   RDW 17.3 (H) 02/10/2022 1049   RDW 18.3 (H) 12/14/2017 1119   LYMPHSABS 1.4 02/10/2022 1049   MONOABS 0.7 02/10/2022 1049   EOSABS 0.3 02/10/2022 1049   BASOSABS 0.1 02/10/2022 1049      Latest Ref Rng & Units 12/20/2017    2:23 AM 12/14/2017   11:19 AM 07/07/2017   10:32 PM  CMP  Glucose 70 - 99 mg/dL 113  175  265   BUN 8 - 23 mg/dL '8  10  12   '$ Creatinine 0.61 - 1.24 mg/dL 0.82  0.80  0.89   Sodium 135 - 145 mmol/L 139  141  135   Potassium 3.5 - 5.1 mmol/L 3.6  4.7  3.5   Chloride 98 - 111 mmol/L 102  100  98   CO2 22 - 32 mmol/L '29  25  24   '$ Calcium 8.9 - 10.3 mg/dL 8.3  10.1  9.2   Total Protein 6.5 - 8.1 g/dL   6.8   Total Bilirubin 0.3 - 1.2 mg/dL   0.4   Alkaline Phos 38 - 126 U/L   69   AST 15 - 41 U/L   20   ALT 17 - 63 U/L   17     DIAGNOSTIC IMAGING:  I have independently reviewed the relevant imaging and discussed with the patient.  ASSESSMENT & PLAN: 1.  Severe microcytic anemia / iron deficiency anemia - Hematology work-up (09/30/2021) significant for severe iron deficiency anemia.  Additional labs showed normal creatinine, copper, B12, methylmalonic acid, folate.  Normal SPEP and LDH.  Low reticulocytes for degree of anemia that was present. 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  Also required blood transfusion 7 years ago. - Last iron with IV Venofer 300 mg x 3 from 11/26/2021 through 12/23/2021 - Symptoms improved after IV iron, with resolution of his fatigue, pica, and restless legs ***  - He reports having black stools.  Denies any bleeding per rectum. ***  - Most recent labs (02/10/2022): Hgb 14.6/MCV 87.2, ferritin 45, iron saturation 14%. - Most likely iron deficiency anemia from chronic blood loss. - Follows with Dr. Benson Norway of  [facility].  Reportedly had EGD/colonoscopy on 10/14/2021, which were normal per patient report. - PLAN: Anemia resolved but with persistent iron deficiency. - Recommend additional IV iron with Venofer 300 mg x 3. - Repeat labs and RTC in 4 months  ***   2.  Smoking history: - Patient has significant smoking history. - LDCT chest (11/13/2021): Lung RADS category 1S, negative for any signs of lung cancer, both signs of aortic atherosclerosis and left main and three-vessel CAD and findings suggestive of underlying COPD - PLAN: Continue annual LDCT chest as part of LCS program  3.  Social/family history: - Lives at home with his wife.  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  Smoked 2 packs/day for 50 years, quit in approximately 09-06-18. - Brother died of lung cancer.  Patient grew up in foster home and does not know much of family history.   PLAN SUMMARY & DISPOSITION: Venofer 300 mg x 3 ***  Labs and RTC in 4 months  All questions were answered. The patient knows to call  the clinic with any problems, questions or concerns.  Medical decision making: Low ***    Time spent on visit: I spent  ***  minutes counseling the patient face to face. The total time spent in the appointment was *** minutes and more than 50% was on counseling.   Harriett Rush, PA-C  ***

## 2022-02-17 ENCOUNTER — Encounter: Payer: Self-pay | Admitting: Physician Assistant

## 2022-02-17 ENCOUNTER — Inpatient Hospital Stay: Payer: Medicare HMO | Admitting: Physician Assistant

## 2022-02-17 VITALS — BP 160/66 | HR 90 | Temp 96.6°F | Resp 16 | Wt 170.9 lb

## 2022-02-17 DIAGNOSIS — Z79899 Other long term (current) drug therapy: Secondary | ICD-10-CM | POA: Diagnosis not present

## 2022-02-17 DIAGNOSIS — D509 Iron deficiency anemia, unspecified: Secondary | ICD-10-CM

## 2022-02-17 NOTE — Patient Instructions (Signed)
Luke [name] **   You were seen today by Tarri Abernethy PA-C for your follow-up visit.    IRON DEFICIENCY ANEMIA Your blood levels have improved!  You are no longer anemic. Your iron levels remain slightly low. Continue taking iron tablet daily. We will check your labs again in 3 months to see if your iron has continued to improve while taking iron pill.  If your iron level is still low or has dropped again, we may need to give additional IV iron. Call our office if you have any symptoms of worsening fatigue, cravings for ice chips, or other symptoms concerning for low iron.  FOLLOW-UP APPOINTMENT: Same-day labs and office visit in 3 months  ** Thank you for trusting me with your healthcare!  I strive to provide all of my patients with quality care at each visit.  If you receive a survey for this visit, I would be so grateful to you for taking the time to provide feedback.  Thank you in advance!  ~ Alayne Estrella                   Dr. Derek Jack   &   Tarri Abernethy, PA-C   - - - - - - - - - - - - - - - - - -    Thank you for choosing [place] at [facility] to provide your oncology and hematology care.  To afford each patient quality time with our provider, please arrive at least 15 minutes before your scheduled appointment time.   If you have a lab appointment with the [facility] please come in thru the Main Entrance and check in at the main information desk.  You need to re-schedule your appointment should you arrive 10 or more minutes late.  We strive to give you quality time with our providers, and arriving late affects you and other patients whose appointments are after yours.  Also, if you no show three or more times for appointments you may be dismissed from the clinic at the providers discretion.     Again, thank you for choosing [facility]  Our hope is  that these requests will decrease the amount of time that you wait before being seen by our physicians.       _____________________________________________________________  Should you have questions after your visit to [facility], please contact our office at [phone] and follow the prompts.  Our office hours are 8:00 a.m. and 4:30 p.m. Monday - Friday.  Please note that voicemails left after 4:00 p.m. may not be returned until the following business day.  We are closed weekends and major holidays.  You do have access to a nurse 24-7, just call the main number to the clinic [phone] and do not press any options, hold on the line and a nurse will answer the phone.    For prescription refill requests, have your pharmacy contact our office and allow 72 hours.

## 2022-03-24 DIAGNOSIS — E119 Type 2 diabetes mellitus without complications: Secondary | ICD-10-CM | POA: Diagnosis not present

## 2022-03-24 DIAGNOSIS — Z794 Long term (current) use of insulin: Secondary | ICD-10-CM | POA: Diagnosis not present

## 2022-03-24 DIAGNOSIS — H25813 Combined forms of age-related cataract, bilateral: Secondary | ICD-10-CM | POA: Diagnosis not present

## 2022-04-07 DIAGNOSIS — E782 Mixed hyperlipidemia: Secondary | ICD-10-CM | POA: Diagnosis not present

## 2022-04-07 DIAGNOSIS — E538 Deficiency of other specified B group vitamins: Secondary | ICD-10-CM | POA: Diagnosis not present

## 2022-04-07 DIAGNOSIS — I10 Essential (primary) hypertension: Secondary | ICD-10-CM | POA: Diagnosis not present

## 2022-04-07 DIAGNOSIS — E1169 Type 2 diabetes mellitus with other specified complication: Secondary | ICD-10-CM | POA: Diagnosis not present

## 2022-04-07 DIAGNOSIS — D509 Iron deficiency anemia, unspecified: Secondary | ICD-10-CM | POA: Diagnosis not present

## 2022-04-07 DIAGNOSIS — R5383 Other fatigue: Secondary | ICD-10-CM | POA: Diagnosis not present

## 2022-04-07 DIAGNOSIS — D519 Vitamin B12 deficiency anemia, unspecified: Secondary | ICD-10-CM | POA: Diagnosis not present

## 2022-04-14 ENCOUNTER — Ambulatory Visit: Payer: Medicare HMO | Admitting: Podiatry

## 2022-04-14 ENCOUNTER — Encounter: Payer: Self-pay | Admitting: Podiatry

## 2022-04-14 DIAGNOSIS — D509 Iron deficiency anemia, unspecified: Secondary | ICD-10-CM | POA: Diagnosis not present

## 2022-04-14 DIAGNOSIS — E119 Type 2 diabetes mellitus without complications: Secondary | ICD-10-CM | POA: Diagnosis not present

## 2022-04-14 DIAGNOSIS — M79674 Pain in right toe(s): Secondary | ICD-10-CM

## 2022-04-14 DIAGNOSIS — M79675 Pain in left toe(s): Secondary | ICD-10-CM | POA: Diagnosis not present

## 2022-04-14 DIAGNOSIS — M2041 Other hammer toe(s) (acquired), right foot: Secondary | ICD-10-CM | POA: Diagnosis not present

## 2022-04-14 DIAGNOSIS — I10 Essential (primary) hypertension: Secondary | ICD-10-CM | POA: Diagnosis not present

## 2022-04-14 DIAGNOSIS — M205X2 Other deformities of toe(s) (acquired), left foot: Secondary | ICD-10-CM | POA: Diagnosis not present

## 2022-04-14 DIAGNOSIS — E1169 Type 2 diabetes mellitus with other specified complication: Secondary | ICD-10-CM | POA: Diagnosis not present

## 2022-04-14 DIAGNOSIS — M205X1 Other deformities of toe(s) (acquired), right foot: Secondary | ICD-10-CM | POA: Diagnosis not present

## 2022-04-14 DIAGNOSIS — E782 Mixed hyperlipidemia: Secondary | ICD-10-CM | POA: Diagnosis not present

## 2022-04-14 DIAGNOSIS — L84 Corns and callosities: Secondary | ICD-10-CM | POA: Diagnosis not present

## 2022-04-14 DIAGNOSIS — G4710 Hypersomnia, unspecified: Secondary | ICD-10-CM | POA: Diagnosis not present

## 2022-04-14 DIAGNOSIS — B351 Tinea unguium: Secondary | ICD-10-CM

## 2022-04-14 DIAGNOSIS — M2042 Other hammer toe(s) (acquired), left foot: Secondary | ICD-10-CM | POA: Diagnosis not present

## 2022-04-14 DIAGNOSIS — I739 Peripheral vascular disease, unspecified: Secondary | ICD-10-CM

## 2022-04-14 DIAGNOSIS — F630 Pathological gambling: Secondary | ICD-10-CM | POA: Diagnosis not present

## 2022-04-14 DIAGNOSIS — N400 Enlarged prostate without lower urinary tract symptoms: Secondary | ICD-10-CM | POA: Diagnosis not present

## 2022-04-14 DIAGNOSIS — Z23 Encounter for immunization: Secondary | ICD-10-CM | POA: Diagnosis not present

## 2022-04-14 NOTE — Progress Notes (Signed)
This patient returns to my office for at risk foot care.  This patient requires this care by a professional since this patient will be at risk due to having diabetes and PAD.   This patient is unable to cut nails himself since the patient cannot reach his nails.These nails are painful walking and wearing shoes.  He also has painful corn fourth toe right foot. This patient presents for at risk foot care today.  General Appearance  Alert, conversant and in no acute stress.  Vascular  Dorsalis pedis and posterior tibial  pulses are palpable  bilaterally.  Capillary return is within normal limits  bilaterally. Temperature is within normal limits  bilaterally.  Neurologic  Senn-Weinstein monofilament wire test diminished bilaterally. Muscle power within normal limits bilaterally.  Nails Thick disfigured discolored nails with subungual debris  from hallux to fifth toes bilaterally. No evidence of bacterial infection or drainage bilaterally.  Orthopedic  No limitations of motion  feet .  No crepitus or effusions noted.  No bony pathology or digital deformities noted. DJD 1st  MPJ  B/L.  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    Skin  normotropic skin with no porokeratosis noted bilaterally.  No signs of infections or ulcers noted.   Corn lateral aspect fourth toe right foot.  Symptomatic porokeratosis sub 4th left  Onychomycosis  Pain in right toes  Pain in left toes Corn fourth toe right foot.  Consent was obtained for treatment procedures.   Mechanical debridement of nails 1-5  bilaterally performed with a nail nipper.  Filed with dremel without incident.  Debride corn with # 15 blade.    Return office visit   10 weeks                  Told patient to return for periodic foot care and evaluation due to potential at risk complications.   Gardiner Barefoot DPM

## 2022-04-20 DIAGNOSIS — H25811 Combined forms of age-related cataract, right eye: Secondary | ICD-10-CM | POA: Diagnosis not present

## 2022-05-02 ENCOUNTER — Encounter (INDEPENDENT_AMBULATORY_CARE_PROVIDER_SITE_OTHER): Payer: Self-pay | Admitting: Gastroenterology

## 2022-05-05 ENCOUNTER — Other Ambulatory Visit: Payer: Self-pay | Admitting: Cardiovascular Disease

## 2022-05-07 DIAGNOSIS — R059 Cough, unspecified: Secondary | ICD-10-CM | POA: Diagnosis not present

## 2022-05-07 DIAGNOSIS — J069 Acute upper respiratory infection, unspecified: Secondary | ICD-10-CM | POA: Diagnosis not present

## 2022-05-17 NOTE — Progress Notes (Unsigned)
Luke [name], [place] [id number]   CLINIC:  Medical Oncology/Hematology  PCP:  Luke Squibb, MD [place] [address] [phone]   REASON FOR VISIT:  Follow-up for iron deficiency anemia  PRIOR THERAPY: Oral iron  CURRENT THERAPY: Intermittent PRBCs transfusions & IV iron infusions  INTERVAL HISTORY:  [name] 71 y.o. male returns for routine follow-up of his iron deficiency anemia.  He was last seen by Luke Abernethy PA-C on 02/17/2022.  Most recent IV iron with Venofer 300 mg x 3 from 11/26/2021 through 12/23/2021.  At today's visit, he reports continuing to feel improved energy after his last iron in June/July 2023.  *** Restless legs and pica symptoms have resolved.  *** He previously was having black stools that were present even before starting iron pills; now aking iron pill daily, but denies any black bowel movements at this time.  *** No bright red blood per rectum, melena, or epistaxis since his last visit.  *** He denies any chest pain, dyspnea on exertion, lightheadedness, or syncope.  He has ***% energy and ***% appetite. He endorses that he is maintaining a stable weight.   REVIEW OF SYSTEMS:   *** Review of Systems  Constitutional:  Negative for appetite change, chills, diaphoresis, fatigue, fever and unexpected weight change.  HENT:   Negative for lump/mass and nosebleeds.   Eyes:  Negative for eye problems.  Respiratory:  Negative for cough, hemoptysis and shortness of breath.   Cardiovascular:  Negative for chest pain, leg swelling and palpitations.  Gastrointestinal:  Positive for nausea. Negative for abdominal pain, blood in stool, constipation, diarrhea and vomiting.  Genitourinary:  Negative for hematuria.   Skin: Negative.   Neurological:  Negative for dizziness, headaches and light-headedness.  Hematological:  Does not bruise/bleed easily.      PAST MEDICAL/SURGICAL HISTORY:  Past Medical  History:  Diagnosis Date   CAD (coronary artery disease) 10/02/2010   nuclear study show normal perfusion on medical therapy without scar or ischemia. EF 64%   GERD (gastroesophageal reflux disease)    Heart murmur    "mild one" (12/19/2017)   Hematochezia 11/19/2009   History of blood transfusion    "low HgB" (12/19/2017)   Hyperlipidemia    Hypertension    Iron deficiency anemia 11/19/2009   Osteoarthritis    "was in my knees" (12/19/2017)   PAD (peripheral artery disease) (Luke [name])    Recurrent pancreatitis 11/19/2009   Sleep apnea    "trial mask in the 1990s; haven't used one since" (12/19/2017)   Type II diabetes mellitus ([place])    Past Surgical History:  Procedure Laterality Date   ABDOMINAL AORTOGRAM W/LOWER EXTREMITY N/A 12/19/2017   Procedure: ABDOMINAL AORTOGRAM W/LOWER EXTREMITY;  Surgeon: Luke Harp, MD;  Location: [facility];  Service: Cardiovascular;  Laterality: N/A;   CARDIAC CATHETERIZATION  08/1999   which revealed a mild coronary artery disease with 60 and 70 and 80% stenosis in a small first diagonal vessel at the LAD, 50% mid LAD stenosis, 50% ostial left circumflrx stenosis, and 40% ostial and proximal intermediate stenosis. he had 10 to 20% irregularities of his mid right coronary artery.   CHOLECYSTECTOMY N/A 07/08/2017   Procedure: LAPAROSCOPIC CHOLECYSTECTOMY;  Surgeon: Luke Signs, MD;  Location: [facility];  Service: General;  Laterality: N/A;   CIRCUMCISION  07/17/2012   Procedure: CIRCUMCISION ADULT;  Surgeon: Luke Gray, MD;  Location: [facility];  Service: Urology;  Laterality: N/A;   COLONOSCOPY  02/14/06   Luke [name]   COLONOSCOPY N/A 10/28/2014   Procedure: COLONOSCOPY;  Surgeon: Luke Houston, MD;  Location: [facility];  Service: Endoscopy;  Laterality: N/A;  730   ENTEROSCOPY N/A 10/04/2014   Procedure: ENTEROSCOPY;  Surgeon: Luke Ada, MD;  Location: [facility];  Service: Endoscopy;  Laterality: N/A;   ERCP  12/25/1996   Luke [name]   Luke [name]  CAPSULE STUDY N/A 10/04/2014   Procedure: Luke [name] CAPSULE STUDY;  Surgeon: Luke Ada, MD;  Location: [facility];  Service: Endoscopy;  Laterality: N/A;   Luke [name] CAPSULE STUDY N/A 08/04/2018   Procedure: Luke [name] CAPSULE STUDY;  Surgeon: Luke Houston, MD;  [facility];  Service: Endoscopy;  Laterality: N/A;  7:00am   JOINT REPLACEMENT     PERIPHERAL VASCULAR ATHERECTOMY Left 12/19/2017   Procedure: PERIPHERAL VASCULAR ATHERECTOMY;  Surgeon: Luke Harp, MD;  Location: [facility];  Service: Cardiovascular;  Laterality: Left;   PERIPHERAL VASCULAR INTERVENTION Left 12/19/2017   Procedure: PERIPHERAL VASCULAR INTERVENTION;  Surgeon: Luke Harp, MD;  Location: [facility];  Service: Cardiovascular;  Laterality: Left;  stent   SMALL BOWEL Luke [name]  11/25/2008   TOTAL KNEE ARTHROPLASTY  2007-2008   UPPER GASTROINTESTINAL ENDOSCOPY  11/08/2008   EGD TCS     SOCIAL HISTORY:  Social History   Socioeconomic History   Marital status: Married    Spouse name: Not on file   Number of children: Not on file   Years of education: Not on file   Highest education level: Not on file  Occupational History   Not on file  Tobacco Use   Smoking status: Former    Packs/day: 2.00    Years: 50.00    Total pack years: 100.00    Types: Cigarettes    Quit date: 09/28/2014    Years since quitting: 7.6   Smokeless tobacco: Never  Vaping Use   Vaping Use: Never used  Substance and Sexual Activity   Alcohol use: Never    Alcohol/week: 0.0 standard drinks of alcohol   Drug use: Never   Sexual activity: Not Currently  Other Topics Concern   Not on file  Social History Narrative   Not on file   Social Determinants of Health   Financial Resource Strain: Not on file  Food Insecurity: Not on file  Transportation Needs: Not on file  Physical Activity: Not on file  Stress: Not on file  Social Connections: Not on file  Intimate Partner Violence: Not on file     FAMILY HISTORY:  Family History  Problem Relation Age of Onset   Healthy Sister    Healthy Brother    Healthy Sister    Healthy Brother    Healthy Brother    Healthy Daughter    Healthy Daughter     CURRENT MEDICATIONS:  Outpatient Encounter Medications as of 05/18/2022  Medication Sig   amLODipine (NORVASC) 10 MG tablet TAKE 1 TABLET EVERY DAY   aspirin 81 MG tablet Take 1 tablet (81 mg total) by mouth daily. HOLD FOR 1 WEEK, THEN RESUME IF NO BLEEDING PROBLEMS   atorvastatin (LIPITOR) 40 MG tablet SMARTSIG:1 By Mouth   bisoprolol-hydrochlorothiazide (ZIAC) 2.5-6.25 MG tablet    clobetasol (TEMOVATE) 0.05 % external solution Apply 1 application. topically at bedtime.   empagliflozin (JARDIANCE) 25 MG TABS tablet Take 25 mg by mouth daily.    etodolac (LODINE XL) 400 MG 24 hr tablet  Ferrous Sulfate Dried (HIGH POTENCY IRON) 65 MG TABS    finasteride (PROSCAR) 5 MG tablet Take 5 mg by mouth daily.   Insulin Degludec-Liraglutide (XULTOPHY) 100-3.6 UNIT-MG/ML SOPN Inject 24 Units into the skin daily.    INVOKANA 300 MG TABS tablet    levocetirizine (XYZAL) 5 MG tablet levocetirizine 5 mg tablet   Loratadine 10 MG CAPS Claritin   metFORMIN (GLUCOPHAGE) 500 MG tablet Take 1,000 mg by mouth 2 (two) times daily.   metoprolol succinate (TOPROL-XL) 25 MG 24 hr tablet Take 25 mg by mouth daily.   Multiple Vitamins-Minerals (CENTRUM SILVER ULTRA MENS PO) Centrum Silver Ultra Men's   ONGLYZA 5 MG TABS tablet    pantoprazole (PROTONIX) 40 MG tablet TAKE 1 TABLET EVERY DAY   ramipril (ALTACE) 10 MG capsule TAKE 1 CAPSULE EVERY DAY   rOPINIRole (REQUIP) 0.25 MG tablet    tamsulosin (FLOMAX) 0.4 MG CAPS capsule Take 0.4 mg by mouth.   vitamin B-12 (CYANOCOBALAMIN) 100 MCG tablet Vitamin B-12   No facility-administered encounter medications on file as of 05/18/2022.    ALLERGIES:  Allergies  Allergen Reactions   Morphine And Related Itching     PHYSICAL EXAM:    ECOG  PERFORMANCE STATUS: 0 - Asymptomatic *** There were no vitals filed for this visit. There were no vitals filed for this visit. Physical Exam Vitals reviewed.  Constitutional:      Appearance: Normal appearance.  Cardiovascular:     Rate and Rhythm: Normal rate and regular rhythm.     Pulses: Normal pulses.     Heart sounds: Normal heart sounds.  Pulmonary:     Effort: Pulmonary effort is normal.     Breath sounds: Normal breath sounds.  Abdominal:     Palpations: Abdomen is soft. There is no hepatomegaly, splenomegaly or mass.     Tenderness: There is no abdominal tenderness.  Musculoskeletal:     Right lower leg: No edema.     Left lower leg: No edema.  Lymphadenopathy:     Cervical: No cervical adenopathy.     Right cervical: No superficial, deep or posterior cervical adenopathy.    Left cervical: No superficial, deep or posterior cervical adenopathy.     Upper Body:     Right upper body: No supraclavicular or axillary adenopathy.     Left upper body: No supraclavicular or axillary adenopathy.  Neurological:     General: No focal deficit present.     Mental Status: He is alert and oriented to person, place, and time.  Psychiatric:        Mood and Affect: Mood normal.        Behavior: Behavior normal.     LABORATORY DATA:  I have reviewed the labs as listed.  CBC    Component Value Date/Time   WBC 8.3 02/10/2022 1049   RBC 5.22 02/10/2022 1049   HGB 14.6 02/10/2022 1049   HGB 11.0 (L) 12/14/2017 1119   HCT 45.5 02/10/2022 1049   HCT 36.2 (L) 12/14/2017 1119   PLT 278 02/10/2022 1049   PLT 414 12/14/2017 1119   MCV 87.2 02/10/2022 1049   MCV 72 (L) 12/14/2017 1119   MCH 28.0 02/10/2022 1049   MCHC 32.1 02/10/2022 1049   RDW 17.3 (H) 02/10/2022 1049   RDW 18.3 (H) 12/14/2017 1119   LYMPHSABS 1.4 02/10/2022 1049   MONOABS 0.7 02/10/2022 1049   EOSABS 0.3 02/10/2022 1049   BASOSABS 0.1 02/10/2022 1049      Latest  Ref Rng & Units 12/20/2017    2:23 AM  12/14/2017   11:19 AM 07/07/2017   10:32 PM  CMP  Glucose 70 - 99 mg/dL 113  175  265   BUN 8 - 23 mg/dL '8  10  12   '$ Creatinine 0.61 - 1.24 mg/dL 0.82  0.80  0.89   Sodium 135 - 145 mmol/L 139  141  135   Potassium 3.5 - 5.1 mmol/L 3.6  4.7  3.5   Chloride 98 - 111 mmol/L 102  100  98   CO2 22 - 32 mmol/L '29  25  24   '$ Calcium 8.9 - 10.3 mg/dL 8.3  10.1  9.2   Total Protein 6.5 - 8.1 g/dL   6.8   Total Bilirubin 0.3 - 1.2 mg/dL   0.4   Alkaline Phos 38 - 126 U/L   69   AST 15 - 41 U/L   20   ALT 17 - 63 U/L   17     DIAGNOSTIC IMAGING:  I have independently reviewed the relevant imaging and discussed with the patient.  ASSESSMENT & PLAN: 1.  Severe microcytic anemia / iron deficiency anemia - Hematology work-up (09/30/2021) significant for severe iron deficiency anemia.  Additional labs showed normal creatinine, copper, B12, methylmalonic acid, folate.  Normal SPEP and LDH.  Low reticulocytes for degree of anemia that was present. 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  Also required blood transfusion 7 years ago. - Last iron with IV Venofer 300 mg x 3 from 11/26/2021 through 12/23/2021 - Symptoms improved after IV iron, with resolution of his fatigue, pica, and restless legs  *** - He reports having intermittent black stools.***  Denies any bleeding per rectum. ***  - Most recent labs (05/18/2022): ***- Most likely iron deficiency anemia from chronic blood loss. - Follows with Dr. Benson Norway of [facility].  Reportedly had EGD/colonoscopy on 10/14/2021, which were normal per patient report. - PLAN: Anemia resolved but with persistent iron deficiency.*** - We will hold off on additional IV iron for now and see if he continues to improve on oral iron alone*** - Repeat labs and RTC in 3 months     2.  Smoking  history: - Patient has significant smoking history. - LDCT chest (11/13/2021): Lung RADS category 1S, negative for any [name] of lung cancer, both [name] of aortic atherosclerosis and left main and three-vessel CAD and findings suggestive of underlying COPD - PLAN: Continue annual LDCT chest as part of LCS program - next due May 2024  3.  Social/family history: - Lives at home with his wife.  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  Smoked 2 packs/day for 50 years, quit in approximately 2018/09/02. - Brother died of lung cancer.  Patient grew up in foster home and does not know much of family history.   PLAN SUMMARY: >> *** >> *** >> ***   All questions were answered. The patient knows to call the clinic with any problems, questions or concerns.  Medical decision making: ***     Time spent on visit: I spent *** minutes counseling the patient face to face. The total time spent in the appointment was *** minutes and more than 50% was on counseling.   Collier Bullock  Leron Croak, PA-C  ***

## 2022-05-18 ENCOUNTER — Inpatient Hospital Stay: Payer: Medicare HMO | Attending: Physician Assistant | Admitting: Physician Assistant

## 2022-05-18 ENCOUNTER — Inpatient Hospital Stay: Payer: Medicare HMO

## 2022-05-18 ENCOUNTER — Other Ambulatory Visit: Payer: Self-pay

## 2022-05-18 VITALS — BP 153/82 | HR 75 | Temp 96.6°F | Resp 20 | Ht 65.75 in | Wt 172.8 lb

## 2022-05-18 DIAGNOSIS — D500 Iron deficiency anemia secondary to blood loss (chronic): Secondary | ICD-10-CM

## 2022-05-18 DIAGNOSIS — D509 Iron deficiency anemia, unspecified: Secondary | ICD-10-CM | POA: Diagnosis not present

## 2022-05-18 DIAGNOSIS — Z87891 Personal history of nicotine dependence: Secondary | ICD-10-CM | POA: Insufficient documentation

## 2022-05-18 DIAGNOSIS — Z79899 Other long term (current) drug therapy: Secondary | ICD-10-CM | POA: Diagnosis not present

## 2022-05-18 LAB — CBC WITH DIFFERENTIAL/PLATELET
Abs Immature Granulocytes: 0.03 10*3/uL (ref 0.00–0.07)
Basophils Absolute: 0 10*3/uL (ref 0.0–0.1)
Basophils Relative: 1 %
Eosinophils Absolute: 0.1 10*3/uL (ref 0.0–0.5)
Eosinophils Relative: 1 %
HCT: 47 % (ref 39.0–52.0)
Hemoglobin: 15.1 g/dL (ref 13.0–17.0)
Immature Granulocytes: 0 %
Lymphocytes Relative: 17 %
Lymphs Abs: 1.5 10*3/uL (ref 0.7–4.0)
MCH: 28.7 pg (ref 26.0–34.0)
MCHC: 32.1 g/dL (ref 30.0–36.0)
MCV: 89.4 fL (ref 80.0–100.0)
Monocytes Absolute: 0.7 10*3/uL (ref 0.1–1.0)
Monocytes Relative: 8 %
Neutro Abs: 6.2 10*3/uL (ref 1.7–7.7)
Neutrophils Relative %: 73 %
Platelets: 286 10*3/uL (ref 150–400)
RBC: 5.26 MIL/uL (ref 4.22–5.81)
RDW: 15.2 % (ref 11.5–15.5)
WBC: 8.5 10*3/uL (ref 4.0–10.5)
nRBC: 0 % (ref 0.0–0.2)

## 2022-05-18 LAB — IRON AND TIBC
Iron: 48 ug/dL (ref 45–182)
Saturation Ratios: 13 % — ABNORMAL LOW (ref 17.9–39.5)
TIBC: 378 ug/dL (ref 250–450)
UIBC: 330 ug/dL

## 2022-05-18 LAB — FERRITIN: Ferritin: 20 ng/mL — ABNORMAL LOW (ref 24–336)

## 2022-05-18 NOTE — Patient Instructions (Signed)
[facility] **   You were seen today by Tarri Abernethy PA-C for your follow-up visit.    IRON DEFICIENCY ANEMIA Your blood levels look good.  You are not anemic at this time. However, your iron levels have dropped lower than they were at your last appointment. We will schedule you for IV iron x 2 doses. Continue taking iron tablet daily. We will check your labs again in 6 months and schedule you for a phone visit after those labs have been completed. Call our office if you have any symptoms of worsening fatigue, cravings for ice chips, or other symptoms concerning for low iron.  FOLLOW-UP APPOINTMENT: Labs in 6 months with phone visit 1 week after.  ** Thank you for trusting me with your healthcare!  I strive to provide all of my patients with quality care at each visit.  If you receive a survey for this visit, I would be so grateful to you for taking the time to provide feedback.  Thank you in advance!  ~ Saagar Tortorella                   Dr. Derek Jack   &   Tarri Abernethy, PA-C   - - - - - - - - - - - - - - - - - -    Thank you for choosing [facility] at [facility] to provide your oncology and hematology care.  To afford each patient quality time with our provider, please arrive at least 15 minutes before your scheduled appointment time.   If you have a lab appointment with the [facility] please come in thru the Main Entrance and check in at the main information desk.  You need to re-schedule your appointment should you arrive 10 or more minutes late.  We strive to give you quality time with our providers, and arriving late affects you and other patients whose appointments are after yours.  Also, if you no show three or more times for appointments you may be dismissed from the clinic at the providers discretion.     Again, thank you for choosing [facility].   Our hope is that these requests will decrease the amount of time that you wait before being seen by our physicians.       _____________________________________________________________  Should you have questions after your visit to [facility], please contact our office at [phone] and follow the prompts.  Our office hours are 8:00 a.m. and 4:30 p.m. Monday - Friday.  Please note that voicemails left after 4:00 p.m. may not be returned until the following business day.  We are closed weekends and major holidays.  You do have access to a nurse 24-7, just call the main number to the clinic [phone] and do not press any options, hold on the line and a nurse will answer the phone.    For prescription refill requests, have your pharmacy contact our office and allow 72 hours.

## 2022-05-24 ENCOUNTER — Telehealth: Payer: Self-pay | Admitting: Podiatry

## 2022-05-24 NOTE — Telephone Encounter (Signed)
Left message on voicemail for patient to call back to schedule appt to try on diabetic shoes

## 2022-05-27 ENCOUNTER — Encounter: Payer: Self-pay | Admitting: Cardiovascular Disease

## 2022-05-27 ENCOUNTER — Other Ambulatory Visit: Payer: Self-pay

## 2022-05-27 ENCOUNTER — Other Ambulatory Visit: Payer: Self-pay | Admitting: *Deleted

## 2022-05-27 MED ORDER — ATORVASTATIN CALCIUM 80 MG PO TABS: 80.0000 mg | ORAL_TABLET | Freq: Every day | ORAL | 2 refills | Status: DC

## 2022-06-02 ENCOUNTER — Ambulatory Visit: Payer: Medicare HMO | Admitting: Podiatry

## 2022-06-02 ENCOUNTER — Encounter: Payer: Self-pay | Admitting: Podiatry

## 2022-06-02 DIAGNOSIS — E1149 Type 2 diabetes mellitus with other diabetic neurological complication: Secondary | ICD-10-CM | POA: Diagnosis not present

## 2022-06-02 DIAGNOSIS — M7751 Other enthesopathy of right foot: Secondary | ICD-10-CM

## 2022-06-02 DIAGNOSIS — L84 Corns and callosities: Secondary | ICD-10-CM

## 2022-06-02 DIAGNOSIS — E1140 Type 2 diabetes mellitus with diabetic neuropathy, unspecified: Secondary | ICD-10-CM | POA: Diagnosis not present

## 2022-06-02 DIAGNOSIS — H2512 Age-related nuclear cataract, left eye: Secondary | ICD-10-CM | POA: Diagnosis not present

## 2022-06-02 MED ORDER — TRIAMCINOLONE ACETONIDE 10 MG/ML IJ SUSP
10.0000 mg | Freq: Once | INTRAMUSCULAR | Status: AC
  Administered 2022-06-02: 10 mg

## 2022-06-02 NOTE — Progress Notes (Signed)
Patient presents today to pick up diabetic shoes and insoles.  Patient was dispensed 1 pair of diabetic shoes and 3 pairs of foam casted diabetic insoles. Fit was satisfactory. Instructions for break-in and wear was reviewed and a copy was given to the patient.   Re-appointment for regularly scheduled diabetic foot care visits or if they should experience any trouble with the shoes or insoles.  

## 2022-06-02 NOTE — Progress Notes (Signed)
Subjective:   Patient ID: Luke [name], male   DOB: 71 y.o.   MRN: [id number]   HPI Patient presents stating that he has a very painful lesion on his fourth toe right foot with long-term diabetes and has chronic keratotic lesions both feet plantar and is here to pick up his diabetic shoes   ROS      Objective:  Physical Exam  Neurological diminished sharp dull vibratory consistent with his long-term diabetes with digital deformities bilateral and inflammation of the inner phalangeal joint digit 4 right painful when pressed with lesions plantar aspect left over right foot     Assessment:  Inflammatory capsulitis of the inner phalangeal joint digit 4 right 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

## 2022-06-03 ENCOUNTER — Inpatient Hospital Stay: Payer: Medicare HMO | Attending: Physician Assistant

## 2022-06-03 VITALS — BP 155/70 | HR 66 | Temp 97.0°F | Resp 18

## 2022-06-03 DIAGNOSIS — D509 Iron deficiency anemia, unspecified: Secondary | ICD-10-CM | POA: Diagnosis not present

## 2022-06-03 MED ORDER — SODIUM CHLORIDE 0.9 % IV SOLN: Freq: Once | INTRAVENOUS | Status: AC

## 2022-06-03 MED ORDER — SODIUM CHLORIDE 0.9 % IV SOLN
300.0000 mg | Freq: Once | INTRAVENOUS | Status: AC
  Administered 2022-06-03: 300 mg via INTRAVENOUS
  Filled 2022-06-03: qty 300

## 2022-06-03 NOTE — Patient Instructions (Signed)
[facility]  Discharge Instructions: Thank you for choosing [facility] to provide your oncology and hematology care.  If you have a lab appointment with the Cancer Center, please come in thru the Main Entrance and check in at the main information desk.  Wear comfortable clothing and clothing appropriate for easy access to any Portacath or PICC line.   We strive to give you quality time with your provider. You may need to reschedule your appointment if you arrive late (15 or more minutes).  Arriving late affects you and other patients whose appointments are after yours.  Also, if you miss three or more appointments without notifying the office, you may be dismissed from the clinic at the provider's discretion.      For prescription refill requests, have your pharmacy contact our office and allow 72 hours for refills to be completed.     To help prevent nausea and vomiting after your treatment, we encourage you to take your nausea medication as directed.  BELOW ARE SYMPTOMS THAT SHOULD BE REPORTED IMMEDIATELY: *FEVER GREATER THAN 100.4 F (38 C) OR HIGHER *CHILLS OR SWEATING *NAUSEA AND VOMITING THAT IS NOT CONTROLLED WITH YOUR NAUSEA MEDICATION *UNUSUAL SHORTNESS OF BREATH *UNUSUAL BRUISING OR BLEEDING *URINARY PROBLEMS (pain or burning when urinating, or frequent urination) *BOWEL PROBLEMS (unusual diarrhea, constipation, pain near the anus) TENDERNESS IN MOUTH AND THROAT WITH OR WITHOUT PRESENCE OF ULCERS (sore throat, sores in mouth, or a toothache) UNUSUAL RASH, SWELLING OR PAIN  UNUSUAL VAGINAL DISCHARGE OR ITCHING   Items with * indicate a potential emergency and should be followed up as soon as possible or go to the Emergency Department if any problems should occur.  Please show the CHEMOTHERAPY ALERT CARD or IMMUNOTHERAPY ALERT CARD at check-in to the Emergency Department and triage nurse.  Should you have questions after your visit or need to  cancel or reschedule your appointment, please contact [facility] [phone]  and follow the prompts.  Office hours are 8:00 a.m. to 4:30 p.m. Monday - Friday. Please note that voicemails left after 4:00 p.m. may not be returned until the following business day.  We are closed weekends and major holidays. You have access to a nurse at all times for urgent questions. Please call the main number to the clinic [phone] and follow the prompts.  For any non-urgent questions, you may also contact your provider using MyChart. We now offer e-Visits for anyone 18 and older to request care online for non-urgent symptoms. For details visit [email or web address].   Also download the MyChart app! Go to the app store, search "MyChart", open the app, select [facility], and log in with your MyChart username and password.  Masks are optional in the cancer centers. If you would like for your care team to wear a mask while they are taking care of you, please let them know. You may have one support person who is at least 72 years old accompany you for your appointments.  

## 2022-06-03 NOTE — Progress Notes (Signed)
Patient presents today for iron infusion.  Patient is in satisfactory condition with no complaints voiced.  Vital signs are stable.  Pre-medications were taken at 0715 at home prior to visit.  We will proceed with infusion per provider orders.   Patient tolerated infusion well with no complaints voiced.  Patient refused to wait the recommended 30 minute post iron wait time.  Patient left ambulatory in stable condition.  Vital signs stable at discharge.  Follow up as scheduled.

## 2022-06-04 DIAGNOSIS — H25812 Combined forms of age-related cataract, left eye: Secondary | ICD-10-CM | POA: Diagnosis not present

## 2022-06-10 ENCOUNTER — Inpatient Hospital Stay: Payer: Medicare HMO

## 2022-06-10 VITALS — BP 137/66 | HR 60 | Temp 97.0°F | Resp 18

## 2022-06-10 DIAGNOSIS — D509 Iron deficiency anemia, unspecified: Secondary | ICD-10-CM | POA: Diagnosis not present

## 2022-06-10 MED ORDER — SODIUM CHLORIDE 0.9 % IV SOLN
300.0000 mg | Freq: Once | INTRAVENOUS | Status: AC
  Administered 2022-06-10: 300 mg via INTRAVENOUS
  Filled 2022-06-10: qty 300

## 2022-06-10 MED ORDER — SODIUM CHLORIDE 0.9 % IV SOLN: Freq: Once | INTRAVENOUS | Status: AC

## 2022-06-10 NOTE — Progress Notes (Signed)
Patient presents today for iron infusion.  Patient is in satisfactory condition with no complaints voiced.  Vital signs are stable.  Tylenol and Claritin taken at 0730 at home prior to visit.  We will proceed with infusion per provider orders.   Patient tolerated infusion well with no complaints voiced.  Patient refused to wait the recommended 30 minute post iron wait time.  Patient left ambulatory in stable condition.  Vital signs stable at discharge.  Follow up as scheduled.

## 2022-06-10 NOTE — Patient Instructions (Signed)
[facility]  Discharge Instructions: Thank you for choosing [facility] to provide your oncology and hematology care.  If you have a lab appointment with the [facility], please come in thru the Main Entrance and check in at the main information desk.  Wear comfortable clothing and clothing appropriate for easy access to any Portacath or PICC line.   We strive to give you quality time with your provider. You may need to reschedule your appointment if you arrive late (15 or more minutes).  Arriving late affects you and other patients whose appointments are after yours.  Also, if you miss three or more appointments without notifying the office, you may be dismissed from the clinic at the provider's discretion.      For prescription refill requests, have your pharmacy contact our office and allow 72 hours for refills to be completed.    Iron Sucrose Injection What is this medication? IRON SUCROSE (EYE ern SOO krose) treats low levels of iron (iron deficiency anemia) in people with kidney disease. Iron is a mineral that plays an important role in making red blood cells, which carry oxygen from your lungs to the rest of your body. This medicine may be used for other purposes; ask your health care provider or pharmacist if you have questions. COMMON BRAND NAME(S): Venofer What should I tell my care team before I take this medication? They need to know if you have any of these conditions: Anemia not caused by low iron levels Heart disease High levels of iron in the blood Kidney disease Liver disease An unusual or allergic reaction to iron, other medications, foods, dyes, or preservatives Pregnant or trying to get pregnant Breastfeeding How should I use this medication? This medication is for infusion into a vein. It is given in a hospital or clinic setting. Talk to your care team about the use of this medication in children. While this medication may be  prescribed for children as young as 2 years for selected conditions, precautions do apply. Overdosage: If you think you have taken too much of this medicine contact a poison control center or emergency room at once. NOTE: This medicine is only for you. Do not share this medicine with others. What if I miss a dose? Keep appointments for follow-up doses. It is important not to miss your dose. Call your care team if you are unable to keep an appointment. What may interact with this medication? Do not take this medication with any of the following: Deferoxamine Dimercaprol Other iron products This medication may also interact with the following: Chloramphenicol Deferasirox This list may not describe all possible interactions. Give your health care provider a list of all the medicines, herbs, non-prescription drugs, or dietary supplements you use. Also tell them if you smoke, drink alcohol, or use illegal drugs. Some items may interact with your medicine. What should I watch for while using this medication? Visit your care team regularly. Tell your care team if your symptoms do not start to get better or if they get worse. You may need blood work done while you are taking this medication. You may need to follow a special diet. Talk to your care team. Foods that contain iron include: whole grains/cereals, dried fruits, beans, or peas, leafy green vegetables, and organ meats (liver, kidney). What side effects may I notice from receiving this medication? Side effects that you should report to your care team as soon as possible: Allergic reactions--skin rash, itching, hives,  swelling of the face, lips, tongue, or throat Low blood pressure--dizziness, feeling faint or lightheaded, blurry vision Shortness of breath Side effects that usually do not require medical attention (report to your care team if they continue or are bothersome): Flushing Headache Joint pain Muscle pain Nausea Pain, redness, or  irritation at injection site This list may not describe all possible side effects. Call your doctor for medical advice about side effects. You may report side effects to FDA at 1-800-FDA-1088. Where should I keep my medication? This medication is given in a hospital or clinic and will not be stored at home. NOTE: This sheet is a summary. It may not cover all possible information. If you have questions about this medicine, talk to your doctor, pharmacist, or health care provider.  2023 Elsevier/Gold Standard (2020-09-18 00:00:00)    To help prevent nausea and vomiting after your treatment, we encourage you to take your nausea medication as directed.  BELOW ARE SYMPTOMS THAT SHOULD BE REPORTED IMMEDIATELY: *FEVER GREATER THAN 100.4 F (38 C) OR HIGHER *CHILLS OR SWEATING *NAUSEA AND VOMITING THAT IS NOT CONTROLLED WITH YOUR NAUSEA MEDICATION *UNUSUAL SHORTNESS OF BREATH *UNUSUAL BRUISING OR BLEEDING *URINARY PROBLEMS (pain or burning when urinating, or frequent urination) *BOWEL PROBLEMS (unusual diarrhea, constipation, pain near the anus) TENDERNESS IN MOUTH AND THROAT WITH OR WITHOUT PRESENCE OF ULCERS (sore throat, sores in mouth, or a toothache) UNUSUAL RASH, SWELLING OR PAIN  UNUSUAL VAGINAL DISCHARGE OR ITCHING   Items with * indicate a potential emergency and should be followed up as soon as possible or go to the Emergency Department if any problems should occur.  Please show the CHEMOTHERAPY ALERT CARD or IMMUNOTHERAPY ALERT CARD at check-in to the Emergency Department and triage nurse.  Should you have questions after your visit or need to cancel or reschedule your appointment, please contact [facility] [phone]  and follow the prompts.  Office hours are 8:00 a.m. to 4:30 p.m. Monday - Friday. Please note that voicemails left after 4:00 p.m. may not be returned until the following business day.  We are closed weekends and major holidays. You have access to  a nurse at all times for urgent questions. Please call the main number to the clinic [phone] and follow the prompts.  For any non-urgent questions, you may also contact your provider using MyChart. We now offer e-Visits for anyone 94 and older to request care online for non-urgent symptoms. For details visit [email or web address].   Also download the MyChart app! Go to the app store, search "MyChart", open the app, select [facility], and log in with your MyChart username and password.  Masks are optional in the cancer centers. If you would like for your care team to wear a mask while they are taking care of you, please let them know. You may have one support person who is at least 72 years old accompany you for your appointments.

## 2022-06-23 ENCOUNTER — Encounter: Payer: Self-pay | Admitting: Podiatry

## 2022-06-23 ENCOUNTER — Ambulatory Visit: Payer: Medicare HMO | Admitting: Podiatry

## 2022-06-23 DIAGNOSIS — B351 Tinea unguium: Secondary | ICD-10-CM

## 2022-06-23 DIAGNOSIS — M79675 Pain in left toe(s): Secondary | ICD-10-CM | POA: Diagnosis not present

## 2022-06-23 DIAGNOSIS — I739 Peripheral vascular disease, unspecified: Secondary | ICD-10-CM | POA: Diagnosis not present

## 2022-06-23 DIAGNOSIS — E119 Type 2 diabetes mellitus without complications: Secondary | ICD-10-CM | POA: Diagnosis not present

## 2022-06-23 DIAGNOSIS — M79674 Pain in right toe(s): Secondary | ICD-10-CM | POA: Diagnosis not present

## 2022-06-23 NOTE — Progress Notes (Signed)
This patient returns to my office for at risk foot care.  This patient requires this care by a professional since this patient will be at risk due to having diabetes and PAD.   Luke KitchenThese nails are painful walking and wearing shoes.  He says he had his calluses treated last month. This patient presents for at risk foot care today.  General Appearance  Alert, conversant and in no acute stress.  Vascular  Dorsalis pedis and posterior tibial  pulses are palpable  bilaterally.  Capillary return is within normal limits  bilaterally. Temperature is within normal limits  bilaterally.  Neurologic  Senn-Weinstein monofilament wire test diminished bilaterally. Muscle power within normal limits bilaterally.  Nails Thick disfigured discolored nails with subungual debris  from hallux to fifth toes bilaterally. No evidence of bacterial infection or drainage bilaterally.  Orthopedic  No limitations of motion  feet .  No crepitus or effusions noted.  No bony pathology or digital deformities noted. DJD 1st  MPJ  B/L.  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    Skin  normotropic skin with no porokeratosis noted bilaterally.  No signs of infections or ulcers noted.   Corn lateral aspect fourth toe right foot.  Asymptomatic porokeratosis sub 4th left  Onychomycosis  Pain in right toes  Pain in left toes   Consent was obtained for treatment procedures.   Mechanical debridement of nails 1-5  bilaterally performed with a nail nipper.  Filed with dremel without incident.     Return office visit   10 weeks                  Told patient to return for periodic foot care and evaluation due to potential at risk complications.   Gardiner Barefoot DPM

## 2022-07-14 DIAGNOSIS — D509 Iron deficiency anemia, unspecified: Secondary | ICD-10-CM | POA: Diagnosis not present

## 2022-07-22 ENCOUNTER — Ambulatory Visit (HOSPITAL_BASED_OUTPATIENT_CLINIC_OR_DEPARTMENT_OTHER)
Admission: RE | Admit: 2022-07-22 | Discharge: 2022-07-22 | Disposition: A | Discharge status: Home / Self Care | Payer: Medicare HMO | Source: Ambulatory Visit | Attending: Cardiovascular Disease | Admitting: Cardiovascular Disease

## 2022-07-22 ENCOUNTER — Ambulatory Visit (HOSPITAL_COMMUNITY)
Admission: RE | Admit: 2022-07-22 | Discharge: 2022-07-22 | Disposition: A | Discharge status: Home / Self Care | Payer: Medicare HMO | Source: Ambulatory Visit | Attending: Internal Medicine | Admitting: Internal Medicine

## 2022-07-22 DIAGNOSIS — Z95828 Presence of other vascular implants and grafts: Secondary | ICD-10-CM | POA: Diagnosis not present

## 2022-07-22 DIAGNOSIS — I739 Peripheral vascular disease, unspecified: Secondary | ICD-10-CM | POA: Diagnosis not present

## 2022-07-25 LAB — VAS US ABI WITH/WO TBI
Left ABI: 0.68
Right ABI: 0.88

## 2022-07-26 ENCOUNTER — Other Ambulatory Visit: Payer: Self-pay

## 2022-07-26 DIAGNOSIS — I739 Peripheral vascular disease, unspecified: Secondary | ICD-10-CM

## 2022-07-26 DIAGNOSIS — Z95828 Presence of other vascular implants and grafts: Secondary | ICD-10-CM

## 2022-08-04 DIAGNOSIS — D509 Iron deficiency anemia, unspecified: Secondary | ICD-10-CM | POA: Diagnosis not present

## 2022-08-10 DIAGNOSIS — E1151 Type 2 diabetes mellitus with diabetic peripheral angiopathy without gangrene: Secondary | ICD-10-CM | POA: Diagnosis not present

## 2022-08-10 DIAGNOSIS — F630 Pathological gambling: Secondary | ICD-10-CM | POA: Diagnosis not present

## 2022-08-10 DIAGNOSIS — G4710 Hypersomnia, unspecified: Secondary | ICD-10-CM | POA: Diagnosis not present

## 2022-08-10 DIAGNOSIS — Z0000 Encounter for general adult medical examination without abnormal findings: Secondary | ICD-10-CM | POA: Diagnosis not present

## 2022-08-10 DIAGNOSIS — Z0001 Encounter for general adult medical examination with abnormal findings: Secondary | ICD-10-CM | POA: Diagnosis not present

## 2022-08-10 DIAGNOSIS — I10 Essential (primary) hypertension: Secondary | ICD-10-CM | POA: Diagnosis not present

## 2022-08-10 DIAGNOSIS — E1169 Type 2 diabetes mellitus with other specified complication: Secondary | ICD-10-CM | POA: Diagnosis not present

## 2022-08-10 DIAGNOSIS — N400 Enlarged prostate without lower urinary tract symptoms: Secondary | ICD-10-CM | POA: Diagnosis not present

## 2022-08-10 DIAGNOSIS — D509 Iron deficiency anemia, unspecified: Secondary | ICD-10-CM | POA: Diagnosis not present

## 2022-08-25 DIAGNOSIS — N401 Enlarged prostate with lower urinary tract symptoms: Secondary | ICD-10-CM | POA: Diagnosis not present

## 2022-09-01 DIAGNOSIS — R3912 Poor urinary stream: Secondary | ICD-10-CM | POA: Diagnosis not present

## 2022-09-01 DIAGNOSIS — N401 Enlarged prostate with lower urinary tract symptoms: Secondary | ICD-10-CM | POA: Diagnosis not present

## 2022-09-22 ENCOUNTER — Ambulatory Visit: Payer: Medicare HMO | Admitting: Podiatry

## 2022-09-29 ENCOUNTER — Ambulatory Visit: Payer: Medicare HMO | Admitting: Podiatry

## 2022-09-29 DIAGNOSIS — B351 Tinea unguium: Secondary | ICD-10-CM | POA: Diagnosis not present

## 2022-09-29 DIAGNOSIS — E1149 Type 2 diabetes mellitus with other diabetic neurological complication: Secondary | ICD-10-CM | POA: Diagnosis not present

## 2022-09-29 DIAGNOSIS — M79674 Pain in right toe(s): Secondary | ICD-10-CM | POA: Diagnosis not present

## 2022-09-29 DIAGNOSIS — M79675 Pain in left toe(s): Secondary | ICD-10-CM

## 2022-09-29 DIAGNOSIS — E1140 Type 2 diabetes mellitus with diabetic neuropathy, unspecified: Secondary | ICD-10-CM

## 2022-09-29 DIAGNOSIS — Q828 Other specified congenital malformations of skin: Secondary | ICD-10-CM

## 2022-09-29 NOTE — Progress Notes (Signed)
Subjective:   Patient ID: Luke [name], male   DOB: 72 y.o.   MRN: [id number]   HPI Long-term diabetic moderate neuropathic changes with 3 separate distinct lesions plantar aspect left foot painful and thick yellow brittle nailbeds 1-5 both feet that he cannot take care of and are painful   ROS      Objective:  Physical Exam  Vascular status intact neurologically diminished both sharp dull vibratory with patient found to have keratotic lesion with lucent cores x 3 plantar left and elongated thickened nailbeds 1-5 both feet     Assessment:  Neuropathy diabetic along with porokeratotic lesions and painful mycotic nail infections 1-5 both feet     Plan:  H&P reviewed debrided out the lesions as he is at high risk no angiogenic bleeding debrided nailbeds 1-5 both feet patient to be seen back to recheck all questions answered

## 2022-11-10 ENCOUNTER — Other Ambulatory Visit: Payer: Self-pay

## 2022-11-10 DIAGNOSIS — D500 Iron deficiency anemia secondary to blood loss (chronic): Secondary | ICD-10-CM

## 2022-11-10 DIAGNOSIS — D509 Iron deficiency anemia, unspecified: Secondary | ICD-10-CM

## 2022-11-10 NOTE — Addendum Note (Signed)
Addended by: Lanier Prude. on: 11/10/2022 09:56 AM   Modules accepted: Orders

## 2022-11-12 ENCOUNTER — Inpatient Hospital Stay: Payer: Medicare HMO

## 2022-11-12 DIAGNOSIS — E538 Deficiency of other specified B group vitamins: Secondary | ICD-10-CM | POA: Diagnosis not present

## 2022-11-12 DIAGNOSIS — E1169 Type 2 diabetes mellitus with other specified complication: Secondary | ICD-10-CM | POA: Diagnosis not present

## 2022-11-18 DIAGNOSIS — E1151 Type 2 diabetes mellitus with diabetic peripheral angiopathy without gangrene: Secondary | ICD-10-CM | POA: Diagnosis not present

## 2022-11-18 DIAGNOSIS — D509 Iron deficiency anemia, unspecified: Secondary | ICD-10-CM | POA: Diagnosis not present

## 2022-11-18 DIAGNOSIS — N400 Enlarged prostate without lower urinary tract symptoms: Secondary | ICD-10-CM | POA: Diagnosis not present

## 2022-11-18 DIAGNOSIS — G4710 Hypersomnia, unspecified: Secondary | ICD-10-CM | POA: Diagnosis not present

## 2022-11-18 DIAGNOSIS — E782 Mixed hyperlipidemia: Secondary | ICD-10-CM | POA: Diagnosis not present

## 2022-11-18 DIAGNOSIS — I10 Essential (primary) hypertension: Secondary | ICD-10-CM | POA: Diagnosis not present

## 2022-11-18 DIAGNOSIS — E1169 Type 2 diabetes mellitus with other specified complication: Secondary | ICD-10-CM | POA: Diagnosis not present

## 2022-11-18 DIAGNOSIS — I739 Peripheral vascular disease, unspecified: Secondary | ICD-10-CM | POA: Diagnosis not present

## 2022-11-18 DIAGNOSIS — F630 Pathological gambling: Secondary | ICD-10-CM | POA: Diagnosis not present

## 2022-11-19 ENCOUNTER — Inpatient Hospital Stay: Payer: Medicare HMO | Attending: Physician Assistant | Admitting: Physician Assistant

## 2022-11-19 DIAGNOSIS — D500 Iron deficiency anemia secondary to blood loss (chronic): Secondary | ICD-10-CM

## 2022-11-19 DIAGNOSIS — Z87891 Personal history of nicotine dependence: Secondary | ICD-10-CM

## 2022-11-19 DIAGNOSIS — Z122 Encounter for screening for malignant neoplasm of respiratory organs: Secondary | ICD-10-CM

## 2022-11-19 NOTE — Progress Notes (Signed)
VIRTUAL VISIT via TELEPHONE NOTE [facility]   I connected with Luke [name]  on 11/19/22 at 12:58 PM by telephone and verified that I am speaking with the correct person using two identifiers.  Location: Patient: Home Provider: [facility]   I discussed the limitations, risks, security and privacy concerns of performing an evaluation and management service by telephone and the availability of in person appointments. I also discussed with the patient that there may be a patient responsible charge related to this service. The patient expressed understanding and agreed to proceed.  REASON FOR VISIT:  Follow-up for iron deficiency anemia   PRIOR THERAPY: Oral iron   CURRENT THERAPY: Intermittent PRBCs transfusions & IV iron infusions  INTERVAL HISTORY:  [name] is contacted today for follow-up of  iron deficiency anemia.  He was last seen by Luke Brenner PA-C on 05/18/2022.  Most recent IV iron with Venofer 300 mg x 2 on 06/03/2022 and 06/10/2022.   At today's visit, he reports continuing to feeling fairly well, but does note some significant fatigue and recurrent restless leg symptoms.  He does not have any pica.  He previously was having black stools that were present even before starting iron pills; now taking iron pill daily, but denies any black bowel movements at this time.  No bright red blood per rectum, melena, or epistaxis since his last visit.  He denies any chest pain, dyspnea on exertion, lightheadedness, or syncope.   He has little to no energy and 50% appetite. He endorses that he is maintaining a stable weight.  REVIEW OF SYSTEMS:   Review of Systems  Constitutional:  Positive for malaise/fatigue. Negative for chills, diaphoresis, fever and weight loss.  Respiratory:  Negative for cough and shortness of breath.   Cardiovascular:  Negative for chest pain and palpitations.  Gastrointestinal:  Positive for diarrhea. Negative for  abdominal pain, blood in stool, melena, nausea and vomiting.  Neurological:  Positive for dizziness. Negative for headaches.  Psychiatric/Behavioral:  The patient has insomnia.     PHYSICAL EXAM: (per limitations of virtual telephone visit)  The patient is alert and oriented x 3, exhibiting adequate mentation, good mood, and ability to speak in full sentences and execute sound judgement.  ASSESSMENT & PLAN:  1.  Severe microcytic anemia / iron deficiency anemia - Hematology work-up (09/30/2021) significant for severe iron deficiency anemia.  Additional labs showed normal creatinine, copper, B12, methylmalonic acid, folate.  Normal SPEP and LDH.  Low reticulocytes for degree of anemia that was present. 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  Also required blood transfusion 7 years ago. - Last iron with IV Venofer 300 mg x 2 in December 2023 - He has some recurrent restless legs and fatigue.  No pica. - He reports having intermittent black stools.  Denies any bleeding per rectum. - Most recent labs (11/12/2022) Hgb 14.1/MCV 84.  Ferritin 120, iron saturation 8% with normal TIBC 285. - Most likely iron deficiency anemia from chronic blood loss. - Follows with Dr. Elnoria Howard of [facility].  Reportedly had EGD/colonoscopy on 10/14/2021, which were normal per patient report. - PLAN: He has low iron saturation, but with normal ferritin and no anemia.  However, he is symptomatic with severe fatigue.  Recommend IV Venofer 400 mg x 1.   -  Continue oral iron supplement. - Labs in 6 months with phone visit 1 week after   2.  Smoking history: - Patient has significant smoking history. - LDCT chest (11/13/2021): Lung RADS category 1S, negative for any signs of lung cancer, both signs of aortic atherosclerosis and left main and  three-vessel CAD and findings suggestive of underlying COPD - PLAN: Continue annual LDCT chest as part of LCS program - next due May 2024   3.  Social/family history: - Lives at home with his wife.  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  Smoked 2 packs/day for 50 years, quit in approximately December 26, 2018. - Brother died of lung cancer.  Patient grew up in foster home and does not know much of family history.  PLAN SUMMARY: >> Venofer 400 mg x 1 >> Due for LDCT chest  >> Labs in 6 months (CBC/D, ferritin, iron/TIBC) >> OFFICE visit 1 week after labs     I discussed the assessment and treatment plan with the patient. The patient was provided an opportunity to ask questions and all were answered. The patient agreed with the plan and demonstrated an understanding of the instructions.   The patient was advised to call back or seek an in-person evaluation if the symptoms worsen or if the condition fails to improve as anticipated.  I provided 22 minutes of non-face-to-face time during this encounter.  Carnella Guadalajara, PA-C 11/19/2022 1:20 PM

## 2022-11-19 NOTE — Addendum Note (Signed)
Addended by: Rojelio Brenner on: 11/19/2022 01:29 PM   Modules accepted: Orders

## 2022-11-26 ENCOUNTER — Encounter: Payer: Self-pay | Admitting: Cardiovascular Disease

## 2022-11-26 ENCOUNTER — Ambulatory Visit: Payer: Medicare HMO | Attending: Cardiovascular Disease | Admitting: Cardiovascular Disease

## 2022-11-26 VITALS — BP 132/56 | HR 83 | Ht 66.0 in | Wt 168.4 lb

## 2022-11-26 DIAGNOSIS — I2583 Coronary atherosclerosis due to lipid rich plaque: Secondary | ICD-10-CM

## 2022-11-26 DIAGNOSIS — E782 Mixed hyperlipidemia: Secondary | ICD-10-CM

## 2022-11-26 DIAGNOSIS — I739 Peripheral vascular disease, unspecified: Secondary | ICD-10-CM

## 2022-11-26 DIAGNOSIS — I2510 Atherosclerotic heart disease of native coronary artery without angina pectoris: Secondary | ICD-10-CM

## 2022-11-26 DIAGNOSIS — I10 Essential (primary) hypertension: Secondary | ICD-10-CM | POA: Diagnosis not present

## 2022-11-26 DIAGNOSIS — R0989 Other specified symptoms and signs involving the circulatory and respiratory systems: Secondary | ICD-10-CM

## 2022-11-26 DIAGNOSIS — I779 Disorder of arteries and arterioles, unspecified: Secondary | ICD-10-CM | POA: Insufficient documentation

## 2022-11-26 DIAGNOSIS — I6523 Occlusion and stenosis of bilateral carotid arteries: Secondary | ICD-10-CM

## 2022-11-26 MED ORDER — RAMIPRIL 10 MG PO CAPS: 10.0000 mg | ORAL_CAPSULE | Freq: Two times a day (BID) | ORAL | 3 refills | Status: DC

## 2022-11-26 NOTE — Progress Notes (Signed)
11/26/2022 Luke [name]   27-Feb-1951  [id number]  Primary Physician Benita Stabile, MD Primary Cardiologist: Runell Gess MD FACP, [place], [place], [place]  HPI:  Luke [name] is a 72 y.o.   thin appearing married Caucasian male father of 2, grandfather of 4 grandchildren who I last saw in the office 11/25/2021.  He is accompanied by his wife Luke [name] today.Marland Kitchen Apparently I took care of her father 15 years ago, Luke [name].  He was referred by Metta Clines, NP for peripheral vascular evaluation because of lifestyle limiting claudication.  I last saw him in the office 01/29/2020.  His risk factors include greater than 100 pack years of tobacco abuse having quit for year ago, treated hypertension, diabetes and hyperlipidemia.  He is never had a heart attack or stroke.  He denies chest pain or shortness of breath.  He does have documented coronary disease by Dr. Tresa Endo years ago but was never intervened on.  He is complained of lower extremity claudication for the last 4 years which has gone undiagnosed and treated as a "orthopedic issue".  Recent Dopplers performed in our office 12/12/2017 revealed a right ABI of 0.90 and left 0.51 with high-frequency signals in both iliac arteries.  I suspect this left common iliac is either occluded or or subtotally occluded.  He wishes to proceed with angiography and intervention.   I performed peripheral angiography on him 12/19/2017 revealing a 91% calcified ostial left common iliac artery stenosis and a 50% right.  I performed Dynabac orbital rotational atherectomy, PTA and covered stenting on him with an excellent angiographic and clinical result.  Follow-up Dopplers performed subsequent to that revealed increase in his left ABI from 0.51 up to 0.85.  His claudication has completely resolved.   Since I saw him in the office a year ago he continues to do well.  He does continue to have low iron and require iron infusions for unclear reasons followed by his PCP.  He denies chest  pain or shortness of breath.  Chest CT in the past has shown three-vessel coronary calcification.  He denies claudication now 5 years post left common iliac artery intervention with Dopplers performed 07/22/2022 revealing a patent left iliac stent with moderate right iliac disease.     Current Meds  Medication Sig   amLODipine (NORVASC) 10 MG tablet TAKE 1 TABLET EVERY DAY   aspirin 81 MG tablet Take 1 tablet (81 mg total) by mouth daily. HOLD FOR 1 WEEK, THEN RESUME IF NO BLEEDING PROBLEMS   atorvastatin (LIPITOR) 80 MG tablet Take 1 tablet (80 mg total) by mouth daily.   bisoprolol-hydrochlorothiazide (ZIAC) 2.5-6.25 MG tablet    clobetasol (TEMOVATE) 0.05 % external solution Apply 1 application. topically at bedtime.   empagliflozin (JARDIANCE) 25 MG TABS tablet Take 25 mg by mouth daily.    etodolac (LODINE XL) 400 MG 24 hr tablet    FARXIGA 10 MG TABS tablet Take 1 tablet every day by oral route.   Ferrous Sulfate Dried (HIGH POTENCY IRON) 65 MG TABS    fexofenadine (ALLEGRA) 180 MG tablet Take 1 tablet every day by oral route.   finasteride (PROSCAR) 5 MG tablet Take 5 mg by mouth daily.   Insulin Degludec-Liraglutide (XULTOPHY) 100-3.6 UNIT-MG/ML SOPN Inject 24 Units into the skin daily.    INVOKANA 300 MG TABS tablet    levocetirizine (XYZAL) 5 MG tablet levocetirizine 5 mg tablet   Loratadine 10 MG CAPS Claritin   metFORMIN (GLUCOPHAGE) 500 MG  tablet Take 1,000 mg by mouth 2 (two) times daily.   Multiple Vitamins-Minerals (CENTRUM SILVER ULTRA MENS PO) Centrum Silver Ultra Men's   ONGLYZA 5 MG TABS tablet    pantoprazole (PROTONIX) 40 MG tablet TAKE 1 TABLET EVERY DAY   rOPINIRole (REQUIP) 0.25 MG tablet    tamsulosin (FLOMAX) 0.4 MG CAPS capsule Take 0.4 mg by mouth.   vitamin B-12 (CYANOCOBALAMIN) 100 MCG tablet Vitamin B-12   [DISCONTINUED] metoprolol succinate (TOPROL-XL) 25 MG 24 hr tablet Take 25 mg by mouth daily.   [DISCONTINUED] ramipril (ALTACE) 10 MG capsule TAKE 1  CAPSULE EVERY DAY     Allergies  Allergen Reactions   Morphine And Codeine Itching    Social History   Socioeconomic History   Marital status: Married    Spouse name: Not on file   Number of children: Not on file   Years of education: Not on file   Highest education level: Not on file  Occupational History   Not on file  Tobacco Use   Smoking status: Former    Packs/day: 2.00    Years: 50.00    Additional pack years: 0.00    Total pack years: 100.00    Types: Cigarettes    Quit date: 09/28/2014    Years since quitting: 8.1   Smokeless tobacco: Never  Vaping Use   Vaping Use: Never used  Substance and Sexual Activity   Alcohol use: Never    Alcohol/week: 0.0 standard drinks of alcohol   Drug use: Never   Sexual activity: Not Currently  Other Topics Concern   Not on file  Social History Narrative   Not on file   Social Determinants of Health   Financial Resource Strain: Not on file  Food Insecurity: Not on file  Transportation Needs: Not on file  Physical Activity: Not on file  Stress: Not on file  Social Connections: Not on file  Intimate Partner Violence: Not on file     Review of Systems: General: negative for chills, fever, night sweats or weight changes.  Cardiovascular: negative for chest pain, dyspnea on exertion, edema, orthopnea, palpitations, paroxysmal nocturnal dyspnea or shortness of breath Dermatological: negative for rash Respiratory: negative for cough or wheezing Urologic: negative for hematuria Abdominal: negative for nausea, vomiting, diarrhea, bright red blood per rectum, melena, or hematemesis Neurologic: negative for visual changes, syncope, or dizziness All other systems reviewed and are otherwise negative except as noted above.    Blood pressure (!) 132/56, pulse 83, height 5\' 6"  (1.676 m), weight 168 lb 6.4 oz (76.4 kg), SpO2 97 %.  General appearance: alert and no distress Neck: no adenopathy, no JVD, supple, symmetrical, trachea  midline, thyroid not enlarged, symmetric, no tenderness/mass/nodules, and bilateral carotid bruits Lungs: clear to auscultation bilaterally Heart: regular rate and rhythm, S1, S2 normal, no murmur, click, rub or gallop Extremities: extremities normal, atraumatic, no cyanosis or edema Pulses: 2+ and symmetric Skin: Skin color, texture, turgor normal. No rashes or lesions Neurologic: Grossly normal  EKG sinus rhythm 83 without ST or T wave changes.  There were occasional PACs noted with sinus arrhythmia.  I personally reviewed this EKG.  ASSESSMENT AND PLAN:   HTN (hypertension) History of essential hypertension blood pressure measured today at 132/56.  He is on amlodipine, Ziac, and lisinopril which was recently increased to twice daily because of blood high blood pressure which is under better control.    Hyperlipemia History of hyperlipidemia on high-dose atorvastatin with lipid profile performed 11/12/2022 revealing  total cholesterol 107, LDL 53 and HDL 32.  CAD (coronary artery disease) History of three-vessel coronary disease with nonobstructive CAD demonstrate by Dr. Tresa Endo years ago never intervened on.  He does have three-vessel calcification on chest CT.  He denies chest pain or shortness of breath.  Peripheral arterial disease (HCC) History of PAD status post orbital atherectomy, VBX stenting of a 99% calcified ostial left common iliac artery stenosis by myself 12/19/2017.  He did have a 50% right common iliac artery stenosis with a 15 to 20 mm Tran stenotic gradient although he was asymptomatic from this.  His most recent Dopplers performed 07/22/2022 revealed his left iliac stent to be widely patent with moderate disease in his right common and external iliac artery.  Will continue to follow this on annual basis.  Carotid artery disease (HCC) Moderate bilateral ICA stenosis by duplex ultrasound 02/02/2022, right greater than left.  He does have bilateral carotid bruits.  Will continue to  follow this noninvasively on annual basis.     Runell Gess MD FACP,FACC,FAHA, [facility] 11/26/2022 8:47 AM

## 2022-11-26 NOTE — Assessment & Plan Note (Signed)
History of hyperlipidemia on high-dose atorvastatin with lipid profile performed 11/12/2022 revealing total cholesterol 107, LDL 53 and HDL 32.

## 2022-11-26 NOTE — Patient Instructions (Signed)
Medication Instructions:  Your physician recommends that you continue on your current medications as directed. Please refer to the Current Medication list given to you today.  *If you need a refill on your cardiac medications before your next appointment, please call your pharmacy*   Testing/Procedures: Your physician has requested that you have an Aorta/Iliac Duplex. This will be take place at [address] [facility] [address].  No food after 11PM the night before.  Water is OK. (Don't drink liquids if you have been instructed not to for ANOTHER test) Avoid foods that produce bowel gas, for 24 hours prior to exam (see below). No breakfast, no chewing gum, no smoking or carbonated beverages. Patient may take morning medications with water. Come in for test at least 15 minutes early to register. To do in February 2025.  Your physician has requested that you have an ankle brachial index (ABI). During this test an ultrasound and blood pressure cuff are used to evaluate the arteries that supply the arms and legs with blood. Allow thirty minutes for this exam. There are no restrictions or special instructions. This will take place at [address] [facility], [address]. To do in February 2025.  Your physician has requested that you have a carotid duplex. This test is an ultrasound of the carotid arteries in your neck. It looks at blood flow through these arteries that supply the brain with blood. Allow one hour for this exam. There are no restrictions or special instructions. This will take place at [address] [facility], [address]. To do in February 2025.    Follow-Up: At [facility], you and your health needs are our priority.  As part of our continuing mission to provide you with exceptional heart care, we have created designated Provider Care Teams.  These Care Teams include your primary Cardiologist (physician) and Advanced Practice Providers (APPs -  Physician Assistants and Nurse Practitioners)  who all work together to provide you with the care you need, when you need it.  We recommend signing up for the patient portal called "MyChart".  Sign up information is provided on this After Visit Summary.  MyChart is used to connect with patients for Virtual Visits (Telemedicine).  Patients are able to view lab/test results, encounter notes, upcoming appointments, etc.  Non-urgent messages can be sent to your provider as well.   To learn more about what you can do with MyChart, go to [email or web address].    Your next appointment:   12 month(s)  Provider:   Nanetta Batty, MD

## 2022-11-26 NOTE — Assessment & Plan Note (Signed)
History of three-vessel coronary disease with nonobstructive CAD demonstrate by Dr. Tresa Endo years ago never intervened on.  He does have three-vessel calcification on chest CT.  He denies chest pain or shortness of breath.

## 2022-11-26 NOTE — Assessment & Plan Note (Addendum)
History of essential hypertension blood pressure measured today at 132/56.  He is on amlodipine, Ziac, and lisinopril which was recently increased to twice daily because of blood high blood pressure which is under better control.

## 2022-11-26 NOTE — Assessment & Plan Note (Signed)
History of PAD status post orbital atherectomy, VBX stenting of a 99% calcified ostial left common iliac artery stenosis by myself 12/19/2017.  He did have a 50% right common iliac artery stenosis with a 15 to 20 mm Tran stenotic gradient although he was asymptomatic from this.  His most recent Dopplers performed 07/22/2022 revealed his left iliac stent to be widely patent with moderate disease in his right common and external iliac artery.  Will continue to follow this on annual basis.

## 2022-11-26 NOTE — Addendum Note (Signed)
Addended by: Bernita Buffy on: 11/26/2022 08:55 AM   Modules accepted: Orders

## 2022-11-26 NOTE — Assessment & Plan Note (Signed)
Moderate bilateral ICA stenosis by duplex ultrasound 02/02/2022, right greater than left.  He does have bilateral carotid bruits.  Will continue to follow this noninvasively on annual basis.

## 2022-12-03 ENCOUNTER — Inpatient Hospital Stay: Payer: Medicare HMO | Attending: Physician Assistant

## 2022-12-03 VITALS — BP 166/67 | HR 59 | Temp 97.7°F | Resp 18

## 2022-12-03 DIAGNOSIS — Z79899 Other long term (current) drug therapy: Secondary | ICD-10-CM | POA: Insufficient documentation

## 2022-12-03 DIAGNOSIS — D509 Iron deficiency anemia, unspecified: Secondary | ICD-10-CM | POA: Insufficient documentation

## 2022-12-03 MED ORDER — SODIUM CHLORIDE 0.9 % IV SOLN
400.0000 mg | Freq: Once | INTRAVENOUS | Status: AC
  Administered 2022-12-03: 400 mg via INTRAVENOUS
  Filled 2022-12-03: qty 400

## 2022-12-03 MED ORDER — SODIUM CHLORIDE 0.9 % IV SOLN: Freq: Once | INTRAVENOUS | Status: AC

## 2022-12-03 NOTE — Progress Notes (Signed)
Patient presents today for iron infusion.  Patient is in satisfactory condition with no new complaints voiced.  Vital signs are stable. Patient reports taking tylenol and zrytec at 1030 this morning prior to arrival.  We will proceed with infusion per provider orders.    Patient tolerated treatment well with no complaints voiced.  Patient refuses to wait 30 minute wait time. Patient made aware of possible complications related to leaving prior to wait time, verbalized understanding. Patient left ambulatory in stable condition.  Vital signs stable at discharge.  Follow up as scheduled.

## 2022-12-03 NOTE — Patient Instructions (Signed)
[facility]  Discharge Instructions: Thank you for choosing [facility] to provide your oncology and hematology care.  If you have a lab appointment with the Cancer Center - please note that after April 8th, 2024, all labs will be drawn in the cancer center.  You do not have to check in or register with the main entrance as you have in the past but will complete your check-in in the cancer center.  Wear comfortable clothing and clothing appropriate for easy access to any Portacath or PICC line.   We strive to give you quality time with your provider. You may need to reschedule your appointment if you arrive late (15 or more minutes).  Arriving late affects you and other patients whose appointments are after yours.  Also, if you miss three or more appointments without notifying the office, you may be dismissed from the clinic at the provider's discretion.      For prescription refill requests, have your pharmacy contact our office and allow 72 hours for refills to be completed.    Today you received the following Venofer.  Iron Sucrose Injection What is this medication? IRON SUCROSE (EYE ern SOO krose) treats low levels of iron (iron deficiency anemia) in people with kidney disease. Iron is a mineral that plays an important role in making red blood cells, which carry oxygen from your lungs to the rest of your body. This medicine may be used for other purposes; ask your health care provider or pharmacist if you have questions. COMMON BRAND NAME(S): Venofer What should I tell my care team before I take this medication? They need to know if you have any of these conditions: Anemia not caused by low iron levels Heart disease High levels of iron in the blood Kidney disease Liver disease An unusual or allergic reaction to iron, other medications, foods, dyes, or preservatives Pregnant or trying to get pregnant Breastfeeding How should I use this  medication? This medication is for infusion into a vein. It is given in a hospital or clinic setting. Talk to your care team about the use of this medication in children. While this medication may be prescribed for children as young as 2 years for selected conditions, precautions do apply. Overdosage: If you think you have taken too much of this medicine contact a poison control center or emergency room at once. NOTE: This medicine is only for you. Do not share this medicine with others. What if I miss a dose? Keep appointments for follow-up doses. It is important not to miss your dose. Call your care team if you are unable to keep an appointment. What may interact with this medication? Do not take this medication with any of the following: Deferoxamine Dimercaprol Other iron products This medication may also interact with the following: Chloramphenicol Deferasirox This list may not describe all possible interactions. Give your health care provider a list of all the medicines, herbs, non-prescription drugs, or dietary supplements you use. Also tell them if you smoke, drink alcohol, or use illegal drugs. Some items may interact with your medicine. What should I watch for while using this medication? Visit your care team regularly. Tell your care team if your symptoms do not start to get better or if they get worse. You may need blood work done while you are taking this medication. You may need to follow a special diet. Talk to your care team. Foods that contain iron include: whole grains/cereals, dried fruits, beans, or   peas, leafy green vegetables, and organ meats (liver, kidney). What side effects may I notice from receiving this medication? Side effects that you should report to your care team as soon as possible: Allergic reactions--skin rash, itching, hives, swelling of the face, lips, tongue, or throat Low blood pressure--dizziness, feeling faint or lightheaded, blurry vision Shortness of  breath Side effects that usually do not require medical attention (report to your care team if they continue or are bothersome): Flushing Headache Joint pain Muscle pain Nausea Pain, redness, or irritation at injection site This list may not describe all possible side effects. Call your doctor for medical advice about side effects. You may report side effects to FDA at 1-800-FDA-1088. Where should I keep my medication? This medication is given in a hospital or clinic and will not be stored at home. NOTE: This sheet is a summary. It may not cover all possible information. If you have questions about this medicine, talk to your doctor, pharmacist, or health care provider.  2024 Elsevier/Gold Standard (2021-12-16 00:00:00)      To help prevent nausea and vomiting after your treatment, we encourage you to take your nausea medication as directed.  BELOW ARE SYMPTOMS THAT SHOULD BE REPORTED IMMEDIATELY: *FEVER GREATER THAN 100.4 F (38 C) OR HIGHER *CHILLS OR SWEATING *NAUSEA AND VOMITING THAT IS NOT CONTROLLED WITH YOUR NAUSEA MEDICATION *UNUSUAL SHORTNESS OF BREATH *UNUSUAL BRUISING OR BLEEDING *URINARY PROBLEMS (pain or burning when urinating, or frequent urination) *BOWEL PROBLEMS (unusual diarrhea, constipation, pain near the anus) TENDERNESS IN MOUTH AND THROAT WITH OR WITHOUT PRESENCE OF ULCERS (sore throat, sores in mouth, or a toothache) UNUSUAL RASH, SWELLING OR PAIN  UNUSUAL VAGINAL DISCHARGE OR ITCHING   Items with * indicate a potential emergency and should be followed up as soon as possible or go to the Emergency Department if any problems should occur.  Please show the CHEMOTHERAPY ALERT CARD or IMMUNOTHERAPY ALERT CARD at check-in to the Emergency Department and triage nurse.  Should you have questions after your visit or need to cancel or reschedule your appointment, please contact [facility] [phone]  and follow the prompts.  Office hours are  8:00 a.m. to 4:30 p.m. Monday - Friday. Please note that voicemails left after 4:00 p.m. may not be returned until the following business day.  We are closed weekends and major holidays. You have access to a nurse at all times for urgent questions. Please call the main number to the clinic [phone] and follow the prompts.  For any non-urgent questions, you may also contact your provider using MyChart. We now offer e-Visits for anyone 18 and older to request care online for non-urgent symptoms. For details visit [email or web address].   Also download the MyChart app! Go to the app store, search "MyChart", open the app, select [place], and log in with your MyChart username and password.   

## 2022-12-08 ENCOUNTER — Encounter: Payer: Self-pay | Admitting: Podiatry

## 2022-12-08 ENCOUNTER — Ambulatory Visit (INDEPENDENT_AMBULATORY_CARE_PROVIDER_SITE_OTHER): Payer: Medicare HMO | Admitting: Podiatry

## 2022-12-08 DIAGNOSIS — Q828 Other specified congenital malformations of skin: Secondary | ICD-10-CM | POA: Diagnosis not present

## 2022-12-08 DIAGNOSIS — E1149 Type 2 diabetes mellitus with other diabetic neurological complication: Secondary | ICD-10-CM

## 2022-12-08 DIAGNOSIS — E1140 Type 2 diabetes mellitus with diabetic neuropathy, unspecified: Secondary | ICD-10-CM

## 2022-12-08 NOTE — Progress Notes (Signed)
Subjective:   Patient ID: Luke [name], male   DOB: 72 y.o.   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

## 2022-12-09 DIAGNOSIS — H903 Sensorineural hearing loss, bilateral: Secondary | ICD-10-CM | POA: Diagnosis not present

## 2022-12-14 ENCOUNTER — Encounter: Payer: Self-pay | Admitting: Physician Assistant

## 2022-12-17 ENCOUNTER — Ambulatory Visit: Payer: Medicare HMO | Admitting: Podiatry

## 2022-12-17 ENCOUNTER — Encounter: Payer: Self-pay | Admitting: Podiatry

## 2022-12-17 DIAGNOSIS — M79674 Pain in right toe(s): Secondary | ICD-10-CM | POA: Diagnosis not present

## 2022-12-17 DIAGNOSIS — E1140 Type 2 diabetes mellitus with diabetic neuropathy, unspecified: Secondary | ICD-10-CM

## 2022-12-17 DIAGNOSIS — E1149 Type 2 diabetes mellitus with other diabetic neurological complication: Secondary | ICD-10-CM | POA: Diagnosis not present

## 2022-12-17 DIAGNOSIS — B351 Tinea unguium: Secondary | ICD-10-CM

## 2022-12-17 DIAGNOSIS — M79675 Pain in left toe(s): Secondary | ICD-10-CM

## 2022-12-17 NOTE — Progress Notes (Signed)
This patient returns to my office for at risk foot care.  This patient requires this care by a professional since this patient will be at risk due to having diabetes and PAD.   .These nails are painful walking and wearing shoes.  He says he had his calluses treated last month. This patient presents for at risk foot care today.  General Appearance  Alert, conversant and in no acute stress.  Vascular  Dorsalis pedis and posterior tibial  pulses are palpable  bilaterally.  Capillary return is within normal limits  bilaterally. Temperature is within normal limits  bilaterally.  Neurologic  Senn-Weinstein monofilament wire test diminished bilaterally. Muscle power within normal limits bilaterally.  Nails Thick disfigured discolored nails with subungual debris  from hallux to fifth toes bilaterally. No evidence of bacterial infection or drainage bilaterally.  Orthopedic  No limitations of motion  feet .  No crepitus or effusions noted.  No bony pathology or digital deformities noted. DJD 1st  MPJ  B/L.  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    Skin  normotropic skin with no porokeratosis noted bilaterally.  No signs of infections or ulcers noted.   Corn lateral aspect fourth toe right foot.  Asymptomatic porokeratosis sub 4th left  Onychomycosis  Pain in right toes  Pain in left toes   Consent was obtained for treatment procedures.   Mechanical debridement of nails 1-5  bilaterally performed with a nail nipper.  Filed with dremel without incident.     Return office visit   10 weeks                  Told patient to return for periodic foot care and evaluation due to potential at risk complications.   Stella Bortle DPM  

## 2022-12-31 DIAGNOSIS — H903 Sensorineural hearing loss, bilateral: Secondary | ICD-10-CM | POA: Diagnosis not present

## 2023-01-05 ENCOUNTER — Ambulatory Visit (HOSPITAL_COMMUNITY)
Admission: RE | Admit: 2023-01-05 | Discharge: 2023-01-05 | Disposition: A | Discharge status: Home / Self Care | Payer: Medicare HMO | Source: Ambulatory Visit | Attending: Physician Assistant | Admitting: Physician Assistant

## 2023-01-05 DIAGNOSIS — Z122 Encounter for screening for malignant neoplasm of respiratory organs: Secondary | ICD-10-CM

## 2023-01-05 DIAGNOSIS — Z87891 Personal history of nicotine dependence: Secondary | ICD-10-CM | POA: Diagnosis not present

## 2023-01-20 DIAGNOSIS — H6121 Impacted cerumen, right ear: Secondary | ICD-10-CM | POA: Diagnosis not present

## 2023-01-20 DIAGNOSIS — E1169 Type 2 diabetes mellitus with other specified complication: Secondary | ICD-10-CM | POA: Diagnosis not present

## 2023-01-20 DIAGNOSIS — J302 Other seasonal allergic rhinitis: Secondary | ICD-10-CM | POA: Diagnosis not present

## 2023-01-20 DIAGNOSIS — E119 Type 2 diabetes mellitus without complications: Secondary | ICD-10-CM | POA: Diagnosis not present

## 2023-01-20 DIAGNOSIS — Z7985 Long-term (current) use of injectable non-insulin antidiabetic drugs: Secondary | ICD-10-CM | POA: Diagnosis not present

## 2023-01-20 DIAGNOSIS — Z794 Long term (current) use of insulin: Secondary | ICD-10-CM | POA: Diagnosis not present

## 2023-01-20 DIAGNOSIS — Z7984 Long term (current) use of oral hypoglycemic drugs: Secondary | ICD-10-CM | POA: Diagnosis not present

## 2023-01-20 DIAGNOSIS — Z961 Presence of intraocular lens: Secondary | ICD-10-CM | POA: Diagnosis not present

## 2023-01-20 DIAGNOSIS — Z87891 Personal history of nicotine dependence: Secondary | ICD-10-CM | POA: Diagnosis not present

## 2023-02-24 DIAGNOSIS — E1169 Type 2 diabetes mellitus with other specified complication: Secondary | ICD-10-CM | POA: Diagnosis not present

## 2023-02-24 DIAGNOSIS — D509 Iron deficiency anemia, unspecified: Secondary | ICD-10-CM | POA: Diagnosis not present

## 2023-02-25 ENCOUNTER — Encounter: Payer: Self-pay | Admitting: Podiatry

## 2023-02-25 ENCOUNTER — Ambulatory Visit: Payer: Medicare HMO | Admitting: Podiatry

## 2023-02-25 DIAGNOSIS — M79675 Pain in left toe(s): Secondary | ICD-10-CM

## 2023-02-25 DIAGNOSIS — B351 Tinea unguium: Secondary | ICD-10-CM

## 2023-02-25 DIAGNOSIS — M79674 Pain in right toe(s): Secondary | ICD-10-CM

## 2023-02-25 DIAGNOSIS — E1140 Type 2 diabetes mellitus with diabetic neuropathy, unspecified: Secondary | ICD-10-CM

## 2023-02-25 DIAGNOSIS — E1149 Type 2 diabetes mellitus with other diabetic neurological complication: Secondary | ICD-10-CM

## 2023-02-25 NOTE — Progress Notes (Signed)
This patient returns to my office for at risk foot care.  This patient requires this care by a professional since this patient will be at risk due to having diabetes and PAD.   Marland KitchenThese nails are painful walking and wearing shoes.  He says he had his calluses treated last month. This patient presents for at risk foot care today.  General Appearance  Alert, conversant and in no acute stress.  Vascular  Dorsalis pedis and posterior tibial  pulses are palpable  bilaterally.  Capillary return is within normal limits  bilaterally. Temperature is within normal limits  bilaterally.  Neurologic  Senn-Weinstein monofilament wire test diminished bilaterally. Muscle power within normal limits bilaterally.  Nails Thick disfigured discolored nails with subungual debris  from hallux to fifth toes bilaterally. No evidence of bacterial infection or drainage bilaterally.  Orthopedic  No limitations of motion  feet .  No crepitus or effusions noted.  No bony pathology or digital deformities noted. DJD 1st  MPJ  B/L.  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    Skin  normotropic skin with no porokeratosis noted bilaterally.  No signs of infections or ulcers noted.   Corn lateral aspect fourth toe right foot.  Asymptomatic porokeratosis sub 4th left  Onychomycosis  Pain in right toes  Pain in left toes   Consent was obtained for treatment procedures.   Mechanical debridement of nails 1-5  bilaterally performed with a nail nipper.  Filed with dremel without incident.     Return office visit   10 weeks                  Told patient to return for periodic foot care and evaluation due to potential at risk complications.   Gardiner Barefoot DPM

## 2023-03-02 DIAGNOSIS — F630 Pathological gambling: Secondary | ICD-10-CM | POA: Diagnosis not present

## 2023-03-02 DIAGNOSIS — E782 Mixed hyperlipidemia: Secondary | ICD-10-CM | POA: Diagnosis not present

## 2023-03-02 DIAGNOSIS — E1169 Type 2 diabetes mellitus with other specified complication: Secondary | ICD-10-CM | POA: Diagnosis not present

## 2023-03-02 DIAGNOSIS — Z23 Encounter for immunization: Secondary | ICD-10-CM | POA: Diagnosis not present

## 2023-03-02 DIAGNOSIS — I10 Essential (primary) hypertension: Secondary | ICD-10-CM | POA: Diagnosis not present

## 2023-03-02 DIAGNOSIS — Z0001 Encounter for general adult medical examination with abnormal findings: Secondary | ICD-10-CM | POA: Diagnosis not present

## 2023-03-02 DIAGNOSIS — E1165 Type 2 diabetes mellitus with hyperglycemia: Secondary | ICD-10-CM | POA: Diagnosis not present

## 2023-03-02 DIAGNOSIS — D509 Iron deficiency anemia, unspecified: Secondary | ICD-10-CM | POA: Diagnosis not present

## 2023-03-02 DIAGNOSIS — Z0000 Encounter for general adult medical examination without abnormal findings: Secondary | ICD-10-CM | POA: Diagnosis not present

## 2023-03-23 DIAGNOSIS — M9903 Segmental and somatic dysfunction of lumbar region: Secondary | ICD-10-CM | POA: Diagnosis not present

## 2023-03-23 DIAGNOSIS — M9902 Segmental and somatic dysfunction of thoracic region: Secondary | ICD-10-CM | POA: Diagnosis not present

## 2023-03-23 DIAGNOSIS — M546 Pain in thoracic spine: Secondary | ICD-10-CM | POA: Diagnosis not present

## 2023-03-23 DIAGNOSIS — M9905 Segmental and somatic dysfunction of pelvic region: Secondary | ICD-10-CM | POA: Diagnosis not present

## 2023-03-23 DIAGNOSIS — M62830 Muscle spasm of back: Secondary | ICD-10-CM | POA: Diagnosis not present

## 2023-03-24 ENCOUNTER — Other Ambulatory Visit: Payer: Self-pay | Admitting: Cardiovascular Disease

## 2023-03-30 DIAGNOSIS — H43391 Other vitreous opacities, right eye: Secondary | ICD-10-CM | POA: Diagnosis not present

## 2023-03-30 DIAGNOSIS — Z794 Long term (current) use of insulin: Secondary | ICD-10-CM | POA: Diagnosis not present

## 2023-03-30 DIAGNOSIS — E119 Type 2 diabetes mellitus without complications: Secondary | ICD-10-CM | POA: Diagnosis not present

## 2023-03-30 DIAGNOSIS — Z961 Presence of intraocular lens: Secondary | ICD-10-CM | POA: Diagnosis not present

## 2023-04-05 ENCOUNTER — Other Ambulatory Visit: Payer: Self-pay | Admitting: Cardiovascular Disease

## 2023-04-13 DIAGNOSIS — E1165 Type 2 diabetes mellitus with hyperglycemia: Secondary | ICD-10-CM | POA: Diagnosis not present

## 2023-05-05 ENCOUNTER — Ambulatory Visit: Payer: Medicare HMO | Admitting: Podiatry

## 2023-05-05 ENCOUNTER — Encounter: Payer: Self-pay | Admitting: Podiatry

## 2023-05-05 DIAGNOSIS — E1140 Type 2 diabetes mellitus with diabetic neuropathy, unspecified: Secondary | ICD-10-CM

## 2023-05-05 DIAGNOSIS — L84 Corns and callosities: Secondary | ICD-10-CM | POA: Diagnosis not present

## 2023-05-06 NOTE — Progress Notes (Signed)
Subjective:   Patient ID: Luke [name], male   DOB: 72 y.o.   MRN: [id number]   HPI Long-term diabetic with chronic lesions plantar left that are sore and hard to wear shoe gear with    ROS      Objective:  Physical Exam  Neurovascular status intact with 3 separate keratotic lesions of the left forefoot that have loosened course painful to press with patient having diminishment of sharp dull vibratory     Assessment:  Porokeratotic lesion along with neuropathy diabetic     Plan:  H&P reviewed and explained risk factors of him trimming these himself.  I went ahead today and I debrided 3 lesions no iatrogenic bleeding reappoint routine care

## 2023-05-11 ENCOUNTER — Ambulatory Visit (INDEPENDENT_AMBULATORY_CARE_PROVIDER_SITE_OTHER): Payer: Medicare HMO | Admitting: Podiatry

## 2023-05-11 ENCOUNTER — Encounter: Payer: Self-pay | Admitting: Podiatry

## 2023-05-11 DIAGNOSIS — M79675 Pain in left toe(s): Secondary | ICD-10-CM

## 2023-05-11 DIAGNOSIS — E1149 Type 2 diabetes mellitus with other diabetic neurological complication: Secondary | ICD-10-CM | POA: Diagnosis not present

## 2023-05-11 DIAGNOSIS — M79674 Pain in right toe(s): Secondary | ICD-10-CM | POA: Diagnosis not present

## 2023-05-11 DIAGNOSIS — E1140 Type 2 diabetes mellitus with diabetic neuropathy, unspecified: Secondary | ICD-10-CM

## 2023-05-11 DIAGNOSIS — B351 Tinea unguium: Secondary | ICD-10-CM | POA: Diagnosis not present

## 2023-05-11 NOTE — Progress Notes (Signed)
This patient returns to my office for at risk foot care.  This patient requires this care by a professional since this patient will be at risk due to having diabetes and PAD.   Marland KitchenThese nails are painful walking and wearing shoes.  He says he had his calluses treated last month. This patient presents for at risk foot care today.  General Appearance  Alert, conversant and in no acute stress.  Vascular  Dorsalis pedis and posterior tibial  pulses are palpable  bilaterally.  Capillary return is within normal limits  bilaterally. Temperature is within normal limits  bilaterally.  Neurologic  Senn-Weinstein monofilament wire test diminished bilaterally. Muscle power within normal limits bilaterally.  Nails Thick disfigured discolored nails with subungual debris  from hallux to fifth toes bilaterally. No evidence of bacterial infection or drainage bilaterally.  Orthopedic  No limitations of motion  feet .  No crepitus or effusions noted.  No bony pathology or digital deformities noted. DJD 1st  MPJ  B/L.  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    Skin  normotropic skin with no porokeratosis noted bilaterally.  No signs of infections or ulcers noted.     Asymptomatic porokeratosis sub 4th left  Onychomycosis  Pain in right toes  Pain in left toes   Consent was obtained for treatment procedures.   Mechanical debridement of nails 1-5  bilaterally performed with a nail nipper.  Filed with dremel without incident.     Return office visit   10 weeks                  Told patient to return for periodic foot care and evaluation due to potential at risk complications.   Helane Gunther DPM

## 2023-05-12 DIAGNOSIS — E1165 Type 2 diabetes mellitus with hyperglycemia: Secondary | ICD-10-CM | POA: Diagnosis not present

## 2023-05-12 DIAGNOSIS — I10 Essential (primary) hypertension: Secondary | ICD-10-CM | POA: Diagnosis not present

## 2023-05-12 DIAGNOSIS — Z7984 Long term (current) use of oral hypoglycemic drugs: Secondary | ICD-10-CM | POA: Diagnosis not present

## 2023-05-12 DIAGNOSIS — Z794 Long term (current) use of insulin: Secondary | ICD-10-CM | POA: Diagnosis not present

## 2023-05-12 DIAGNOSIS — Z713 Dietary counseling and surveillance: Secondary | ICD-10-CM | POA: Diagnosis not present

## 2023-05-12 DIAGNOSIS — E1159 Type 2 diabetes mellitus with other circulatory complications: Secondary | ICD-10-CM | POA: Diagnosis not present

## 2023-05-12 DIAGNOSIS — E1169 Type 2 diabetes mellitus with other specified complication: Secondary | ICD-10-CM | POA: Diagnosis not present

## 2023-05-12 DIAGNOSIS — Z87891 Personal history of nicotine dependence: Secondary | ICD-10-CM | POA: Diagnosis not present

## 2023-05-12 DIAGNOSIS — Z79899 Other long term (current) drug therapy: Secondary | ICD-10-CM | POA: Diagnosis not present

## 2023-05-25 ENCOUNTER — Inpatient Hospital Stay: Payer: Medicare HMO | Attending: Hematology

## 2023-05-25 DIAGNOSIS — D509 Iron deficiency anemia, unspecified: Secondary | ICD-10-CM | POA: Diagnosis not present

## 2023-05-25 DIAGNOSIS — Z801 Family history of malignant neoplasm of trachea, bronchus and lung: Secondary | ICD-10-CM | POA: Diagnosis not present

## 2023-05-25 DIAGNOSIS — D500 Iron deficiency anemia secondary to blood loss (chronic): Secondary | ICD-10-CM

## 2023-05-25 DIAGNOSIS — Z87891 Personal history of nicotine dependence: Secondary | ICD-10-CM | POA: Diagnosis not present

## 2023-05-25 DIAGNOSIS — Z79899 Other long term (current) drug therapy: Secondary | ICD-10-CM | POA: Diagnosis not present

## 2023-05-25 LAB — CBC WITH DIFFERENTIAL/PLATELET
Abs Immature Granulocytes: 0.04 10*3/uL (ref 0.00–0.07)
Basophils Absolute: 0.1 10*3/uL (ref 0.0–0.1)
Basophils Relative: 1 %
Eosinophils Absolute: 0.2 10*3/uL (ref 0.0–0.5)
Eosinophils Relative: 2 %
HCT: 47.4 % (ref 39.0–52.0)
Hemoglobin: 15 g/dL (ref 13.0–17.0)
Immature Granulocytes: 1 %
Lymphocytes Relative: 15 %
Lymphs Abs: 1.3 10*3/uL (ref 0.7–4.0)
MCH: 28 pg (ref 26.0–34.0)
MCHC: 31.6 g/dL (ref 30.0–36.0)
MCV: 88.6 fL (ref 80.0–100.0)
Monocytes Absolute: 0.7 10*3/uL (ref 0.1–1.0)
Monocytes Relative: 9 %
Neutro Abs: 6.5 10*3/uL (ref 1.7–7.7)
Neutrophils Relative %: 72 %
Platelets: 315 10*3/uL (ref 150–400)
RBC: 5.35 MIL/uL (ref 4.22–5.81)
RDW: 15.4 % (ref 11.5–15.5)
WBC: 8.8 10*3/uL (ref 4.0–10.5)
nRBC: 0 % (ref 0.0–0.2)

## 2023-05-25 LAB — IRON AND TIBC
Iron: 55 ug/dL (ref 45–182)
Saturation Ratios: 15 % — ABNORMAL LOW (ref 17.9–39.5)
TIBC: 368 ug/dL (ref 250–450)
UIBC: 313 ug/dL

## 2023-05-25 LAB — FERRITIN: Ferritin: 14 ng/mL — ABNORMAL LOW (ref 24–336)

## 2023-05-27 IMAGING — DX DG CHEST 2V
2 series · 2 of 2 positions shown · non-contrast
Comparison: Chest x-ray dated December 16, 2017

CLINICAL DATA: Productive cough

EXAM:
CHEST - 2 VIEW

[chest pa]
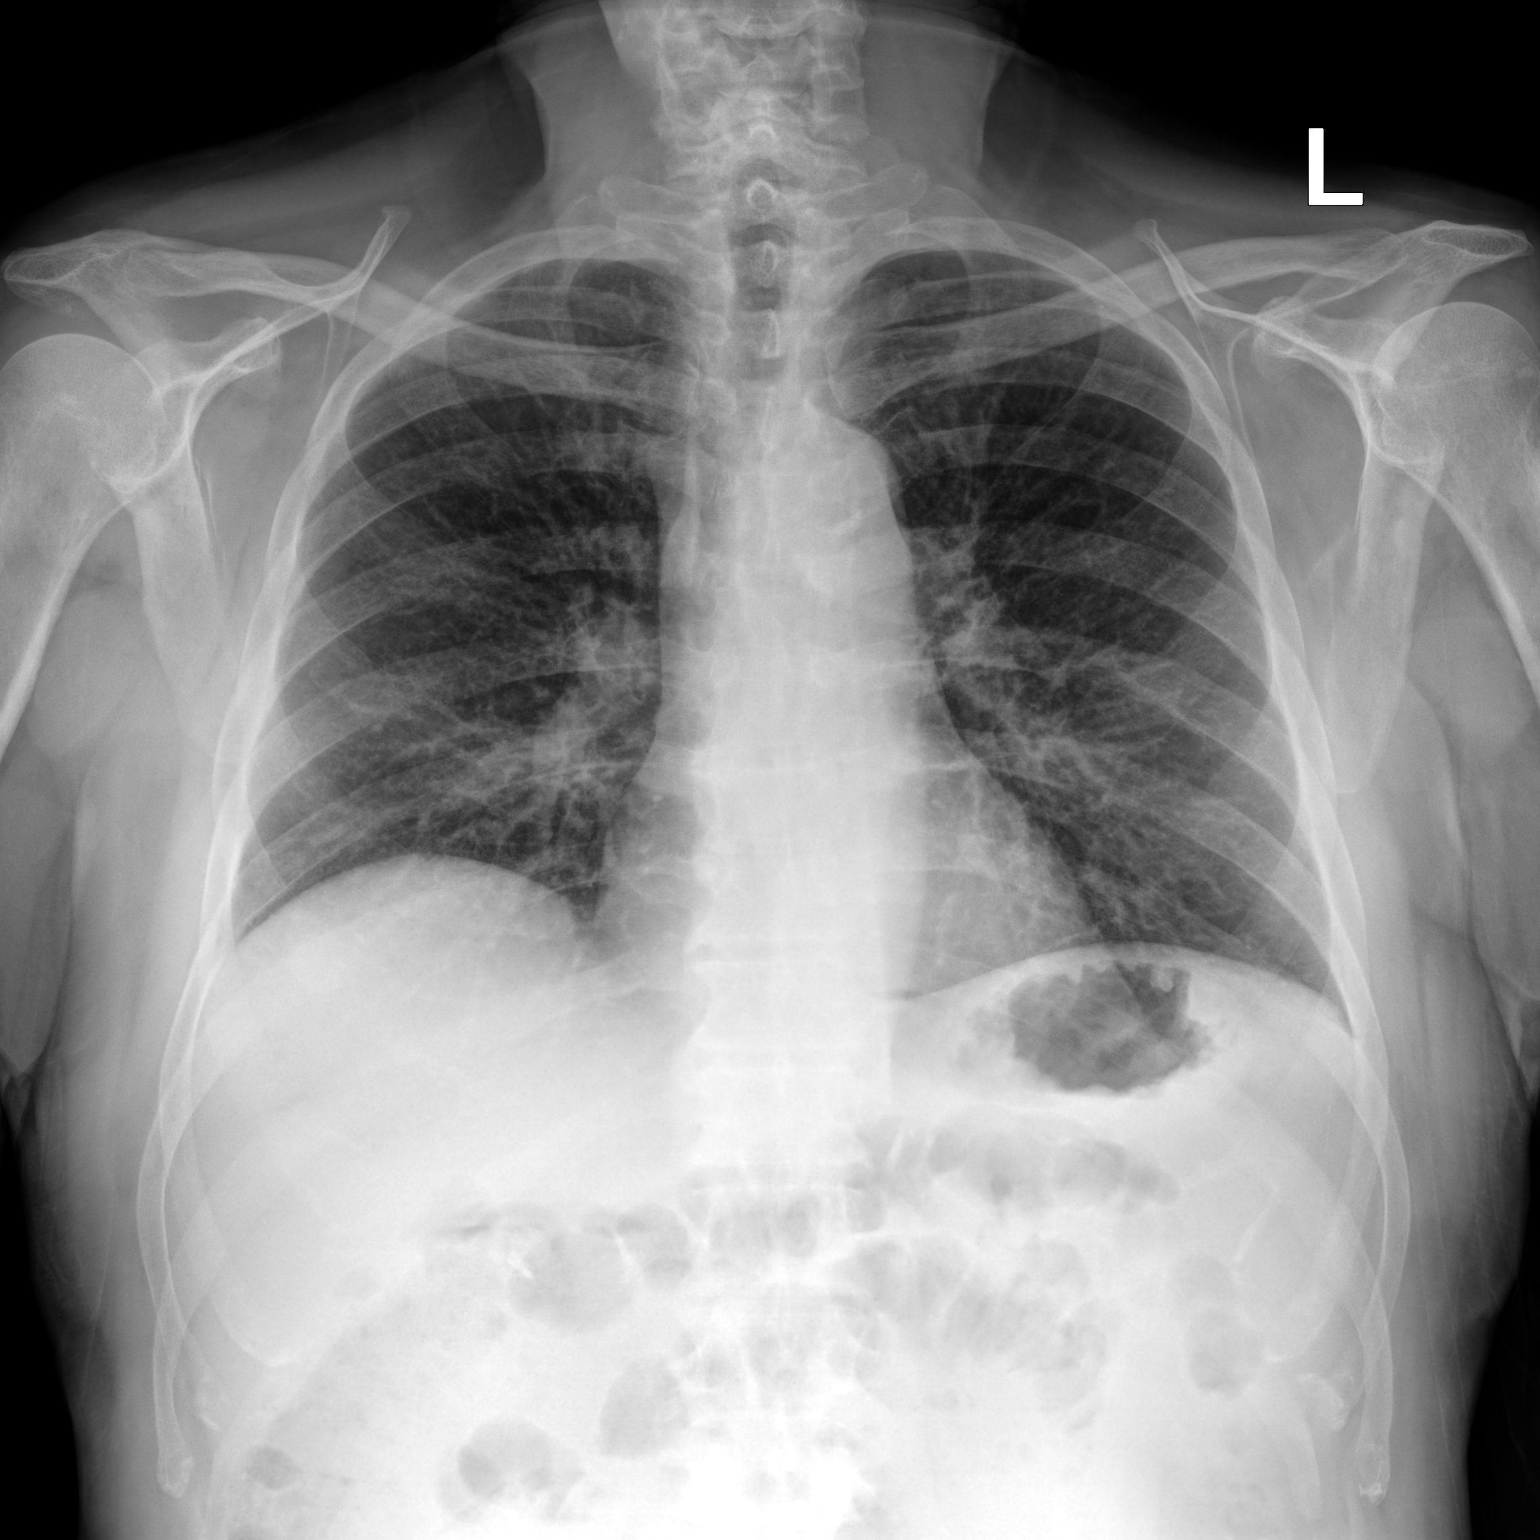

[chest lat]
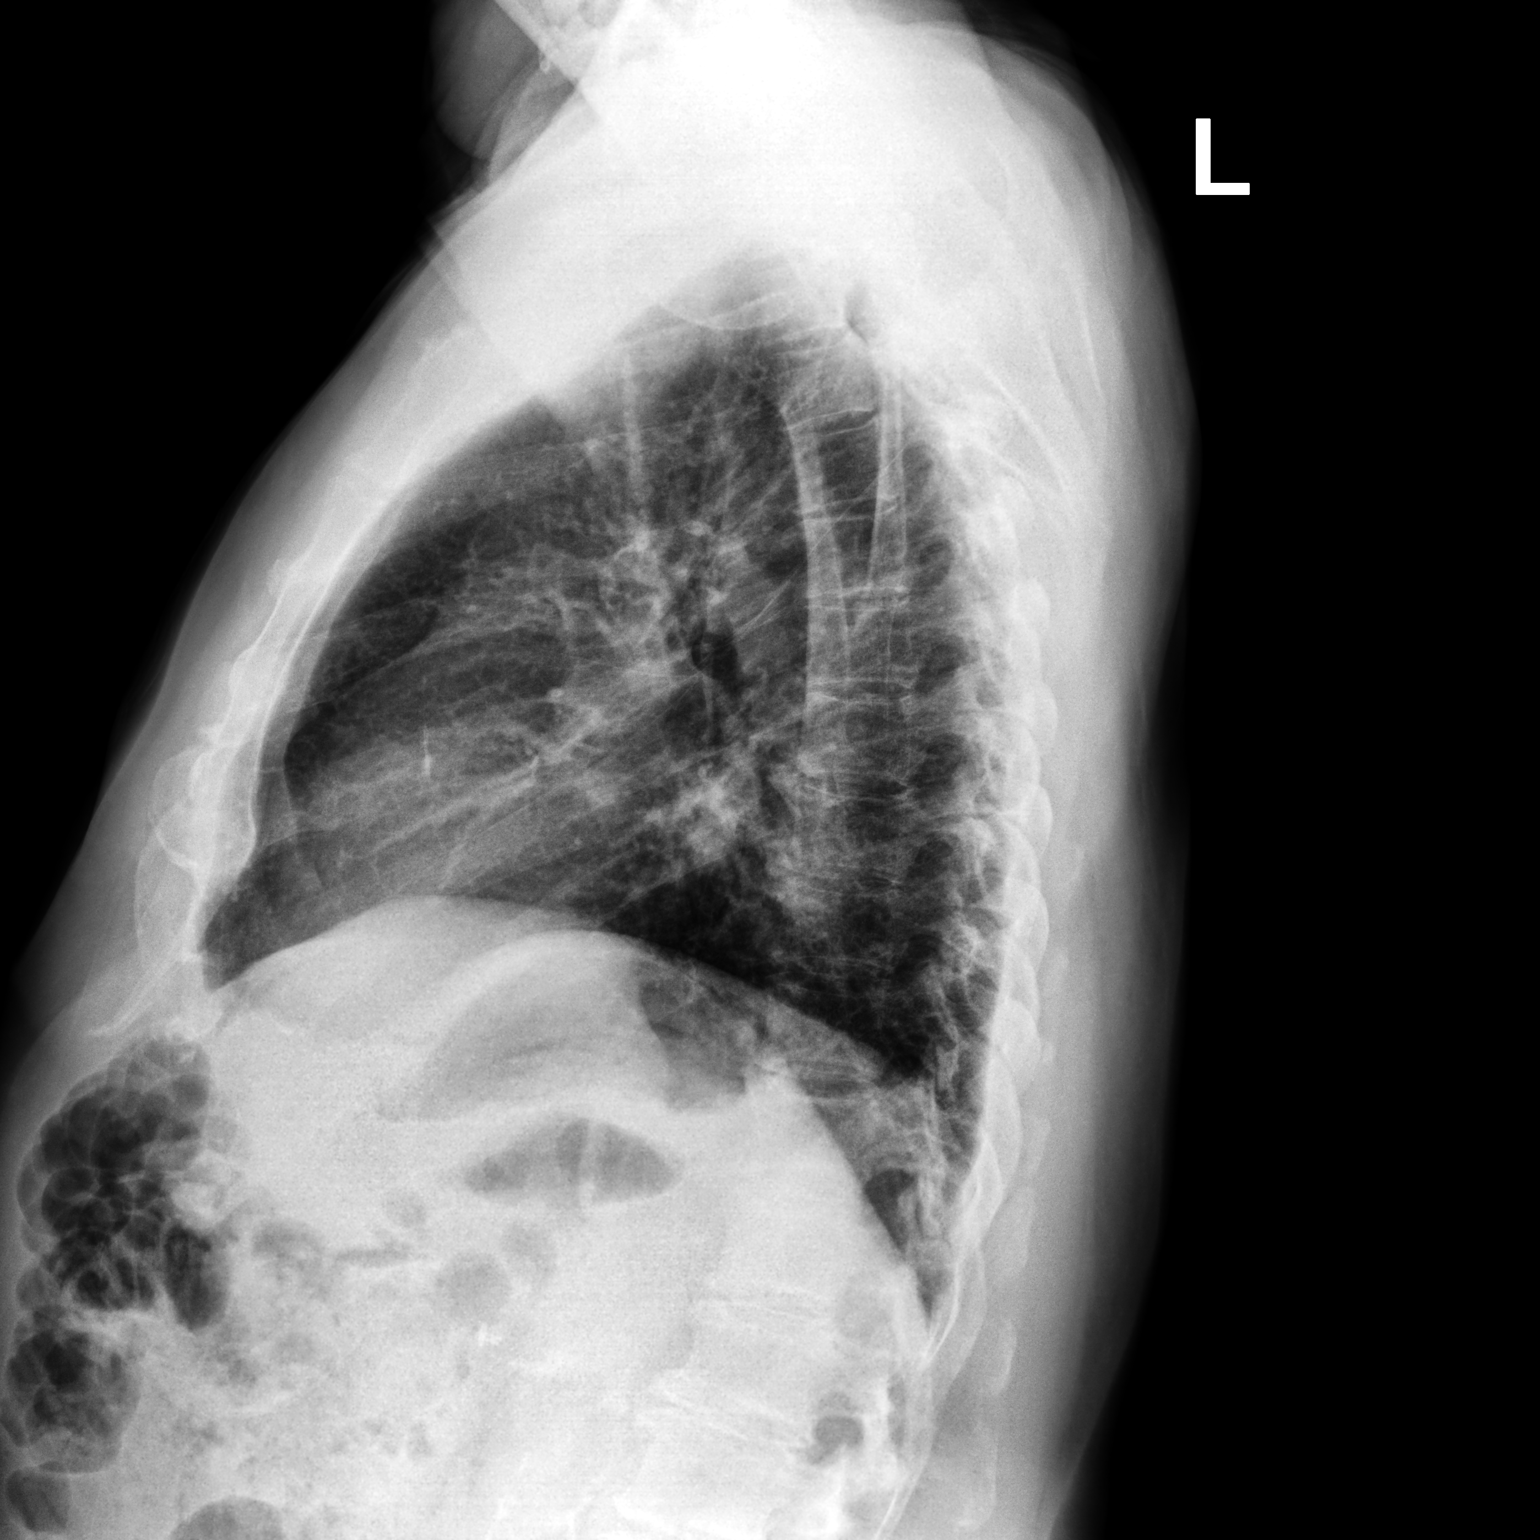

[2 of 2 positions shown; findings below may reference images not displayed]

FINDINGS: Cardiac and mediastinal contours within normal limits. Bilateral
peribronchial cuffing. No focal consolidation. No evidence of
pleural effusion or pneumothorax.
IMPRESSION: Bilateral peribronchial cuffing, findings can be seen the setting of
small airways disease, including infectious bronchitis. No focal
consolidation to suggest pneumonia.

## 2023-05-31 NOTE — Progress Notes (Unsigned)
[facility] [address], [place] [address]   CLINIC:  Medical Oncology/Hematology  PCP:  Benita Stabile, MD [address] [place] [address] [phone]   REASON FOR VISIT:  Follow-up for iron deficiency anemia   PRIOR THERAPY: Oral iron   CURRENT THERAPY: Intermittent PRBCs transfusions & IV iron infusions  INTERVAL HISTORY:   Luke [name] 72 y.o. male returns for routine follow-up of  iron deficiency anemia.  He was last seen by Rojelio Brenner PA-C on 05/18/2022.  Most recent IV iron with Venofer 300 mg x 2 on 06/03/2022 and 06/10/2022.   At today's visit, he reports continuing to feeling fairly well, but does note some significant fatigue and recurrent restless leg symptoms.  He does not have any pica.  He previously was having black stools that were present even before starting iron pills; now taking iron pill daily, but denies any black bowel movements at this time.  No bright red blood per rectum, melena, or epistaxis since his last visit.  He denies any chest pain, dyspnea on exertion, lightheadedness, or syncope.   He has little to no energy and 50% appetite. He endorses that he is maintaining a stable weight.   ASSESSMENT & PLAN:  1.  Severe microcytic anemia / iron deficiency anemia - Hematology work-up (09/30/2021) significant for severe iron deficiency anemia.  Additional labs showed normal creatinine, copper, B12, methylmalonic acid, folate.  Normal SPEP and LDH.  Low reticulocytes for degree of anemia that was present. 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  Also required blood transfusion 7 years ago. - Last iron with IV Venofer 300 mg x 2 in 06-19-2022- He has some recurrent restless legs and fatigue.  No pica. - He reports having intermittent black stools.  Denies  any bleeding per rectum. - Most recent labs (11/12/2022) Hgb 14.1/MCV 84.  Ferritin 120, iron saturation 8% with normal TIBC 285. - Most likely iron deficiency anemia from chronic blood loss. - Follows with [facility] Elnoria Howard of [facility].  Reportedly had EGD/colonoscopy on 10/14/2021, which were normal per patient report. - PLAN: He has low iron saturation, but with normal ferritin and no anemia.  However, he is symptomatic with severe fatigue.  Recommend IV Venofer 400 mg x 1.   - Continue oral iron supplement. - Labs in 6 months with phone visit 1 week after   2.  Smoking history: - Patient has significant smoking history. - LDCT chest (11/13/2021): Lung RADS category 1S, negative for any signs of lung cancer, both signs of aortic atherosclerosis and left main and three-vessel CAD and findings suggestive of underlying COPD - PLAN: Continue annual LDCT chest as part of LCS program - next due May 2024   3.  Social/family history: - Lives at home with his wife.  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  Smoked 2 packs/day for 50 years, quit in approximately 06/20/2019. - Brother died of lung cancer.  Patient grew up in foster home and does not know much of family history.   PLAN SUMMARY: >> Venofer 400 mg x 1 >> Due for LDCT chest  >> Labs in 6 months (CBC/D, ferritin, iron/TIBC) >> OFFICE visit 1 week after labs     REVIEW OF SYSTEMS: ***  Review of Systems - Oncology   PHYSICAL EXAM:  ECOG PERFORMANCE STATUS: {CHL ONC ECOG UJ:[id number]} *** There were no vitals filed for this visit. There were no vitals filed for this visit. Physical Exam  PAST MEDICAL/SURGICAL HISTORY:  Past Medical History:  Diagnosis Date   CAD (coronary artery disease) 10/02/2010   nuclear study show normal perfusion on medical therapy without scar or ischemia. EF 64%   GERD (gastroesophageal reflux disease)    Heart murmur    "mild one" (12/19/2017)   Hematochezia  11/19/2009   History of blood transfusion    "low HgB" (12/19/2017)   Hyperlipidemia    Hypertension    Iron deficiency anemia 11/19/2009   Osteoarthritis    "was in my knees" (12/19/2017)   PAD (peripheral artery disease) (HCC)    Recurrent pancreatitis 11/19/2009   Sleep apnea    "trial mask in the 1990s; haven't used one since" (12/19/2017)   Type II diabetes mellitus (HCC)    Past Surgical History:  Procedure Laterality Date   ABDOMINAL AORTOGRAM W/LOWER EXTREMITY N/A 12/19/2017   Procedure: ABDOMINAL AORTOGRAM W/LOWER EXTREMITY;  Surgeon: Runell Gess, MD;  Location: [facility] INVASIVE CV LAB;  Service: Cardiovascular;  Laterality: N/A;   CARDIAC CATHETERIZATION  08/1999   which revealed a mild coronary artery disease with 60 and 70 and 80% stenosis in a small first diagonal vessel at the LAD, 50% mid LAD stenosis, 50% ostial left circumflrx stenosis, and 40% ostial and proximal intermediate stenosis. he had 10 to 20% irregularities of his mid right coronary artery.   CHOLECYSTECTOMY N/A 07/08/2017   Procedure: LAPAROSCOPIC CHOLECYSTECTOMY;  Surgeon: Franky Macho, MD;  Location: [facility] ORS;  Service: General;  Laterality: N/A;   CIRCUMCISION  07/17/2012   Procedure: CIRCUMCISION ADULT;  Surgeon: Crecencio Mc, MD;  Location: [facility] ORS;  Service: Urology;  Laterality: N/A;   COLONOSCOPY  02/14/06   M JENKINS   COLONOSCOPY N/A 10/28/2014   Procedure: COLONOSCOPY;  Surgeon: Malissa Hippo, MD;  Location: [facility] ENDO SUITE;  Service: Endoscopy;  Laterality: N/A;  730   ENTEROSCOPY N/A 10/04/2014   Procedure: ENTEROSCOPY;  Surgeon: Jeani Hawking, MD;  Location: [facility] ENDOSCOPY;  Service: Endoscopy;  Laterality: N/A;   ERCP  12/25/1996   ROURK   GIVENS CAPSULE STUDY N/A 10/04/2014   Procedure: GIVENS CAPSULE STUDY;  Surgeon: Jeani Hawking, MD;  Location: [facility] ENDOSCOPY;  Service: Endoscopy;  Laterality: N/A;   GIVENS CAPSULE STUDY N/A 08/04/2018   Procedure: GIVENS CAPSULE STUDY;  Surgeon: Malissa Hippo, MD;   Location: [facility] ENDO SUITE;  Service: Endoscopy;  Laterality: N/A;  7:00am   JOINT REPLACEMENT     PERIPHERAL VASCULAR ATHERECTOMY Left 12/19/2017   Procedure: PERIPHERAL VASCULAR ATHERECTOMY;  Surgeon: Runell Gess, MD;  Location: [facility] INVASIVE CV LAB;  Service: Cardiovascular;  Laterality: Left;   PERIPHERAL VASCULAR INTERVENTION Left 12/19/2017   Procedure: PERIPHERAL VASCULAR INTERVENTION;  Surgeon: Runell Gess, MD;  Location: [facility] INVASIVE CV LAB;  Service: Cardiovascular;  Laterality: Left;  stent   SMALL BOWEL GIVENS  11/25/2008   TOTAL KNEE ARTHROPLASTY  2007-2008   UPPER GASTROINTESTINAL ENDOSCOPY  11/08/2008   EGD TCS    SOCIAL HISTORY:  Social History   Socioeconomic History   Marital status: Married    Spouse name: Not on file   Number of children: Not on file   Years of education: Not on file   Highest education level: Not on file  Occupational History   Not on file  Tobacco Use   Smoking status: Former    Current packs/day: 0.00    Average packs/day: 2.0 packs/day for 50.0 years (100.0 ttl pk-yrs)    Types: Cigarettes    Start date: 09/27/1964    Quit date: 09/28/2014    Years since quitting: 8.6   Smokeless tobacco: Never  Vaping Use   Vaping status: Never Used  Substance and Sexual Activity   Alcohol use: Never    Alcohol/week: 0.0 standard drinks of alcohol   Drug use: Never   Sexual activity: Not Currently  Other Topics Concern   Not on file  Social History Narrative   Not on file   Social Determinants of Health   Financial Resource Strain: Not on file  Food Insecurity: Not on file  Transportation Needs: Not on file  Physical Activity: Not on file  Stress: Not on file  Social Connections: Not on file  Intimate Partner Violence: Not on file    FAMILY HISTORY:  Family History  Problem Relation Age of Onset   Healthy Sister    Healthy Brother    Healthy Sister    Healthy Brother    Healthy Brother    Healthy Daughter    Healthy Daughter      CURRENT MEDICATIONS:  Outpatient Encounter Medications as of 06/01/2023  Medication Sig Note   amLODipine (NORVASC) 10 MG tablet TAKE 1 TABLET EVERY DAY    aspirin 81 MG tablet Take 1 tablet (81 mg total) by mouth daily. HOLD FOR 1 WEEK, THEN RESUME IF NO BLEEDING PROBLEMS    atorvastatin (LIPITOR) 80 MG tablet TAKE 1 TABLET EVERY DAY    bisoprolol-hydrochlorothiazide (ZIAC) 2.5-6.25 MG tablet     clobetasol (TEMOVATE) 0.05 % external solution Apply 1 application. topically at bedtime.    empagliflozin (JARDIANCE) 25 MG TABS tablet Take 25 mg by mouth daily.     etodolac (LODINE XL) 400 MG 24 hr tablet     FARXIGA 10 MG TABS tablet Take 1 tablet every day by oral route.    Ferrous Sulfate Dried (HIGH POTENCY IRON) 65 MG TABS     fexofenadine (ALLEGRA) 180 MG tablet Take 1 tablet every day by oral route.    finasteride (PROSCAR) 5 MG tablet Take 5 mg by mouth daily.    Insulin Degludec-Liraglutide (XULTOPHY) 100-3.6 UNIT-MG/ML SOPN Inject 24 Units into the skin daily.     INVOKANA 300 MG TABS tablet     levocetirizine (XYZAL) 5 MG tablet levocetirizine 5 mg tablet    Loratadine 10 MG CAPS Claritin    metFORMIN (GLUCOPHAGE) 500 MG tablet Take 1,000 mg by mouth 2 (two) times daily.    Multiple Vitamins-Minerals (CENTRUM SILVER ULTRA MENS PO) Centrum Silver Ultra Men's    ONGLYZA 5 MG TABS tablet     pantoprazole (PROTONIX) 40 MG tablet TAKE 1 TABLET EVERY DAY    ramipril (ALTACE) 10 MG capsule Take 1 capsule (10 mg total) by mouth 2 (two) times daily.    rOPINIRole (REQUIP) 0.25 MG tablet  11/26/2022: Patient taking 2 caps daily morning and evening    tamsulosin (FLOMAX) 0.4 MG CAPS capsule Take 0.4 mg by mouth.    vitamin B-12 (CYANOCOBALAMIN) 100 MCG tablet Vitamin B-12    No facility-administered encounter medications on file as of 06/01/2023.    ALLERGIES:  Allergies  Allergen Reactions   Morphine And Codeine Itching    LABORATORY DATA:  I have reviewed the labs as listed.   CBC    Component Value  Date/Time   WBC 8.8 05/25/2023 1436   RBC 5.35 05/25/2023 1436   HGB 15.0 05/25/2023 1436   HGB 11.0 (L) 12/14/2017 1119   HCT 47.4 05/25/2023 1436   HCT 36.2 (L) 12/14/2017 1119   PLT 315 05/25/2023 1436   PLT 414 12/14/2017 1119   MCV 88.6 05/25/2023 1436   MCV 72 (L) 12/14/2017 1119   MCH 28.0 05/25/2023 1436   MCHC 31.6 05/25/2023 1436   RDW 15.4 05/25/2023 1436   RDW 18.3 (H) 12/14/2017 1119   LYMPHSABS 1.3 05/25/2023 1436   MONOABS 0.7 05/25/2023 1436   EOSABS 0.2 05/25/2023 1436   BASOSABS 0.1 05/25/2023 1436      Latest Ref Rng & Units 12/20/2017    2:23 AM 12/14/2017   11:19 AM 07/07/2017   10:32 PM  CMP  Glucose 70 - 99 mg/dL 440  347  425   BUN 8 - 23 mg/dL 8  10  12    Creatinine 0.61 - 1.24 mg/dL 9.56  3.87  5.64   Sodium 135 - 145 mmol/L 139  141  135   Potassium 3.5 - 5.1 mmol/L 3.6  4.7  3.5   Chloride 98 - 111 mmol/L 102  100  98   CO2 22 - 32 mmol/L 29  25  24    Calcium 8.9 - 10.3 mg/dL 8.3  33.2  9.2   Total Protein 6.5 - 8.1 g/dL   6.8   Total Bilirubin 0.3 - 1.2 mg/dL   0.4   Alkaline Phos 38 - 126 U/L   69   AST 15 - 41 U/L   20   ALT 17 - 63 U/L   17     DIAGNOSTIC IMAGING:  I have independently reviewed the relevant imaging and discussed with the patient.   WRAP UP:  All questions were answered. The patient knows to call the clinic with any problems, questions or concerns.  Medical decision making: ***  Time spent on visit: I spent *** minutes counseling the patient face to face. The total time spent in the appointment was *** minutes and more than 50% was on counseling.  Carnella Guadalajara, PA-C  ***

## 2023-06-01 ENCOUNTER — Inpatient Hospital Stay: Payer: Medicare HMO | Admitting: Physician Assistant

## 2023-06-01 VITALS — BP 164/61 | HR 72 | Temp 97.0°F | Resp 18 | Wt 174.8 lb

## 2023-06-01 DIAGNOSIS — Z79899 Other long term (current) drug therapy: Secondary | ICD-10-CM | POA: Diagnosis not present

## 2023-06-01 DIAGNOSIS — D500 Iron deficiency anemia secondary to blood loss (chronic): Secondary | ICD-10-CM

## 2023-06-01 DIAGNOSIS — D509 Iron deficiency anemia, unspecified: Secondary | ICD-10-CM | POA: Diagnosis not present

## 2023-06-01 DIAGNOSIS — Z87891 Personal history of nicotine dependence: Secondary | ICD-10-CM | POA: Diagnosis not present

## 2023-06-01 DIAGNOSIS — Z801 Family history of malignant neoplasm of trachea, bronchus and lung: Secondary | ICD-10-CM | POA: Diagnosis not present

## 2023-06-01 NOTE — Patient Instructions (Signed)
[facility] at [facility] **VISIT SUMMARY & IMPORTANT INSTRUCTIONS **   You were seen today by Rojelio Brenner PA-C for your follow-up visit.    IRON DEFICIENCY ANEMIA Your blood levels look good.  You are not anemic at this time. However, your iron levels have dropped lower than they were at your last appointment. We will schedule you for IV iron x 1 dose. Continue taking iron tablet daily. We will check your labs again in 6 months and schedule you for a phone visit after those labs have been completed. Call our office if you have any symptoms of worsening fatigue, cravings for ice chips, or other symptoms concerning for low iron.  FOLLOW-UP APPOINTMENT: Labs in 6 months with phone visit 1 week after.  ** Thank you for trusting me with your healthcare!  I strive to provide all of my patients with quality care at each visit.  If you receive a survey for this visit, I would be so grateful to you for taking the time to provide feedback.  Thank you in advance!  ~ Marzelle Rutten                   Dr. Doreatha Massed   &   Rojelio Brenner, PA-C   - - - - - - - - - - - - - - - - - -    Thank you for choosing [facility] at [facility] to provide your oncology and hematology care.  To afford each patient quality time with our provider, please arrive at least 15 minutes before your scheduled appointment time.   If you have a lab appointment with the Cancer Center please come in thru the Main Entrance and check in at the main information desk.  You need to re-schedule your appointment should you arrive 10 or more minutes late.  We strive to give you quality time with our providers, and arriving late affects you and other patients whose appointments are after yours.  Also, if you no show three or more times for appointments you may be dismissed from the clinic at the providers discretion.     Again, thank you for choosing [facility].  Our  hope is that these requests will decrease the amount of time that you wait before being seen by our physicians.       _____________________________________________________________  Should you have questions after your visit to [facility], please contact our office at [phone] and follow the prompts.  Our office hours are 8:00 a.m. and 4:30 p.m. Monday - Friday.  Please note that voicemails left after 4:00 p.m. may not be returned until the following business day.  We are closed weekends and major holidays.  You do have access to a nurse 24-7, just call the main number to the clinic [phone] and do not press any options, hold on the line and a nurse will answer the phone.    For prescription refill requests, have your pharmacy contact our office and allow 72 hours.

## 2023-06-07 ENCOUNTER — Other Ambulatory Visit: Payer: Self-pay | Admitting: Physician Assistant

## 2023-06-07 NOTE — Progress Notes (Signed)
Patient has updated his insurance for 2025. We will schedule him for IV Feraheme x 1 to be completed after 06/22/2023.

## 2023-06-17 DIAGNOSIS — M9902 Segmental and somatic dysfunction of thoracic region: Secondary | ICD-10-CM | POA: Diagnosis not present

## 2023-06-17 DIAGNOSIS — M546 Pain in thoracic spine: Secondary | ICD-10-CM | POA: Diagnosis not present

## 2023-06-18 DIAGNOSIS — Z6829 Body mass index (BMI) 29.0-29.9, adult: Secondary | ICD-10-CM | POA: Diagnosis not present

## 2023-06-18 DIAGNOSIS — S20212A Contusion of left front wall of thorax, initial encounter: Secondary | ICD-10-CM | POA: Diagnosis not present

## 2023-06-18 DIAGNOSIS — T161XXA Foreign body in right ear, initial encounter: Secondary | ICD-10-CM | POA: Diagnosis not present

## 2023-06-18 DIAGNOSIS — S2232XA Fracture of one rib, left side, initial encounter for closed fracture: Secondary | ICD-10-CM | POA: Diagnosis not present

## 2023-06-18 DIAGNOSIS — R030 Elevated blood-pressure reading, without diagnosis of hypertension: Secondary | ICD-10-CM | POA: Diagnosis not present

## 2023-06-18 DIAGNOSIS — E663 Overweight: Secondary | ICD-10-CM | POA: Diagnosis not present

## 2023-06-23 DIAGNOSIS — E1169 Type 2 diabetes mellitus with other specified complication: Secondary | ICD-10-CM | POA: Diagnosis not present

## 2023-06-23 DIAGNOSIS — D509 Iron deficiency anemia, unspecified: Secondary | ICD-10-CM | POA: Diagnosis not present

## 2023-06-24 LAB — LAB REPORT - SCANNED
A1c: 7.8
EGFR: 95

## 2023-06-28 DIAGNOSIS — N400 Enlarged prostate without lower urinary tract symptoms: Secondary | ICD-10-CM | POA: Diagnosis not present

## 2023-06-28 DIAGNOSIS — E870 Hyperosmolality and hypernatremia: Secondary | ICD-10-CM | POA: Diagnosis not present

## 2023-06-28 DIAGNOSIS — I10 Essential (primary) hypertension: Secondary | ICD-10-CM | POA: Diagnosis not present

## 2023-06-28 DIAGNOSIS — E782 Mixed hyperlipidemia: Secondary | ICD-10-CM | POA: Diagnosis not present

## 2023-06-28 DIAGNOSIS — E1169 Type 2 diabetes mellitus with other specified complication: Secondary | ICD-10-CM | POA: Diagnosis not present

## 2023-06-28 DIAGNOSIS — I739 Peripheral vascular disease, unspecified: Secondary | ICD-10-CM | POA: Diagnosis not present

## 2023-06-28 DIAGNOSIS — F630 Pathological gambling: Secondary | ICD-10-CM | POA: Diagnosis not present

## 2023-06-28 DIAGNOSIS — G4710 Hypersomnia, unspecified: Secondary | ICD-10-CM | POA: Diagnosis not present

## 2023-06-28 DIAGNOSIS — D509 Iron deficiency anemia, unspecified: Secondary | ICD-10-CM | POA: Diagnosis not present

## 2023-06-28 DIAGNOSIS — E1159 Type 2 diabetes mellitus with other circulatory complications: Secondary | ICD-10-CM | POA: Diagnosis not present

## 2023-06-28 DIAGNOSIS — L400 Psoriasis vulgaris: Secondary | ICD-10-CM | POA: Diagnosis not present

## 2023-06-28 DIAGNOSIS — J302 Other seasonal allergic rhinitis: Secondary | ICD-10-CM | POA: Diagnosis not present

## 2023-06-29 DIAGNOSIS — Z794 Long term (current) use of insulin: Secondary | ICD-10-CM | POA: Diagnosis not present

## 2023-06-29 DIAGNOSIS — H43391 Other vitreous opacities, right eye: Secondary | ICD-10-CM | POA: Diagnosis not present

## 2023-06-29 DIAGNOSIS — Z961 Presence of intraocular lens: Secondary | ICD-10-CM | POA: Diagnosis not present

## 2023-06-29 DIAGNOSIS — E119 Type 2 diabetes mellitus without complications: Secondary | ICD-10-CM | POA: Diagnosis not present

## 2023-07-02 ENCOUNTER — Encounter: Payer: Self-pay | Admitting: Hematology

## 2023-07-05 ENCOUNTER — Encounter: Payer: Self-pay | Admitting: Hematology

## 2023-07-06 ENCOUNTER — Inpatient Hospital Stay: Payer: HMO | Attending: Physician Assistant

## 2023-07-06 VITALS — BP 151/77 | HR 69 | Temp 97.6°F | Resp 16

## 2023-07-06 DIAGNOSIS — Z79899 Other long term (current) drug therapy: Secondary | ICD-10-CM | POA: Diagnosis not present

## 2023-07-06 DIAGNOSIS — D509 Iron deficiency anemia, unspecified: Secondary | ICD-10-CM | POA: Diagnosis not present

## 2023-07-06 MED ORDER — SODIUM CHLORIDE 0.9% FLUSH: 10.0000 mL | Freq: Two times a day (BID) | INTRAVENOUS | Status: DC

## 2023-07-06 MED ORDER — SODIUM CHLORIDE 0.9 % IV SOLN: INTRAVENOUS | Status: DC

## 2023-07-06 MED ORDER — SODIUM CHLORIDE 0.9 % IV SOLN
510.0000 mg | Freq: Once | INTRAVENOUS | Status: AC
  Administered 2023-07-06: 510 mg via INTRAVENOUS
  Filled 2023-07-06: qty 510

## 2023-07-06 NOTE — Patient Instructions (Signed)
 [facility] [place] - A DEPT OF [facility]  Discharge Instructions: Thank you for choosing [facility] to provide your oncology and hematology care.  If you have a lab appointment with the Cancer Center - please note that after April 8th, 2024, all labs will be drawn in the cancer center.  You do not have to check in or register with the main entrance as you have in the past but will complete your check-in in the cancer center.  Wear comfortable clothing and clothing appropriate for easy access to any Portacath or PICC line.   We strive to give you quality time with your provider. You may need to reschedule your appointment if you arrive late (15 or more minutes).  Arriving late affects you and other patients whose appointments are after yours.  Also, if you miss three or more appointments without notifying the office, you may be dismissed from the clinic at the provider's discretion.      For prescription refill requests, have your pharmacy contact our office and allow 72 hours for refills to be completed.    Today you received the following Feraheme iron infusion   To help prevent nausea and vomiting after your treatment, we encourage you to take your nausea medication as directed.  BELOW ARE SYMPTOMS THAT SHOULD BE REPORTED IMMEDIATELY: *FEVER GREATER THAN 100.4 F (38 C) OR HIGHER *CHILLS OR SWEATING *NAUSEA AND VOMITING THAT IS NOT CONTROLLED WITH YOUR NAUSEA MEDICATION *UNUSUAL SHORTNESS OF BREATH *UNUSUAL BRUISING OR BLEEDING *URINARY PROBLEMS (pain or burning when urinating, or frequent urination) *BOWEL PROBLEMS (unusual diarrhea, constipation, pain near the anus) TENDERNESS IN MOUTH AND THROAT WITH OR WITHOUT PRESENCE OF ULCERS (sore throat, sores in mouth, or a toothache) UNUSUAL RASH, SWELLING OR PAIN  UNUSUAL VAGINAL DISCHARGE OR ITCHING   Items with * indicate a potential emergency and should be followed up as soon as possible or go to  the Emergency Department if any problems should occur.  Please show the CHEMOTHERAPY ALERT CARD or IMMUNOTHERAPY ALERT CARD at check-in to the Emergency Department and triage nurse.  Should you have questions after your visit or need to cancel or reschedule your appointment, please contact [facility] [place] - A DEPT OF [facility] [phone]  and follow the prompts.  Office hours are 8:00 a.m. to 4:30 p.m. Monday - Friday. Please note that voicemails left after 4:00 p.m. may not be returned until the following business day.  We are closed weekends and major holidays. You have access to a nurse at all times for urgent questions. Please call the main number to the clinic [phone] and follow the prompts.  For any non-urgent questions, you may also contact your provider using MyChart. We now offer e-Visits for anyone 90 and older to request care online for non-urgent symptoms. For details visit [email or web address].   Also download the MyChart app! Go to the app store, search "MyChart", open the app, select [place], and log in with your MyChart username and password.

## 2023-07-06 NOTE — Progress Notes (Signed)
 Patient took pre-meds at home   Feraheme  iron  infusion given per orders. Patient tolerated it well without problems. Vitals stable and discharged home from clinic ambulatory. Follow up as scheduled.

## 2023-07-13 ENCOUNTER — Encounter: Payer: Self-pay | Admitting: Hematology

## 2023-07-20 ENCOUNTER — Encounter: Payer: Self-pay | Admitting: Podiatry

## 2023-07-20 ENCOUNTER — Encounter: Payer: Self-pay | Admitting: Hematology

## 2023-07-20 ENCOUNTER — Ambulatory Visit (INDEPENDENT_AMBULATORY_CARE_PROVIDER_SITE_OTHER): Payer: HMO | Admitting: Podiatry

## 2023-07-20 DIAGNOSIS — M79674 Pain in right toe(s): Secondary | ICD-10-CM

## 2023-07-20 DIAGNOSIS — M79675 Pain in left toe(s): Secondary | ICD-10-CM

## 2023-07-20 DIAGNOSIS — E1140 Type 2 diabetes mellitus with diabetic neuropathy, unspecified: Secondary | ICD-10-CM

## 2023-07-20 DIAGNOSIS — J302 Other seasonal allergic rhinitis: Secondary | ICD-10-CM | POA: Diagnosis not present

## 2023-07-20 DIAGNOSIS — Z87891 Personal history of nicotine dependence: Secondary | ICD-10-CM | POA: Diagnosis not present

## 2023-07-20 DIAGNOSIS — Z79899 Other long term (current) drug therapy: Secondary | ICD-10-CM | POA: Diagnosis not present

## 2023-07-20 DIAGNOSIS — Z6828 Body mass index (BMI) 28.0-28.9, adult: Secondary | ICD-10-CM | POA: Diagnosis not present

## 2023-07-20 DIAGNOSIS — I10 Essential (primary) hypertension: Secondary | ICD-10-CM | POA: Diagnosis not present

## 2023-07-20 DIAGNOSIS — E1149 Type 2 diabetes mellitus with other diabetic neurological complication: Secondary | ICD-10-CM

## 2023-07-20 DIAGNOSIS — B351 Tinea unguium: Secondary | ICD-10-CM | POA: Diagnosis not present

## 2023-07-20 DIAGNOSIS — Z713 Dietary counseling and surveillance: Secondary | ICD-10-CM | POA: Diagnosis not present

## 2023-07-20 NOTE — Progress Notes (Signed)
This patient returns to my office for at risk foot care.  This patient requires this care by a professional since this patient will be at risk due to having diabetes and PAD.   Marland KitchenThese nails are painful walking and wearing shoes.  He says he had his calluses treated last month. This patient presents for at risk foot care today.  General Appearance  Alert, conversant and in no acute stress.  Vascular  Dorsalis pedis and posterior tibial  pulses are palpable  bilaterally.  Capillary return is within normal limits  bilaterally. Temperature is within normal limits  bilaterally.  Neurologic  Senn-Weinstein monofilament wire test diminished bilaterally. Muscle power within normal limits bilaterally.  Nails Thick disfigured discolored nails with subungual debris  from hallux to fifth toes bilaterally. No evidence of bacterial infection or drainage bilaterally.  Orthopedic  No limitations of motion  feet .  No crepitus or effusions noted.  No bony pathology or digital deformities noted. DJD 1st  MPJ  B/L.  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    Skin  normotropic skin with no porokeratosis noted bilaterally.  No signs of infections or ulcers noted.     Asymptomatic porokeratosis sub 4th left  Onychomycosis  Pain in right toes  Pain in left toes   Consent was obtained for treatment procedures.   Mechanical debridement of nails 1-5  bilaterally performed with a nail nipper.  Filed with dremel without incident.     Return office visit   10 weeks                  Told patient to return for periodic foot care and evaluation due to potential at risk complications.   Helane Gunther DPM

## 2023-08-04 ENCOUNTER — Ambulatory Visit (HOSPITAL_COMMUNITY)
Admission: RE | Admit: 2023-08-04 | Discharge: 2023-08-04 | Disposition: A | Discharge status: Home / Self Care | Payer: HMO | Source: Ambulatory Visit | Attending: Cardiovascular Disease | Admitting: Cardiovascular Disease

## 2023-08-04 ENCOUNTER — Ambulatory Visit (HOSPITAL_BASED_OUTPATIENT_CLINIC_OR_DEPARTMENT_OTHER)
Admission: RE | Admit: 2023-08-04 | Discharge: 2023-08-04 | Disposition: A | Discharge status: Home / Self Care | Payer: HMO | Source: Ambulatory Visit | Attending: Cardiovascular Disease | Admitting: Cardiovascular Disease

## 2023-08-04 DIAGNOSIS — E782 Mixed hyperlipidemia: Secondary | ICD-10-CM | POA: Diagnosis not present

## 2023-08-04 DIAGNOSIS — I2583 Coronary atherosclerosis due to lipid rich plaque: Secondary | ICD-10-CM

## 2023-08-04 DIAGNOSIS — I6523 Occlusion and stenosis of bilateral carotid arteries: Secondary | ICD-10-CM | POA: Diagnosis not present

## 2023-08-04 DIAGNOSIS — I739 Peripheral vascular disease, unspecified: Secondary | ICD-10-CM | POA: Insufficient documentation

## 2023-08-04 DIAGNOSIS — I10 Essential (primary) hypertension: Secondary | ICD-10-CM

## 2023-08-04 DIAGNOSIS — R0989 Other specified symptoms and signs involving the circulatory and respiratory systems: Secondary | ICD-10-CM | POA: Insufficient documentation

## 2023-08-04 DIAGNOSIS — I2510 Atherosclerotic heart disease of native coronary artery without angina pectoris: Secondary | ICD-10-CM | POA: Insufficient documentation

## 2023-08-04 DIAGNOSIS — Z95820 Peripheral vascular angioplasty status with implants and grafts: Secondary | ICD-10-CM | POA: Insufficient documentation

## 2023-08-04 LAB — VAS US ABI WITH/WO TBI
Left ABI: 0.75
Right ABI: 0.95

## 2023-08-14 DIAGNOSIS — S40012A Contusion of left shoulder, initial encounter: Secondary | ICD-10-CM | POA: Diagnosis not present

## 2023-08-15 ENCOUNTER — Other Ambulatory Visit (HOSPITAL_COMMUNITY): Payer: Self-pay

## 2023-08-15 DIAGNOSIS — Z95828 Presence of other vascular implants and grafts: Secondary | ICD-10-CM

## 2023-08-15 DIAGNOSIS — I739 Peripheral vascular disease, unspecified: Secondary | ICD-10-CM

## 2023-08-15 DIAGNOSIS — I6523 Occlusion and stenosis of bilateral carotid arteries: Secondary | ICD-10-CM

## 2023-08-17 DIAGNOSIS — I10 Essential (primary) hypertension: Secondary | ICD-10-CM | POA: Diagnosis not present

## 2023-08-17 DIAGNOSIS — Z713 Dietary counseling and surveillance: Secondary | ICD-10-CM | POA: Diagnosis not present

## 2023-08-17 DIAGNOSIS — Z79899 Other long term (current) drug therapy: Secondary | ICD-10-CM | POA: Diagnosis not present

## 2023-08-17 DIAGNOSIS — Z87891 Personal history of nicotine dependence: Secondary | ICD-10-CM | POA: Diagnosis not present

## 2023-08-17 DIAGNOSIS — Z6827 Body mass index (BMI) 27.0-27.9, adult: Secondary | ICD-10-CM | POA: Diagnosis not present

## 2023-08-30 DIAGNOSIS — N401 Enlarged prostate with lower urinary tract symptoms: Secondary | ICD-10-CM | POA: Diagnosis not present

## 2023-09-06 DIAGNOSIS — N401 Enlarged prostate with lower urinary tract symptoms: Secondary | ICD-10-CM | POA: Diagnosis not present

## 2023-09-06 DIAGNOSIS — R3912 Poor urinary stream: Secondary | ICD-10-CM | POA: Diagnosis not present

## 2023-09-14 ENCOUNTER — Telehealth: Payer: Self-pay | Admitting: Cardiovascular Disease

## 2023-09-14 NOTE — Telephone Encounter (Signed)
 Follow Up:      Patient is calling, concerning a letter he received  about his Cholesterol.

## 2023-09-15 NOTE — Telephone Encounter (Signed)
Patient called again to follow-up on test results.

## 2023-09-15 NOTE — Telephone Encounter (Signed)
 Went over results and information.   Asked if he is still taking Atorvastatin 80 mg daily- he reports that he is still taking it. He gets plenty of exercise, but reports his diet was not been the greatest. Reports that he has made some dietary changes recently and has lost 5 pounds.   His PCP will do lab work 10/05/23 and will share results with Dr Allyson Sabal. Informed patient that I will give all of this information to Dr Allyson Sabal.

## 2023-09-28 ENCOUNTER — Ambulatory Visit (INDEPENDENT_AMBULATORY_CARE_PROVIDER_SITE_OTHER): Payer: HMO | Admitting: Podiatry

## 2023-09-28 ENCOUNTER — Encounter: Payer: Self-pay | Admitting: Podiatry

## 2023-09-28 DIAGNOSIS — E1149 Type 2 diabetes mellitus with other diabetic neurological complication: Secondary | ICD-10-CM

## 2023-09-28 DIAGNOSIS — M205X1 Other deformities of toe(s) (acquired), right foot: Secondary | ICD-10-CM

## 2023-09-28 DIAGNOSIS — B351 Tinea unguium: Secondary | ICD-10-CM

## 2023-09-28 DIAGNOSIS — M205X2 Other deformities of toe(s) (acquired), left foot: Secondary | ICD-10-CM

## 2023-09-28 DIAGNOSIS — M2041 Other hammer toe(s) (acquired), right foot: Secondary | ICD-10-CM

## 2023-09-28 DIAGNOSIS — M79674 Pain in right toe(s): Secondary | ICD-10-CM

## 2023-09-28 DIAGNOSIS — M2042 Other hammer toe(s) (acquired), left foot: Secondary | ICD-10-CM

## 2023-09-28 DIAGNOSIS — M79675 Pain in left toe(s): Secondary | ICD-10-CM

## 2023-09-28 DIAGNOSIS — E1140 Type 2 diabetes mellitus with diabetic neuropathy, unspecified: Secondary | ICD-10-CM

## 2023-09-28 NOTE — Progress Notes (Signed)
 This patient returns to my office for at risk foot care.  This patient requires this care by a professional since this patient will be at risk due to having diabetes and PAD.   Marland KitchenThese nails are painful walking and wearing shoes.  He says he had his calluses treated last month. This patient presents for at risk foot care today.  General Appearance  Alert, conversant and in no acute stress.  Vascular  Dorsalis pedis and posterior tibial  pulses are palpable  bilaterally.  Capillary return is within normal limits  bilaterally. Temperature is within normal limits  bilaterally.  Neurologic  Senn-Weinstein monofilament wire test diminished bilaterally. Muscle power within normal limits bilaterally.  Nails Thick disfigured discolored nails with subungual debris  from hallux to fifth toes bilaterally. No evidence of bacterial infection or drainage bilaterally.  Orthopedic  No limitations of motion  feet .  No crepitus or effusions noted.  No bony pathology or digital deformities noted. DJD 1st  MPJ  B/L.  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    Skin  normotropic skin with no porokeratosis noted bilaterally.  No signs of infections or ulcers noted.     Asymptomatic porokeratosis sub 4th left  Onychomycosis  Pain in right toes  Pain in left toes   Consent was obtained for treatment procedures.   Mechanical debridement of nails 1-5  bilaterally performed with a nail nipper.  Filed with dremel without incident.     Return office visit   10 weeks                  Told patient to return for periodic foot care and evaluation due to potential at risk complications.   Helane Gunther DPM

## 2023-10-05 DIAGNOSIS — D509 Iron deficiency anemia, unspecified: Secondary | ICD-10-CM | POA: Diagnosis not present

## 2023-10-05 DIAGNOSIS — E1169 Type 2 diabetes mellitus with other specified complication: Secondary | ICD-10-CM | POA: Diagnosis not present

## 2023-10-05 DIAGNOSIS — E782 Mixed hyperlipidemia: Secondary | ICD-10-CM | POA: Diagnosis not present

## 2023-10-06 LAB — LAB REPORT - SCANNED
A1c: 7.8
Albumin, Urine POC: 30.7
Albumin/Creatinine Ratio, Urine, POC: 29
Creatinine, POC: 105.8 mg/dL
EGFR: 97

## 2023-10-12 DIAGNOSIS — E1140 Type 2 diabetes mellitus with diabetic neuropathy, unspecified: Secondary | ICD-10-CM | POA: Diagnosis not present

## 2023-10-12 DIAGNOSIS — E782 Mixed hyperlipidemia: Secondary | ICD-10-CM | POA: Diagnosis not present

## 2023-10-12 DIAGNOSIS — L400 Psoriasis vulgaris: Secondary | ICD-10-CM | POA: Diagnosis not present

## 2023-10-12 DIAGNOSIS — E1151 Type 2 diabetes mellitus with diabetic peripheral angiopathy without gangrene: Secondary | ICD-10-CM | POA: Diagnosis not present

## 2023-10-12 DIAGNOSIS — G4710 Hypersomnia, unspecified: Secondary | ICD-10-CM | POA: Diagnosis not present

## 2023-10-12 DIAGNOSIS — F630 Pathological gambling: Secondary | ICD-10-CM | POA: Diagnosis not present

## 2023-10-12 DIAGNOSIS — I6523 Occlusion and stenosis of bilateral carotid arteries: Secondary | ICD-10-CM | POA: Diagnosis not present

## 2023-10-12 DIAGNOSIS — E1169 Type 2 diabetes mellitus with other specified complication: Secondary | ICD-10-CM | POA: Diagnosis not present

## 2023-10-12 DIAGNOSIS — E870 Hyperosmolality and hypernatremia: Secondary | ICD-10-CM | POA: Diagnosis not present

## 2023-10-12 DIAGNOSIS — N400 Enlarged prostate without lower urinary tract symptoms: Secondary | ICD-10-CM | POA: Diagnosis not present

## 2023-10-12 DIAGNOSIS — D509 Iron deficiency anemia, unspecified: Secondary | ICD-10-CM | POA: Diagnosis not present

## 2023-10-12 DIAGNOSIS — I10 Essential (primary) hypertension: Secondary | ICD-10-CM | POA: Diagnosis not present

## 2023-10-26 DIAGNOSIS — M9903 Segmental and somatic dysfunction of lumbar region: Secondary | ICD-10-CM | POA: Diagnosis not present

## 2023-10-26 DIAGNOSIS — M9905 Segmental and somatic dysfunction of pelvic region: Secondary | ICD-10-CM | POA: Diagnosis not present

## 2023-10-26 DIAGNOSIS — M62830 Muscle spasm of back: Secondary | ICD-10-CM | POA: Diagnosis not present

## 2023-10-26 DIAGNOSIS — M9902 Segmental and somatic dysfunction of thoracic region: Secondary | ICD-10-CM | POA: Diagnosis not present

## 2023-11-23 ENCOUNTER — Other Ambulatory Visit

## 2023-11-30 ENCOUNTER — Encounter: Payer: Self-pay | Admitting: Cardiovascular Disease

## 2023-11-30 ENCOUNTER — Ambulatory Visit: Payer: HMO | Attending: Cardiovascular Disease | Admitting: Cardiovascular Disease

## 2023-11-30 ENCOUNTER — Inpatient Hospital Stay: Payer: Medicare HMO | Attending: Physician Assistant

## 2023-11-30 VITALS — BP 142/78 | HR 63 | Ht 66.0 in | Wt 168.0 lb

## 2023-11-30 DIAGNOSIS — E785 Hyperlipidemia, unspecified: Secondary | ICD-10-CM | POA: Diagnosis not present

## 2023-11-30 DIAGNOSIS — G2581 Restless legs syndrome: Secondary | ICD-10-CM | POA: Insufficient documentation

## 2023-11-30 DIAGNOSIS — D509 Iron deficiency anemia, unspecified: Secondary | ICD-10-CM | POA: Diagnosis not present

## 2023-11-30 DIAGNOSIS — I10 Essential (primary) hypertension: Secondary | ICD-10-CM | POA: Insufficient documentation

## 2023-11-30 DIAGNOSIS — Z794 Long term (current) use of insulin: Secondary | ICD-10-CM | POA: Diagnosis not present

## 2023-11-30 DIAGNOSIS — I6523 Occlusion and stenosis of bilateral carotid arteries: Secondary | ICD-10-CM

## 2023-11-30 DIAGNOSIS — Z87891 Personal history of nicotine dependence: Secondary | ICD-10-CM | POA: Diagnosis not present

## 2023-11-30 DIAGNOSIS — G4730 Sleep apnea, unspecified: Secondary | ICD-10-CM | POA: Insufficient documentation

## 2023-11-30 DIAGNOSIS — I2583 Coronary atherosclerosis due to lipid rich plaque: Secondary | ICD-10-CM

## 2023-11-30 DIAGNOSIS — I739 Peripheral vascular disease, unspecified: Secondary | ICD-10-CM

## 2023-11-30 DIAGNOSIS — Z7985 Long-term (current) use of injectable non-insulin antidiabetic drugs: Secondary | ICD-10-CM | POA: Diagnosis not present

## 2023-11-30 DIAGNOSIS — Z79899 Other long term (current) drug therapy: Secondary | ICD-10-CM | POA: Insufficient documentation

## 2023-11-30 DIAGNOSIS — I2510 Atherosclerotic heart disease of native coronary artery without angina pectoris: Secondary | ICD-10-CM | POA: Diagnosis not present

## 2023-11-30 DIAGNOSIS — M1990 Unspecified osteoarthritis, unspecified site: Secondary | ICD-10-CM | POA: Insufficient documentation

## 2023-11-30 DIAGNOSIS — D500 Iron deficiency anemia secondary to blood loss (chronic): Secondary | ICD-10-CM

## 2023-11-30 DIAGNOSIS — E1151 Type 2 diabetes mellitus with diabetic peripheral angiopathy without gangrene: Secondary | ICD-10-CM | POA: Diagnosis not present

## 2023-11-30 DIAGNOSIS — E782 Mixed hyperlipidemia: Secondary | ICD-10-CM

## 2023-11-30 LAB — CBC WITH DIFFERENTIAL/PLATELET
Abs Immature Granulocytes: 0.04 10*3/uL (ref 0.00–0.07)
Basophils Absolute: 0.1 10*3/uL (ref 0.0–0.1)
Basophils Relative: 1 %
Eosinophils Absolute: 0.3 10*3/uL (ref 0.0–0.5)
Eosinophils Relative: 3 %
HCT: 44.4 % (ref 39.0–52.0)
Hemoglobin: 14.2 g/dL (ref 13.0–17.0)
Immature Granulocytes: 1 %
Lymphocytes Relative: 18 %
Lymphs Abs: 1.6 10*3/uL (ref 0.7–4.0)
MCH: 29 pg (ref 26.0–34.0)
MCHC: 32 g/dL (ref 30.0–36.0)
MCV: 90.8 fL (ref 80.0–100.0)
Monocytes Absolute: 0.8 10*3/uL (ref 0.1–1.0)
Monocytes Relative: 10 %
Neutro Abs: 5.8 10*3/uL (ref 1.7–7.7)
Neutrophils Relative %: 67 %
Platelets: 280 10*3/uL (ref 150–400)
RBC: 4.89 MIL/uL (ref 4.22–5.81)
RDW: 15.5 % (ref 11.5–15.5)
WBC: 8.6 10*3/uL (ref 4.0–10.5)
nRBC: 0 % (ref 0.0–0.2)

## 2023-11-30 LAB — IRON AND TIBC
Iron: 54 ug/dL (ref 45–182)
Saturation Ratios: 15 % — ABNORMAL LOW (ref 17.9–39.5)
TIBC: 366 ug/dL (ref 250–450)
UIBC: 312 ug/dL

## 2023-11-30 LAB — FERRITIN: Ferritin: 9 ng/mL — ABNORMAL LOW (ref 24–336)

## 2023-11-30 NOTE — Patient Instructions (Signed)
 Medication Instructions:  Your physician recommends that you continue on your current medications as directed. Please refer to the Current Medication list given to you today.  *If you need a refill on your cardiac medications before your next appointment, please call your pharmacy*   Testing/Procedures: Your physician has requested that you have an Aorta/Iliac Duplex. This will take place at [address] [facility], 4th floor  No food after 11PM the night before.  Water  is OK. (Don't drink liquids if you have been instructed not to for ANOTHER test) Avoid foods that produce bowel gas, for 24 hours prior to exam (see below). No breakfast, no chewing gum, no smoking or carbonated beverages. Patient may take morning medications with water . Come in for test at least 15 minutes early to register. **To be done in Februrary 2026**  Please note: We ask at that you not bring children with you during ultrasound (echo/ vascular) testing. Due to room size and safety concerns, children are not allowed in the ultrasound rooms during exams. Our front office staff cannot provide observation of children in our lobby area while testing is being conducted. An adult accompanying a patient to their appointment will only be allowed in the ultrasound room at the discretion of the ultrasound technician under special circumstances. We apologize for any inconvenience.  Your physician has requested that you have an ankle brachial index (ABI). During this test an ultrasound and blood pressure cuff are used to evaluate the arteries that supply the arms and legs with blood. Allow thirty minutes for this exam. There are no restrictions or special instructions. This will take place at [address], 4th floor   **To be done in Februrary 2026**     Please note: We ask at that you not bring children with you during ultrasound (echo/ vascular) testing. Due to room size and safety concerns, children are not allowed in the ultrasound  rooms during exams. Our front office staff cannot provide observation of children in our lobby area while testing is being conducted. An adult accompanying a patient to their appointment will only be allowed in the ultrasound room at the discretion of the ultrasound technician under special circumstances. We apologize for any inconvenience.  Your physician has requested that you have a carotid duplex. This test is an ultrasound of the carotid arteries in your neck. It looks at blood flow through these arteries that supply the brain with blood. Allow one hour for this exam. There are no restrictions or special instructions. This will take place at [address], 4th floor    **To be done in Februrary 2026**  Please note: We ask at that you not bring children with you during ultrasound (echo/ vascular) testing. Due to room size and safety concerns, children are not allowed in the ultrasound rooms during exams. Our front office staff cannot provide observation of children in our lobby area while testing is being conducted. An adult accompanying a patient to their appointment will only be allowed in the ultrasound room at the discretion of the ultrasound technician under special circumstances. We apologize for any inconvenience.   Follow-Up: At [facility], you and your health needs are our priority.  As part of our continuing mission to provide you with exceptional heart care, our providers are all part of one team.  This team includes your primary Cardiologist (physician) and Advanced Practice Providers or APPs (Physician Assistants and Nurse Practitioners) who all work together to provide you with the care you need,  when you need it.  Your next appointment:   12 month(s)  Provider:   Lauro Portal, MD    We recommend signing up for the patient portal called MyChart.  Sign up information is provided on this After Visit Summary.  MyChart is used to connect with patients for Virtual  Visits (Telemedicine).  Patients are able to view lab/test results, encounter notes, upcoming appointments, etc.  Non-urgent messages can be sent to your provider as well.   To learn more about what you can do with MyChart, go to [email or web address].

## 2023-11-30 NOTE — Assessment & Plan Note (Signed)
 History of PAD status post peripheral angiography which I performed 12/19/2017 revealing a 90% calcified ostial left common iliac artery stenosis with 50% right common iliac artery stenosis.  I performed diamondback orbital rotational atherectomy, PTA and covered stenting excellent result.  His Dopplers improved and his claudication resolved.  By duplex study 08/06/2023 his left iliac stent has remained patent.  Does have moderate right iliac disease but given his lack of claudication this will continue to be followed noninvasively.

## 2023-11-30 NOTE — Assessment & Plan Note (Signed)
 History of essential hypertension with blood pressure measured today at 142/78.  He is on amlodipine , and Ziac .

## 2023-11-30 NOTE — Assessment & Plan Note (Signed)
 History of hyperlipidemia on high-dose statin therapy with lipid profile performed/16/25 revealing total cholesterol 112, LDL 56 and HDL 32.

## 2023-11-30 NOTE — Assessment & Plan Note (Signed)
 History of CAD demonstrated by chest CT that showed three-vessel coronary calcification.  He has been asymptomatic however.

## 2023-11-30 NOTE — Progress Notes (Signed)
 11/30/2023 Luke [name]   08/06/1950  [id number]  Primary Physician Omie Bickers, MD Primary Cardiologist: Avanell Leigh MD FACP, [place], [place], [place]  HPI:  Luke [name] is a 73 y.o.   thin appearing married Caucasian male father of 2, grandfather of 4 grandchildren who I last saw in the office 11/25/2021.  He was referred by Viviann Gross, NP for peripheral vascular evaluation because of lifestyle limiting claudication.  I last saw him in the office 11/26/2022.  Apparently I took care of his wife Genevia Kern) Father [name] many years ago.  His risk factors include greater than 100 pack years of tobacco abuse having quit for year ago, treated hypertension, diabetes and hyperlipidemia.  He is never had a heart attack or stroke.  He denies chest pain or shortness of breath.  He does have documented coronary disease by Dr. Loetta Ringer years ago but was never intervened on.  He is complained of lower extremity claudication for the last 4 years which has gone undiagnosed and treated as a orthopedic issue.  Recent Dopplers performed in our office 12/12/2017 revealed a right ABI of 0.90 and left 0.51 with high-frequency signals in both iliac arteries.  I suspect this left common iliac is either occluded or or subtotally occluded.  He wishes to proceed with angiography and intervention.   I performed peripheral angiography on him 12/19/2017 revealing a 91% calcified ostial left common iliac artery stenosis and a 50% right.  I performed Dynabac orbital rotational atherectomy, PTA and covered stenting on him with an excellent angiographic and clinical result.  Follow-up Dopplers performed subsequent to that revealed increase in his left ABI from 0.51 up to 0.85.  His claudication has completely resolved.   Since I saw him in the office a year ago he continues to do well.  He does continue to have low iron  and require iron  infusions for unclear reasons followed by his PCP.  He denies chest pain or shortness of breath.   He has had a CT 01/05/2023 that has shown three-vessel coronary calcification.  He denies claudication now 6 years post left common iliac artery intervention with Dopplers performed 08/04/2023 revealing a patent left iliac stent with moderate right iliac disease.  He also has moderate bilateral internal carotid artery stenosis which were followed by duplex ultrasound.  He plays golf at least once a week without limitation.   Current Meds  Medication Sig   amLODipine  (NORVASC ) 10 MG tablet TAKE 1 TABLET EVERY DAY   aspirin  81 MG tablet Take 1 tablet (81 mg total) by mouth daily. HOLD FOR 1 WEEK, THEN RESUME IF NO BLEEDING PROBLEMS   atorvastatin  (LIPITOR) 80 MG tablet TAKE 1 TABLET EVERY DAY   bisoprolol -hydrochlorothiazide  (ZIAC ) 2.5-6.25 MG tablet    clobetasol (TEMOVATE) 0.05 % external solution Apply 1 application. topically at bedtime.   empagliflozin (JARDIANCE) 25 MG TABS tablet Take 25 mg by mouth daily.    etodolac (LODINE XL) 400 MG 24 hr tablet    FARXIGA 10 MG TABS tablet Take 1 tablet every day by oral route.   Ferrous Sulfate  Dried (HIGH POTENCY IRON ) 65 MG TABS    fexofenadine (ALLEGRA) 180 MG tablet Take 1 tablet every day by oral route.   finasteride  (PROSCAR ) 5 MG tablet Take 5 mg by mouth daily.   Insulin  Degludec-Liraglutide  (XULTOPHY ) 100-3.6 UNIT-MG/ML SOPN Inject 24 Units into the skin daily.    insulin  glargine (LANTUS  SOLOSTAR) 100 UNIT/ML Solostar Pen Inject 20 units by subcutaneous  route.   INVOKANA 300 MG TABS tablet    ketorolac  (TORADOL ) 10 MG tablet Take 10 mg by mouth 2 (two) times daily.   levocetirizine (XYZAL) 5 MG tablet levocetirizine 5 mg tablet   Loratadine  10 MG CAPS Claritin    metFORMIN (GLUCOPHAGE) 500 MG tablet Take 1,000 mg by mouth 2 (two) times daily.   Multiple Vitamins-Minerals (CENTRUM SILVER ULTRA MENS PO) Centrum Silver Ultra Men's   ONGLYZA 5 MG TABS tablet    pantoprazole  (PROTONIX ) 40 MG tablet TAKE 1 TABLET EVERY DAY   ramipril  (ALTACE ) 10  MG capsule Take 1 capsule (10 mg total) by mouth 2 (two) times daily.   rOPINIRole (REQUIP) 0.25 MG tablet    tamsulosin  (FLOMAX ) 0.4 MG CAPS capsule Take 0.4 mg by mouth.   vitamin B-12 (CYANOCOBALAMIN ) 100 MCG tablet Vitamin B-12     Allergies  Allergen Reactions   Morphine And Codeine Itching    Social History   Socioeconomic History   Marital status: Married    Spouse name: Not on file   Number of children: Not on file   Years of education: Not on file   Highest education level: Not on file  Occupational History   Not on file  Tobacco Use   Smoking status: Former    Current packs/day: 0.00    Average packs/day: 2.0 packs/day for 50.0 years (100.0 ttl pk-yrs)    Types: Cigarettes    Start date: 09/27/1964    Quit date: 09/28/2014    Years since quitting: 9.1   Smokeless tobacco: Never  Vaping Use   Vaping status: Never Used  Substance and Sexual Activity   Alcohol use: Never    Alcohol/week: 0.0 standard drinks of alcohol   Drug use: Never   Sexual activity: Not Currently  Other Topics Concern   Not on file  Social History Narrative   Not on file   Social Drivers of Health   Financial Resource Strain: Not on file  Food Insecurity: Not on file  Transportation Needs: Not on file  Physical Activity: Not on file  Stress: Not on file  Social Connections: Not on file  Intimate Partner Violence: Not on file     Review of Systems: General: negative for chills, fever, night sweats or weight changes.  Cardiovascular: negative for chest pain, dyspnea on exertion, edema, orthopnea, palpitations, paroxysmal nocturnal dyspnea or shortness of breath Dermatological: negative for rash Respiratory: negative for cough or wheezing Urologic: negative for hematuria Abdominal: negative for nausea, vomiting, diarrhea, bright red blood per rectum, melena, or hematemesis Neurologic: negative for visual changes, syncope, or dizziness All other systems reviewed and are otherwise  negative except as noted above.    Blood pressure (!) 142/78, pulse 63, height 5' 6 (1.676 m), weight 168 lb (76.2 kg), SpO2 93%.  General appearance: alert and no distress Neck: no adenopathy, no JVD, supple, symmetrical, trachea midline, thyroid  not enlarged, symmetric, no tenderness/mass/nodules, and soft bilateral carotid bruits. Lungs: clear to auscultation bilaterally Heart: regular rate and rhythm, S1, S2 normal, no murmur, click, rub or gallop Extremities: extremities normal, atraumatic, no cyanosis or edema Pulses: 2+ and symmetric Skin: Skin color, texture, turgor normal. No rashes or lesions Neurologic: Grossly normal  EKG EKG Interpretation Date/Time:  Wednesday November 30 2023 08:52:17 EDT Ventricular Rate:  63 PR Interval:  136 QRS Duration:  92 QT Interval:  408 QTC Calculation: 417 R Axis:   94  Text Interpretation: Sinus rhythm with marked sinus arrhythmia Rightward axis Incomplete right  bundle branch block When compared with ECG of 08-Jul-2017 01:41, PREVIOUS ECG IS PRESENT Confirmed by Lauro Portal [phone]) on 11/30/2023 8:57:27 AM    ASSESSMENT AND PLAN:   HTN (hypertension) History of essential hypertension with blood pressure measured today at 142/78.  He is on amlodipine , and Ziac .  Hyperlipemia History of hyperlipidemia on high-dose statin therapy with lipid profile performed/16/25 revealing total cholesterol 112, LDL 56 and HDL 32.  CAD (coronary artery disease) History of CAD demonstrated by chest CT that showed three-vessel coronary calcification.  He has been asymptomatic however.  Peripheral arterial disease (HCC) History of PAD status post peripheral angiography which I performed 12/19/2017 revealing a 90% calcified ostial left common iliac artery stenosis with 50% right common iliac artery stenosis.  I performed diamondback orbital rotational atherectomy, PTA and covered stenting excellent result.  His Dopplers improved and his claudication resolved.   By duplex study 08/06/2023 his left iliac stent has remained patent.  Does have moderate right iliac disease but given his lack of claudication this will continue to be followed noninvasively.  Carotid artery disease (HCC) Moderate bilateral ICA stenosis by duplex ultrasound last checked 08/04/2023.  This will be repeated this coming February.     Avanell Leigh MD FACP,FACC,FAHA, [facility] 11/30/2023 9:05 AM

## 2023-11-30 NOTE — Assessment & Plan Note (Signed)
 Moderate bilateral ICA stenosis by duplex ultrasound last checked 08/04/2023.  This will be repeated this coming February.

## 2023-12-03 DIAGNOSIS — E663 Overweight: Secondary | ICD-10-CM | POA: Diagnosis not present

## 2023-12-03 DIAGNOSIS — L0390 Cellulitis, unspecified: Secondary | ICD-10-CM | POA: Diagnosis not present

## 2023-12-03 DIAGNOSIS — Z6827 Body mass index (BMI) 27.0-27.9, adult: Secondary | ICD-10-CM | POA: Diagnosis not present

## 2023-12-06 NOTE — Progress Notes (Signed)
 [facility] [address], [place] [address]   CLINIC:  Medical Oncology/Hematology  PCP:  Omie Bickers, MD [address] [place] [address] [phone]   REASON FOR VISIT:  Follow-up for iron  deficiency anemia   PRIOR THERAPY: Oral iron    CURRENT THERAPY: Intermittent PRBC transfusions & IV iron  infusions   INTERVAL HISTORY:   Luke [name] 73 y.o. male returns for routine follow-up of  iron  deficiency anemia.  He was last seen by Sheril Dines PA-C on 06/01/2023.  He received IV Feraheme  x 1 on 07/06/2023.  At today's visit, he reports continuing to feeling very well.   He has some intermittent fatigue that comes and goes, which has previously improved with IV iron . He has some ice pica, but resists the urge to eat ice. He reports intermittent melanotic stool about once every 2 months, last was 2 weeks ago. No bright red blood per rectum or melena. He denies any chest pain, dyspnea on exertion, lightheadedness, or syncope. He has 100% energy and 100% appetite. He endorses that he is maintaining a stable weight.  ASSESSMENT & PLAN:  1.  Severe microcytic anemia / iron  deficiency anemia - Hematology work-up (09/30/2021) significant for severe iron  deficiency anemia.  Additional labs showed normal creatinine, copper , B12, methylmalonic acid, folate.  Normal SPEP and LDH.  Low reticulocytes for degree of anemia that was present. 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  Also required blood transfusion 7 years ago. - Last iron  with IV Feraheme  x 1 in January 2025 - He has some recurrent restless legs and fatigue.  No pica. - He reports melanotic stool about once a month.  Denies any gross hematochezia.   - Most recent labs (11/30/2023) Hgb 14.2/MCV 90.8.  Ferritin 9, iron  saturation 15%  with normal TIBC 366. - Most likely iron  deficiency anemia from chronic blood loss. - Follows with Dr. Nickey Barn of [facility].  Reportedly had EGD/colonoscopy on 10/14/2021, which were normal per patient report. - PLAN: No anemia, but he has significant iron  deficiency and is at risk for ongoing GI bleeding. - Recommend IV Feraheme  x 2 - Continue oral iron  supplement. - Labs in 6 months with OFFICE visit 1 week after   2.  Smoking history: - Patient has significant smoking history. - LDCT chest (11/13/2021): Lung RADS category 1S, negative for any signs of lung cancer, both signs of aortic atherosclerosis and left main and three-vessel CAD and findings suggestive of underlying COPD - LDCT chest (01/05/2023): Lung RADS 1S, negative.  Additional findings as described in report. - PLAN: Continue annual LDCT chest as part of LCS program - next due July 2025   3.  Social/family history: - Lives at home with his wife.  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  Smoked 2 packs/day for 50 years, quit in approximately January 07, 2019. - Brother died of lung cancer.  Patient grew up in foster home and does not know much of family history.   PLAN SUMMARY: >> IV Feraheme  x 2 >> LDCT chest due 01/06/2024  >> Labs in 6 months (CBC/D, ferritin, iron /TIBC) >> OFFICE visit 1 week after labs     REVIEW OF SYSTEMS:   Review of Systems  Constitutional:  Negative for appetite change, chills,  diaphoresis, fatigue, fever and unexpected weight change.  HENT:   Negative for lump/mass and nosebleeds.   Eyes:  Negative for eye problems.  Respiratory:  Negative for cough, hemoptysis and shortness of breath.   Cardiovascular:  Negative for chest pain, leg swelling and palpitations.  Gastrointestinal:  Positive for diarrhea (metformin). Negative for abdominal pain, blood in stool, constipation, nausea and vomiting.  Genitourinary:  Negative for hematuria.   Skin: Negative.    Neurological:  Positive for numbness. Negative for dizziness, headaches and light-headedness.  Hematological:  Does not bruise/bleed easily.     PHYSICAL EXAM:  ECOG PERFORMANCE STATUS: 0 - Asymptomatic  Vitals:   12/07/23 0806 12/07/23 0811  BP: (!) 157/66 (!) 155/72  Pulse: 71   Resp: 18   Temp: 97.9 F (36.6 C)   SpO2: 97%     Filed Weights   12/07/23 0806  Weight: 169 lb (76.7 kg)    Physical Exam Constitutional:      Appearance: Normal appearance. He is normal weight.   Cardiovascular:     Heart sounds: Normal heart sounds.  Pulmonary:     Breath sounds: Normal breath sounds.   Neurological:     General: No focal deficit present.     Mental Status: Mental status is at baseline.   Psychiatric:        Behavior: Behavior normal. Behavior is cooperative.     PAST MEDICAL/SURGICAL HISTORY:  Past Medical History:  Diagnosis Date   CAD (coronary artery disease) 10/02/2010   nuclear study show normal perfusion on medical therapy without scar or ischemia. 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   Sleep apnea    trial mask in the 1990s; haven't used one since (12/19/2017)   Type II diabetes mellitus (HCC)    Past Surgical History:  Procedure Laterality Date   ABDOMINAL AORTOGRAM W/LOWER EXTREMITY N/A 12/19/2017   Procedure: ABDOMINAL AORTOGRAM W/LOWER EXTREMITY;  Surgeon: Avanell Leigh, MD;  [facility]: [facility] INVASIVE CV LAB;  Service: Cardiovascular;  Laterality: N/A;   CARDIAC CATHETERIZATION  08/1999   which revealed a mild coronary artery disease with 60 and 70 and 80% stenosis in a small first diagonal vessel at the LAD, 50% mid  LAD stenosis, 50% ostial left circumflrx stenosis, and 40% ostial and proximal intermediate stenosis. he had 10 to 20% irregularities of his mid right coronary artery.   CHOLECYSTECTOMY N/A 07/08/2017   Procedure: LAPAROSCOPIC CHOLECYSTECTOMY;  Surgeon: Alanda Allegra, MD;  Location: [facility] ORS;  Service: General;  Laterality: N/A;   CIRCUMCISION  07/17/2012   Procedure: CIRCUMCISION ADULT;  Surgeon: Kristeen Peto, MD;  Location: [facility] ORS;  Service: Urology;  Laterality: N/A;   COLONOSCOPY  02/14/06   M JENKINS   COLONOSCOPY N/A 10/28/2014   Procedure: COLONOSCOPY;  Surgeon: Ruby Corporal, MD;  Location: [facility] ENDO SUITE;  Service: Endoscopy;  Laterality: N/A;  730   ENTEROSCOPY N/A 10/04/2014   Procedure: ENTEROSCOPY;  Surgeon: Alvis Jourdain, MD;  Location: [facility] ENDOSCOPY;  Service: Endoscopy;  Laterality: N/A;   ERCP  12/25/1996   ROURK   GIVENS CAPSULE STUDY N/A 10/04/2014  Procedure: GIVENS CAPSULE STUDY;  Surgeon: Alvis Jourdain, MD;  Location: [facility] ENDOSCOPY;  Service: Endoscopy;  Laterality: N/A;   GIVENS CAPSULE STUDY N/A 08/04/2018   Procedure: GIVENS CAPSULE STUDY;  Surgeon: Ruby Corporal, MD;  Location: [facility] ENDO SUITE;  Service: Endoscopy;  Laterality: N/A;  7:00am   JOINT REPLACEMENT     PERIPHERAL VASCULAR ATHERECTOMY Left 12/19/2017   Procedure: PERIPHERAL VASCULAR ATHERECTOMY;  Surgeon: Avanell Leigh, MD;  Location: [facility] INVASIVE CV LAB;  Service: Cardiovascular;  Laterality: Left;   PERIPHERAL VASCULAR INTERVENTION Left 12/19/2017   Procedure: PERIPHERAL VASCULAR INTERVENTION;  Surgeon: Avanell Leigh, MD;  Location: [facility] INVASIVE CV LAB;  Service: Cardiovascular;  Laterality: Left;  stent   SMALL BOWEL GIVENS  11/25/2008   TOTAL KNEE ARTHROPLASTY  2007-2008   UPPER GASTROINTESTINAL ENDOSCOPY  11/08/2008   EGD TCS    SOCIAL HISTORY:  Social History   Socioeconomic History   Marital status: Married    Spouse name: Not on file   Number of children: Not on file   Years of education: Not on  file   Highest education level: Not on file  Occupational History   Not on file  Tobacco Use   Smoking status: Former    Current packs/day: 0.00    Average packs/day: 2.0 packs/day for 50.0 years (100.0 ttl pk-yrs)    Types: Cigarettes    Start date: 09/27/1964    Quit date: 09/28/2014    Years since quitting: 9.1   Smokeless tobacco: Never  Vaping Use   Vaping status: Never Used  Substance and Sexual Activity   Alcohol use: Never    Alcohol/week: 0.0 standard drinks of alcohol   Drug use: Never   Sexual activity: Not Currently  Other Topics Concern   Not on file  Social History Narrative   Not on file   Social Drivers of Health   Financial Resource Strain: Not on file  Food Insecurity: Not on file  Transportation Needs: Not on file  Physical Activity: Not on file  Stress: Not on file  Social Connections: Not on file  Intimate Partner Violence: Not on file    FAMILY HISTORY:  Family History  Problem Relation Age of Onset   Healthy Sister    Healthy Brother    Healthy Sister    Healthy Brother    Healthy Brother    Healthy Daughter    Healthy Daughter     CURRENT MEDICATIONS:  Outpatient Encounter Medications as of 12/07/2023  Medication Sig Note   amLODipine  (NORVASC ) 10 MG tablet TAKE 1 TABLET EVERY DAY    aspirin  81 MG tablet Take 1 tablet (81 mg total) by mouth daily. HOLD FOR 1 WEEK, THEN RESUME IF NO BLEEDING PROBLEMS    atorvastatin  (LIPITOR) 80 MG tablet TAKE 1 TABLET EVERY DAY    bisoprolol -hydrochlorothiazide  (ZIAC ) 2.5-6.25 MG tablet     doxycycline (VIBRAMYCIN) 100 MG capsule Take 100 mg by mouth 2 (two) times daily.    empagliflozin (JARDIANCE) 25 MG TABS tablet Take 25 mg by mouth daily.     etodolac (LODINE XL) 400 MG 24 hr tablet     FARXIGA 10 MG TABS tablet Take 1 tablet every day by oral route.    Ferrous Sulfate  Dried (HIGH POTENCY IRON ) 65 MG TABS     fexofenadine (ALLEGRA) 180 MG tablet Take 1 tablet every day by oral route.    finasteride   (PROSCAR ) 5 MG tablet Take 5 mg by mouth daily.  Insulin  Degludec-Liraglutide  (XULTOPHY ) 100-3.6 UNIT-MG/ML SOPN Inject 24 Units into the skin daily.     insulin  glargine (LANTUS  SOLOSTAR) 100 UNIT/ML Solostar Pen Inject 20 units by subcutaneous route.    INVOKANA 300 MG TABS tablet     levocetirizine (XYZAL) 5 MG tablet levocetirizine 5 mg tablet    Loratadine  10 MG CAPS Claritin     metFORMIN (GLUCOPHAGE) 500 MG tablet Take 1,000 mg by mouth 2 (two) times daily.    metoprolol succinate (TOPROL-XL) 25 MG 24 hr tablet Take 1 tablet by mouth daily.    Multiple Vitamins-Minerals (CENTRUM SILVER ULTRA MENS PO) Centrum Silver Ultra Men's    mupirocin ointment (BACTROBAN) 2 % Apply topically 2 (two) times daily.    ONETOUCH VERIO test strip as directed.    ONGLYZA 5 MG TABS tablet     pantoprazole  (PROTONIX ) 40 MG tablet TAKE 1 TABLET EVERY DAY    rOPINIRole (REQUIP) 0.25 MG tablet  11/26/2022: Patient taking 2 caps daily morning and evening    tamsulosin  (FLOMAX ) 0.4 MG CAPS capsule Take 0.4 mg by mouth.    telmisartan (MICARDIS) 80 MG tablet Take 1 tablet by mouth daily.    vitamin B-12 (CYANOCOBALAMIN ) 100 MCG tablet Vitamin B-12    [DISCONTINUED] clobetasol (TEMOVATE) 0.05 % external solution Apply 1 application. topically at bedtime.    [DISCONTINUED] ketorolac  (TORADOL ) 10 MG tablet Take 10 mg by mouth 2 (two) times daily.    [DISCONTINUED] ramipril  (ALTACE ) 10 MG capsule Take 1 capsule (10 mg total) by mouth 2 (two) times daily.    No facility-administered encounter medications on file as of 12/07/2023.    ALLERGIES:  Allergies  Allergen Reactions   Morphine And Codeine Itching    LABORATORY DATA:  I have reviewed the labs as listed.  CBC    Component Value Date/Time   WBC 8.6 11/30/2023 0735   RBC 4.89 11/30/2023 0735   HGB 14.2 11/30/2023 0735   HGB 11.0 (L) 12/14/2017 1119   HCT 44.4 11/30/2023 0735   HCT 36.2 (L) 12/14/2017 1119   PLT 280 11/30/2023 0735   PLT 414  12/14/2017 1119   MCV 90.8 11/30/2023 0735   MCV 72 (L) 12/14/2017 1119   MCH 29.0 11/30/2023 0735   MCHC 32.0 11/30/2023 0735   RDW 15.5 11/30/2023 0735   RDW 18.3 (H) 12/14/2017 1119   LYMPHSABS 1.6 11/30/2023 0735   MONOABS 0.8 11/30/2023 0735   EOSABS 0.3 11/30/2023 0735   BASOSABS 0.1 11/30/2023 0735      Latest Ref Rng & Units 12/20/2017    2:23 AM 12/14/2017   11:19 AM 07/07/2017   10:32 PM  CMP  Glucose 70 - 99 mg/dL 657  846  962   BUN 8 - 23 mg/dL 8  10  12    Creatinine 0.61 - 1.24 mg/dL 9.52  8.41  3.24   Sodium 135 - 145 mmol/L 139  141  135   Potassium 3.5 - 5.1 mmol/L 3.6  4.7  3.5   Chloride 98 - 111 mmol/L 102  100  98   CO2 22 - 32 mmol/L 29  25  24    Calcium  8.9 - 10.3 mg/dL 8.3  40.1  9.2   Total Protein 6.5 - 8.1 g/dL   6.8   Total Bilirubin 0.3 - 1.2 mg/dL   0.4   Alkaline Phos 38 - 126 U/L   69   AST 15 - 41 U/L   20   ALT 17 - 63 U/L  17     DIAGNOSTIC IMAGING:  I have independently reviewed the relevant imaging and discussed with the patient.   WRAP UP:  All questions were answered. The patient knows to call the clinic with any problems, questions or concerns.  Medical decision making: Moderate  Time spent on visit: I spent 20 minutes counseling the patient face to face. The total time spent in the appointment was 30 minutes and more than 50% was on counseling.  Sonnie Dusky, PA-C  12/07/23 8:30 AM

## 2023-12-07 ENCOUNTER — Ambulatory Visit

## 2023-12-07 ENCOUNTER — Inpatient Hospital Stay: Payer: Medicare HMO | Admitting: Physician Assistant

## 2023-12-07 ENCOUNTER — Ambulatory Visit: Admitting: Podiatry

## 2023-12-07 ENCOUNTER — Encounter: Payer: Self-pay | Admitting: Podiatry

## 2023-12-07 DIAGNOSIS — Z87891 Personal history of nicotine dependence: Secondary | ICD-10-CM

## 2023-12-07 DIAGNOSIS — D509 Iron deficiency anemia, unspecified: Secondary | ICD-10-CM | POA: Diagnosis not present

## 2023-12-07 DIAGNOSIS — Z122 Encounter for screening for malignant neoplasm of respiratory organs: Secondary | ICD-10-CM

## 2023-12-07 DIAGNOSIS — E1140 Type 2 diabetes mellitus with diabetic neuropathy, unspecified: Secondary | ICD-10-CM

## 2023-12-07 DIAGNOSIS — M79675 Pain in left toe(s): Secondary | ICD-10-CM

## 2023-12-07 DIAGNOSIS — M2041 Other hammer toe(s) (acquired), right foot: Secondary | ICD-10-CM

## 2023-12-07 DIAGNOSIS — L84 Corns and callosities: Secondary | ICD-10-CM

## 2023-12-07 DIAGNOSIS — D500 Iron deficiency anemia secondary to blood loss (chronic): Secondary | ICD-10-CM

## 2023-12-07 DIAGNOSIS — E1149 Type 2 diabetes mellitus with other diabetic neurological complication: Secondary | ICD-10-CM

## 2023-12-07 DIAGNOSIS — M2141 Flat foot [pes planus] (acquired), right foot: Secondary | ICD-10-CM

## 2023-12-07 DIAGNOSIS — M2042 Other hammer toe(s) (acquired), left foot: Secondary | ICD-10-CM

## 2023-12-07 DIAGNOSIS — B351 Tinea unguium: Secondary | ICD-10-CM

## 2023-12-07 DIAGNOSIS — M79674 Pain in right toe(s): Secondary | ICD-10-CM

## 2023-12-07 NOTE — Patient Instructions (Signed)
 [facility] at [facility] **VISIT SUMMARY & IMPORTANT INSTRUCTIONS **   You were seen today by Sheril Dines PA-C for your follow-up visit.    IRON  DEFICIENCY ANEMIA Your blood levels look good.  You are not anemic at this time. However, your iron  levels have dropped lower than they were at your last appointment. We will schedule you for IV iron  x 2 doses. Continue taking iron  tablet daily. We will check your labs again in 6 months and schedule you for a office visit after those labs have been completed. Call our office if you have any symptoms of worsening fatigue, cravings for ice chips, or other symptoms concerning for low iron .  FOLLOW-UP APPOINTMENT: Labs in 6 months with office visit 1 week after.  ** Thank you for trusting me with your healthcare!  I strive to provide all of my patients with quality care at each visit.  If you receive a survey for this visit, I would be so grateful to you for taking the time to provide feedback.  Thank you in advance!  ~ Ashantee Deupree                   Dr. Paulett Boros   &   Sheril Dines, PA-C   - - - - - - - - - - - - - - - - - -    Thank you for choosing [facility] at [facility] to provide your oncology and hematology care.  To afford each patient quality time with our provider, please arrive at least 15 minutes before your scheduled appointment time.   If you have a lab appointment with the Cancer Center please come in thru the Main Entrance and check in at the main information desk.  You need to re-schedule your appointment should you arrive 10 or more minutes late.  We strive to give you quality time with our providers, and arriving late affects you and other patients whose appointments are after yours.  Also, if you no show three or more times for appointments you may be dismissed from the clinic at the providers discretion.     Again, thank you for choosing [facility].   Our hope is that these requests will decrease the amount of time that you wait before being seen by our physicians.       _____________________________________________________________  Should you have questions after your visit to [facility], please contact our office at [phone] and follow the prompts.  Our office hours are 8:00 a.m. and 4:30 p.m. Monday - Friday.  Please note that voicemails left after 4:00 p.m. may not be returned until the following business day.  We are closed weekends and major holidays.  You do have access to a nurse 24-7, just call the main number to the clinic [phone] and do not press any options, hold on the line and a nurse will answer the phone.    For prescription refill requests, have your pharmacy contact our office and allow 72 hours.

## 2023-12-07 NOTE — Progress Notes (Signed)
 Patient presents to the office today for diabetic shoe and insole measuring.  Patient was measured with brannock device to determine size and width for 1 pair of extra depth shoes and foam casted for 3 pair of insoles.   Documentation of medical necessity will be sent to patient's treating diabetic doctor to verify and sign.   Patient's diabetic provider: Denman Fischer  Shoes and insoles will be ordered at that time and patient will be notified for an appointment for fitting when they arrive. WILL NEED AUTH FROM [facility] ONCE PCP PPW IS RECVD    Shoe size (per patient): 8XWD Shoe choice:   [id number] / [id number] Shoe size ordered: 8XWD   ABN signed   Britton Cane CPed, CFo CFm

## 2023-12-07 NOTE — Progress Notes (Signed)
 This patient returns to my office for at risk foot care.  This patient requires this care by a professional since this patient will be at risk due to having diabetes and PAD.   Marland KitchenThese nails are painful walking and wearing shoes.  He says he had his calluses treated last month. This patient presents for at risk foot care today.  General Appearance  Alert, conversant and in no acute stress.  Vascular  Dorsalis pedis and posterior tibial  pulses are palpable  bilaterally.  Capillary return is within normal limits  bilaterally. Temperature is within normal limits  bilaterally.  Neurologic  Senn-Weinstein monofilament wire test diminished bilaterally. Muscle power within normal limits bilaterally.  Nails Thick disfigured discolored nails with subungual debris  from hallux to fifth toes bilaterally. No evidence of bacterial infection or drainage bilaterally.  Orthopedic  No limitations of motion  feet .  No crepitus or effusions noted.  No bony pathology or digital deformities noted. DJD 1st  MPJ  B/L.  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    Skin  normotropic skin with no porokeratosis noted bilaterally.  No signs of infections or ulcers noted.     Asymptomatic porokeratosis sub 4th left  Onychomycosis  Pain in right toes  Pain in left toes   Consent was obtained for treatment procedures.   Mechanical debridement of nails 1-5  bilaterally performed with a nail nipper.  Filed with dremel without incident.     Return office visit   10 weeks                  Told patient to return for periodic foot care and evaluation due to potential at risk complications.   Helane Gunther DPM

## 2023-12-08 ENCOUNTER — Ambulatory Visit: Admitting: Podiatry

## 2023-12-14 ENCOUNTER — Inpatient Hospital Stay

## 2023-12-14 VITALS — BP 120/59 | HR 69 | Temp 98.4°F | Resp 18

## 2023-12-14 DIAGNOSIS — D509 Iron deficiency anemia, unspecified: Secondary | ICD-10-CM | POA: Diagnosis not present

## 2023-12-14 MED ORDER — SODIUM CHLORIDE 0.9% FLUSH: 10.0000 mL | Freq: Two times a day (BID) | INTRAVENOUS | Status: DC

## 2023-12-14 MED ORDER — ALTEPLASE 2 MG IJ SOLR
2.0000 mg | Freq: Once | INTRAMUSCULAR | Status: DC | PRN
Start: 2023-12-14 — End: 2023-12-14

## 2023-12-14 MED ORDER — SODIUM CHLORIDE 0.9 % IV SOLN: INTRAVENOUS | Status: DC

## 2023-12-14 MED ORDER — HEPARIN SOD (PORK) LOCK FLUSH 100 UNIT/ML IV SOLN
250.0000 [IU] | Freq: Once | INTRAVENOUS | Status: DC | PRN
Start: 2023-12-14 — End: 2023-12-14

## 2023-12-14 MED ORDER — HEPARIN SOD (PORK) LOCK FLUSH 100 UNIT/ML IV SOLN
500.0000 [IU] | Freq: Once | INTRAVENOUS | Status: DC | PRN
Start: 2023-12-14 — End: 2023-12-14

## 2023-12-14 MED ORDER — SODIUM CHLORIDE 0.9 % IV SOLN
510.0000 mg | Freq: Once | INTRAVENOUS | Status: AC
  Administered 2023-12-14: 510 mg via INTRAVENOUS
  Filled 2023-12-14: qty 510

## 2023-12-14 MED ORDER — SODIUM CHLORIDE 0.9% FLUSH: 10.0000 mL | Freq: Once | INTRAVENOUS | Status: DC | PRN

## 2023-12-14 MED ORDER — SODIUM CHLORIDE 0.9% FLUSH: 3.0000 mL | Freq: Once | INTRAVENOUS | Status: DC | PRN

## 2023-12-14 NOTE — Progress Notes (Signed)
 Patient presents today for Feraheme  infusion per providers order.  Vital signs WNL.  Patient took premedications at home.  No new complaints at this time.  Peripheral IV started and blood return noted pre and post infusion.  Stable during infusion without adverse affects.  Vital signs stable.  Patient absolutely refuses to stay his 30 minute post infusion time.  Patient states that he is well aware of the risk associated with leaving before wait time complete.  No complaints at this time.  Discharge from clinic ambulatory in stable condition.  Alert and oriented X 3.  Follow up with [facility] as scheduled.

## 2023-12-14 NOTE — Patient Instructions (Signed)

## 2023-12-21 ENCOUNTER — Inpatient Hospital Stay: Attending: Physician Assistant

## 2023-12-21 VITALS — BP 136/61 | HR 60 | Temp 98.0°F | Resp 16

## 2023-12-21 DIAGNOSIS — D509 Iron deficiency anemia, unspecified: Secondary | ICD-10-CM | POA: Diagnosis not present

## 2023-12-21 MED ORDER — SODIUM CHLORIDE 0.9 % IV SOLN
510.0000 mg | Freq: Once | INTRAVENOUS | Status: AC
  Administered 2023-12-21: 510 mg via INTRAVENOUS
  Filled 2023-12-21: qty 510

## 2023-12-21 MED ORDER — SODIUM CHLORIDE 0.9 % IV SOLN: INTRAVENOUS | Status: DC

## 2023-12-21 MED ORDER — ACETAMINOPHEN 325 MG PO TABS
650.0000 mg | ORAL_TABLET | Freq: Once | ORAL | Status: DC
Start: 2023-12-21 — End: 2023-12-21

## 2023-12-21 MED ORDER — CETIRIZINE HCL 10 MG PO TABS
10.0000 mg | ORAL_TABLET | Freq: Once | ORAL | Status: DC
Start: 2023-12-21 — End: 2023-12-21

## 2023-12-21 MED ORDER — SODIUM CHLORIDE 0.9% FLUSH: 10.0000 mL | Freq: Two times a day (BID) | INTRAVENOUS | Status: DC

## 2023-12-21 NOTE — Patient Instructions (Signed)
 [facility] - A DEPT OF [facility]  Discharge Instructions: Thank you for choosing [facility] to provide your oncology and hematology care.  If you have a lab appointment with the Cancer Center - please note that after April 8th, 2024, all labs will be drawn in the cancer center.  You do not have to check in or register with the main entrance as you have in the past but will complete your check-in in the cancer center.  Wear comfortable clothing and clothing appropriate for easy access to any Portacath or PICC line.   We strive to give you quality time with your provider. You may need to reschedule your appointment if you arrive late (15 or more minutes).  Arriving late affects you and other patients whose appointments are after yours.  Also, if you miss three or more appointments without notifying the office, you may be dismissed from the clinic at the provider's discretion.      For prescription refill requests, have your pharmacy contact our office and allow 72 hours for refills to be completed.    Today you received the following iron  infusion: Fereheme   To help prevent nausea and vomiting after your treatment, we encourage you to take your nausea medication as directed.  BELOW ARE SYMPTOMS THAT SHOULD BE REPORTED IMMEDIATELY: *FEVER GREATER THAN 100.4 F (38 C) OR HIGHER *CHILLS OR SWEATING *NAUSEA AND VOMITING THAT IS NOT CONTROLLED WITH YOUR NAUSEA MEDICATION *UNUSUAL SHORTNESS OF BREATH *UNUSUAL BRUISING OR BLEEDING *URINARY PROBLEMS (pain or burning when urinating, or frequent urination) *BOWEL PROBLEMS (unusual diarrhea, constipation, pain near the anus) TENDERNESS IN MOUTH AND THROAT WITH OR WITHOUT PRESENCE OF ULCERS (sore throat, sores in mouth, or a toothache) UNUSUAL RASH, SWELLING OR PAIN  UNUSUAL VAGINAL DISCHARGE OR ITCHING   Items with * indicate a potential emergency and should be followed up as soon as possible or go to  the Emergency Department if any problems should occur.  Please show the CHEMOTHERAPY ALERT CARD or IMMUNOTHERAPY ALERT CARD at check-in to the Emergency Department and triage nurse.  Should you have questions after your visit or need to cancel or reschedule your appointment, please contact [facility] [place] - A DEPT OF [facility] [phone]  and follow the prompts.  Office hours are 8:00 a.m. to 4:30 p.m. Monday - Friday. Please note that voicemails left after 4:00 p.m. may not be returned until the following business day.  We are closed weekends and major holidays. You have access to a nurse at all times for urgent questions. Please call the main number to the clinic [phone] and follow the prompts.  For any non-urgent questions, you may also contact your provider using MyChart. We now offer e-Visits for anyone 24 and older to request care online for non-urgent symptoms. For details visit [email or web address].   Also download the MyChart app! Go to the app store, search MyChart, open the app, select [facility], and log in with your MyChart username and password.

## 2023-12-21 NOTE — Progress Notes (Signed)
 Patient took his own pre-meds this am.  Feraheme  iron  infusion given per orders. Patient tolerated it well without problems. Vitals stable and discharged home from clinic ambulatory. Follow up as scheduled.

## 2023-12-30 ENCOUNTER — Encounter (HOSPITAL_COMMUNITY)

## 2024-01-06 ENCOUNTER — Ambulatory Visit (HOSPITAL_COMMUNITY)
Admission: RE | Admit: 2024-01-06 | Discharge: 2024-01-06 | Disposition: A | Discharge status: Home / Self Care | Source: Ambulatory Visit | Attending: Physician Assistant | Admitting: Physician Assistant

## 2024-01-06 DIAGNOSIS — Z87891 Personal history of nicotine dependence: Secondary | ICD-10-CM | POA: Insufficient documentation

## 2024-01-06 DIAGNOSIS — Z122 Encounter for screening for malignant neoplasm of respiratory organs: Secondary | ICD-10-CM | POA: Insufficient documentation

## 2024-01-18 DIAGNOSIS — E1169 Type 2 diabetes mellitus with other specified complication: Secondary | ICD-10-CM | POA: Diagnosis not present

## 2024-01-18 DIAGNOSIS — E782 Mixed hyperlipidemia: Secondary | ICD-10-CM | POA: Diagnosis not present

## 2024-01-18 DIAGNOSIS — D509 Iron deficiency anemia, unspecified: Secondary | ICD-10-CM | POA: Diagnosis not present

## 2024-01-18 DIAGNOSIS — Z125 Encounter for screening for malignant neoplasm of prostate: Secondary | ICD-10-CM | POA: Diagnosis not present

## 2024-01-19 ENCOUNTER — Telehealth: Payer: Self-pay

## 2024-01-19 LAB — LAB REPORT - SCANNED
A1c: 6.7
Albumin, Urine POC: 23.1
Creatinine, POC: 121.2 mg/dL
EGFR: 93
Microalb Creat Ratio: 19

## 2024-01-19 NOTE — Telephone Encounter (Signed)
 Up to date on meds, I will review again in a week

## 2024-01-25 DIAGNOSIS — E1151 Type 2 diabetes mellitus with diabetic peripheral angiopathy without gangrene: Secondary | ICD-10-CM | POA: Diagnosis not present

## 2024-01-25 DIAGNOSIS — I6529 Occlusion and stenosis of unspecified carotid artery: Secondary | ICD-10-CM | POA: Diagnosis not present

## 2024-01-25 DIAGNOSIS — I2510 Atherosclerotic heart disease of native coronary artery without angina pectoris: Secondary | ICD-10-CM | POA: Diagnosis not present

## 2024-01-25 DIAGNOSIS — E782 Mixed hyperlipidemia: Secondary | ICD-10-CM | POA: Diagnosis not present

## 2024-01-25 DIAGNOSIS — L400 Psoriasis vulgaris: Secondary | ICD-10-CM | POA: Diagnosis not present

## 2024-01-25 DIAGNOSIS — E1159 Type 2 diabetes mellitus with other circulatory complications: Secondary | ICD-10-CM | POA: Diagnosis not present

## 2024-01-25 DIAGNOSIS — D509 Iron deficiency anemia, unspecified: Secondary | ICD-10-CM | POA: Diagnosis not present

## 2024-01-25 DIAGNOSIS — F630 Pathological gambling: Secondary | ICD-10-CM | POA: Diagnosis not present

## 2024-01-25 DIAGNOSIS — N400 Enlarged prostate without lower urinary tract symptoms: Secondary | ICD-10-CM | POA: Diagnosis not present

## 2024-01-25 DIAGNOSIS — E1169 Type 2 diabetes mellitus with other specified complication: Secondary | ICD-10-CM | POA: Diagnosis not present

## 2024-01-25 DIAGNOSIS — I10 Essential (primary) hypertension: Secondary | ICD-10-CM | POA: Diagnosis not present

## 2024-01-25 DIAGNOSIS — G4710 Hypersomnia, unspecified: Secondary | ICD-10-CM | POA: Diagnosis not present

## 2024-01-26 ENCOUNTER — Telehealth: Payer: Self-pay

## 2024-01-26 NOTE — Telephone Encounter (Signed)
 Up to date on meds, next review in September

## 2024-01-31 ENCOUNTER — Telehealth: Payer: Self-pay

## 2024-01-31 NOTE — Telephone Encounter (Signed)
 Refaxing DM shoe ppw

## 2024-02-08 DIAGNOSIS — H10503 Unspecified blepharoconjunctivitis, bilateral: Secondary | ICD-10-CM | POA: Diagnosis not present

## 2024-02-08 DIAGNOSIS — E119 Type 2 diabetes mellitus without complications: Secondary | ICD-10-CM | POA: Diagnosis not present

## 2024-02-08 DIAGNOSIS — H1045 Other chronic allergic conjunctivitis: Secondary | ICD-10-CM | POA: Diagnosis not present

## 2024-02-08 DIAGNOSIS — Z794 Long term (current) use of insulin: Secondary | ICD-10-CM | POA: Diagnosis not present

## 2024-02-08 DIAGNOSIS — H43391 Other vitreous opacities, right eye: Secondary | ICD-10-CM | POA: Diagnosis not present

## 2024-02-08 DIAGNOSIS — Z961 Presence of intraocular lens: Secondary | ICD-10-CM | POA: Diagnosis not present

## 2024-02-15 ENCOUNTER — Encounter: Payer: Self-pay | Admitting: Podiatry

## 2024-02-15 ENCOUNTER — Ambulatory Visit (INDEPENDENT_AMBULATORY_CARE_PROVIDER_SITE_OTHER): Admitting: Podiatry

## 2024-02-15 DIAGNOSIS — B351 Tinea unguium: Secondary | ICD-10-CM | POA: Diagnosis not present

## 2024-02-15 DIAGNOSIS — M79674 Pain in right toe(s): Secondary | ICD-10-CM | POA: Diagnosis not present

## 2024-02-15 DIAGNOSIS — M79675 Pain in left toe(s): Secondary | ICD-10-CM | POA: Diagnosis not present

## 2024-02-15 NOTE — Progress Notes (Signed)
 This patient returns to my office for at risk foot care.  This patient requires this care by a professional since this patient will be at risk due to having diabetes and PAD.   Marland KitchenThese nails are painful walking and wearing shoes.  He says he had his calluses treated last month. This patient presents for at risk foot care today.  General Appearance  Alert, conversant and in no acute stress.  Vascular  Dorsalis pedis and posterior tibial  pulses are palpable  bilaterally.  Capillary return is within normal limits  bilaterally. Temperature is within normal limits  bilaterally.  Neurologic  Senn-Weinstein monofilament wire test diminished bilaterally. Muscle power within normal limits bilaterally.  Nails Thick disfigured discolored nails with subungual debris  from hallux to fifth toes bilaterally. No evidence of bacterial infection or drainage bilaterally.  Orthopedic  No limitations of motion  feet .  No crepitus or effusions noted.  No bony pathology or digital deformities noted. DJD 1st  MPJ  B/L.  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    Skin  normotropic skin with no porokeratosis noted bilaterally.  No signs of infections or ulcers noted.     Asymptomatic porokeratosis sub 4th left  Onychomycosis  Pain in right toes  Pain in left toes   Consent was obtained for treatment procedures.   Mechanical debridement of nails 1-5  bilaterally performed with a nail nipper.  Filed with dremel without incident.     Return office visit   10 weeks                  Told patient to return for periodic foot care and evaluation due to potential at risk complications.   Helane Gunther DPM

## 2024-03-29 ENCOUNTER — Encounter: Payer: Self-pay | Admitting: Podiatry

## 2024-03-29 ENCOUNTER — Ambulatory Visit: Admitting: Podiatry

## 2024-03-29 DIAGNOSIS — E1149 Type 2 diabetes mellitus with other diabetic neurological complication: Secondary | ICD-10-CM | POA: Diagnosis not present

## 2024-03-29 DIAGNOSIS — M7751 Other enthesopathy of right foot: Secondary | ICD-10-CM

## 2024-03-29 DIAGNOSIS — E1140 Type 2 diabetes mellitus with diabetic neuropathy, unspecified: Secondary | ICD-10-CM

## 2024-03-29 DIAGNOSIS — L84 Corns and callosities: Secondary | ICD-10-CM | POA: Diagnosis not present

## 2024-03-29 MED ORDER — TRIAMCINOLONE ACETONIDE 10 MG/ML IJ SUSP
10.0000 mg | Freq: Once | INTRAMUSCULAR | Status: AC
  Administered 2024-03-29: 10 mg via INTRA_ARTICULAR

## 2024-03-29 NOTE — Progress Notes (Signed)
 Subjective:   Patient ID: Luke [name], male   DOB: 73 y.o.   MRN: [id number]   HPI Patient presents with several problems.  He has a lot of inflammation with fluid fourth digit right and has chronic lesion formation plantar aspect both feet around the fifth metatarsal that are hard to walk on.  Patient states that he does have diabetes and has had neurological issues with some loss of sensation and tingling burning   ROS      Objective:  Physical Exam  Neurovascular status indicates moderate vascular symptoms neurologically and has good circulatory status bilateral DP PT pulses.  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

## 2024-04-04 ENCOUNTER — Encounter (INDEPENDENT_AMBULATORY_CARE_PROVIDER_SITE_OTHER): Payer: Self-pay | Admitting: Gastroenterology

## 2024-04-13 DIAGNOSIS — Z23 Encounter for immunization: Secondary | ICD-10-CM | POA: Diagnosis not present

## 2024-04-25 ENCOUNTER — Ambulatory Visit: Admitting: Podiatry

## 2024-04-25 ENCOUNTER — Encounter: Payer: Self-pay | Admitting: Podiatry

## 2024-04-25 DIAGNOSIS — E1140 Type 2 diabetes mellitus with diabetic neuropathy, unspecified: Secondary | ICD-10-CM | POA: Diagnosis not present

## 2024-04-25 DIAGNOSIS — M79675 Pain in left toe(s): Secondary | ICD-10-CM | POA: Diagnosis not present

## 2024-04-25 DIAGNOSIS — B351 Tinea unguium: Secondary | ICD-10-CM

## 2024-04-25 DIAGNOSIS — E1149 Type 2 diabetes mellitus with other diabetic neurological complication: Secondary | ICD-10-CM

## 2024-04-25 DIAGNOSIS — M79674 Pain in right toe(s): Secondary | ICD-10-CM

## 2024-04-25 NOTE — Progress Notes (Signed)
 This patient returns to my office for at risk foot care.  This patient requires this care by a professional since this patient will be at risk due to having diabetes and PAD.   Marland KitchenThese nails are painful walking and wearing shoes.  He says he had his calluses treated last month. This patient presents for at risk foot care today.  General Appearance  Alert, conversant and in no acute stress.  Vascular  Dorsalis pedis and posterior tibial  pulses are palpable  bilaterally.  Capillary return is within normal limits  bilaterally. Temperature is within normal limits  bilaterally.  Neurologic  Senn-Weinstein monofilament wire test diminished bilaterally. Muscle power within normal limits bilaterally.  Nails Thick disfigured discolored nails with subungual debris  from hallux to fifth toes bilaterally. No evidence of bacterial infection or drainage bilaterally.  Orthopedic  No limitations of motion  feet .  No crepitus or effusions noted.  No bony pathology or digital deformities noted. DJD 1st  MPJ  B/L.  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    Skin  normotropic skin with no porokeratosis noted bilaterally.  No signs of infections or ulcers noted.     Asymptomatic porokeratosis sub 4th left  Onychomycosis  Pain in right toes  Pain in left toes   Consent was obtained for treatment procedures.   Mechanical debridement of nails 1-5  bilaterally performed with a nail nipper.  Filed with dremel without incident.     Return office visit   10 weeks                  Told patient to return for periodic foot care and evaluation due to potential at risk complications.   Helane Gunther DPM

## 2024-05-07 DIAGNOSIS — D509 Iron deficiency anemia, unspecified: Secondary | ICD-10-CM | POA: Diagnosis not present

## 2024-05-07 DIAGNOSIS — E1169 Type 2 diabetes mellitus with other specified complication: Secondary | ICD-10-CM | POA: Diagnosis not present

## 2024-05-07 DIAGNOSIS — E782 Mixed hyperlipidemia: Secondary | ICD-10-CM | POA: Diagnosis not present

## 2024-05-11 DIAGNOSIS — D509 Iron deficiency anemia, unspecified: Secondary | ICD-10-CM | POA: Diagnosis not present

## 2024-05-11 DIAGNOSIS — R809 Proteinuria, unspecified: Secondary | ICD-10-CM | POA: Diagnosis not present

## 2024-05-11 DIAGNOSIS — E1165 Type 2 diabetes mellitus with hyperglycemia: Secondary | ICD-10-CM | POA: Diagnosis not present

## 2024-05-11 DIAGNOSIS — Z0000 Encounter for general adult medical examination without abnormal findings: Secondary | ICD-10-CM | POA: Diagnosis not present

## 2024-05-11 DIAGNOSIS — E1140 Type 2 diabetes mellitus with diabetic neuropathy, unspecified: Secondary | ICD-10-CM | POA: Diagnosis not present

## 2024-05-11 DIAGNOSIS — E1169 Type 2 diabetes mellitus with other specified complication: Secondary | ICD-10-CM | POA: Diagnosis not present

## 2024-05-11 DIAGNOSIS — E782 Mixed hyperlipidemia: Secondary | ICD-10-CM | POA: Diagnosis not present

## 2024-05-11 DIAGNOSIS — Z0001 Encounter for general adult medical examination with abnormal findings: Secondary | ICD-10-CM | POA: Diagnosis not present

## 2024-05-11 DIAGNOSIS — F630 Pathological gambling: Secondary | ICD-10-CM | POA: Diagnosis not present

## 2024-05-11 DIAGNOSIS — I2510 Atherosclerotic heart disease of native coronary artery without angina pectoris: Secondary | ICD-10-CM | POA: Diagnosis not present

## 2024-05-11 DIAGNOSIS — Z23 Encounter for immunization: Secondary | ICD-10-CM | POA: Diagnosis not present

## 2024-05-11 DIAGNOSIS — I10 Essential (primary) hypertension: Secondary | ICD-10-CM | POA: Diagnosis not present

## 2024-05-28 ENCOUNTER — Encounter: Payer: Self-pay | Admitting: Oncology

## 2024-06-04 ENCOUNTER — Inpatient Hospital Stay: Attending: Physician Assistant

## 2024-06-04 DIAGNOSIS — D500 Iron deficiency anemia secondary to blood loss (chronic): Secondary | ICD-10-CM

## 2024-06-04 DIAGNOSIS — D509 Iron deficiency anemia, unspecified: Secondary | ICD-10-CM | POA: Diagnosis present

## 2024-06-04 LAB — CBC WITH DIFFERENTIAL/PLATELET
Abs Immature Granulocytes: 0.05 K/uL (ref 0.00–0.07)
Basophils Absolute: 0.1 K/uL (ref 0.0–0.1)
Basophils Relative: 1 %
Eosinophils Absolute: 0.2 K/uL (ref 0.0–0.5)
Eosinophils Relative: 2 %
HCT: 44.4 % (ref 39.0–52.0)
Hemoglobin: 13.9 g/dL (ref 13.0–17.0)
Immature Granulocytes: 1 %
Lymphocytes Relative: 15 %
Lymphs Abs: 1.3 K/uL (ref 0.7–4.0)
MCH: 28.4 pg (ref 26.0–34.0)
MCHC: 31.3 g/dL (ref 30.0–36.0)
MCV: 90.8 fL (ref 80.0–100.0)
Monocytes Absolute: 0.8 K/uL (ref 0.1–1.0)
Monocytes Relative: 9 %
Neutro Abs: 6.4 K/uL (ref 1.7–7.7)
Neutrophils Relative %: 72 %
Platelets: 285 K/uL (ref 150–400)
RBC: 4.89 MIL/uL (ref 4.22–5.81)
RDW: 15.1 % (ref 11.5–15.5)
WBC: 8.8 K/uL (ref 4.0–10.5)
nRBC: 0 % (ref 0.0–0.2)

## 2024-06-04 LAB — IRON AND TIBC
Iron: 71 ug/dL (ref 45–182)
Saturation Ratios: 19 % (ref 17.9–39.5)
TIBC: 371 ug/dL (ref 250–450)
UIBC: 300 ug/dL

## 2024-06-04 LAB — FERRITIN: Ferritin: 21 ng/mL — ABNORMAL LOW (ref 24–336)

## 2024-06-07 NOTE — Progress Notes (Unsigned)
 [facility] [address], [place] [address]   CLINIC:  Medical Oncology/Hematology  PCP:  Luke Norleen PEDLAR, MD [address] [place] [address] [phone]   REASON FOR VISIT:  Follow-up for iron  deficiency anemia   PRIOR THERAPY: Oral iron    CURRENT THERAPY: Intermittent PRBC transfusions & IV iron  infusions   INTERVAL HISTORY:   Luke [name] 73 y.o. male returns for routine follow-up of  iron  deficiency anemia.   He was last seen by Luke Barefoot PA-C on 12/07/2023. Most recent iron  with IV Feraheme  x 2 in June 2025  At today's visit, he reports continuing to feeling very well.*** He has 100***% energy and 100***% appetite.  ***He endorses that he is maintaining a stable weight.  He has some intermittent fatigue that comes and goes, which has previously improved with IV iron .*** He has some ice pica, but resists the urge to eat ice.*** He reports intermittent melanotic stool about once every 2 months, last was 2 weeks ago.*** No bright red blood per rectum or melena.  *** He denies any chest pain, dyspnea on exertion, lightheadedness, or syncope.***  ASSESSMENT & PLAN:  1.  Severe microcytic anemia / iron  deficiency anemia - Hematology work-up (09/30/2021) significant for severe iron  deficiency anemia.  Additional labs showed normal creatinine, copper , B12, methylmalonic acid, folate.  Normal SPEP and LDH.  Low reticulocytes for degree of anemia that was present. 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  Also required blood transfusion 7 years ago. - Last iron  with IV Feraheme  x 2 in June 2025 - He has some recurrent restless legs and fatigue.***  No pica.*** - He reports melanotic stool about once a month. *** Denies any gross hematochezia.  *** - Most recent labs (06/04/2024)  Hgb 13.9/MCV 90.8.  Ferritin 21, iron  saturation 19%. - Most likely iron  deficiency anemia from chronic blood loss.*** - Follows with [name] of [facility].  Reportedly had EGD/colonoscopy on 10/14/2021, which were normal per patient report.*** - PLAN: No anemia, but he has significant iron  deficiency and is at risk for ongoing GI bleeding.*** - Recommend IV Feraheme  x 2*** - Continue oral iron  supplement. - Labs in 6 months with OFFICE visit 1 week after   2.  Smoking history: - Patient has significant smoking history. - LDCT chest (11/13/2021): Lung RADS category 1S, negative for any signs of lung cancer, both signs of aortic atherosclerosis and left main and three-vessel CAD and findings suggestive of underlying COPD - LDCT chest (01/05/2023): Lung RADS 1S, negative.  Additional findings as described in report. - LDCT chest (01/06/2024): Lung RADS 1. - PLAN: Continue annual LDCT chest as part of LCS program - next due July 2026   3.  Social/family history: - Lives at home with his wife.  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  Smoked 2 packs/day for 50 years, quit in approximately 12-Jun-2019. - Brother died of lung cancer.  Patient grew up in foster home and does not know much of family history.   PLAN SUMMARY: >> IV Feraheme  x 2 >> Labs in 6 months (CBC/D, ferritin, iron /TIBC) >> OFFICE visit 1 week after labs     REVIEW OF SYSTEMS:***  Review of Systems  Constitutional:  Negative for appetite change,  chills, diaphoresis, fatigue, fever and unexpected weight change.  HENT:   Negative for lump/mass and nosebleeds.   Eyes:  Negative for eye problems.  Respiratory:  Negative for cough, hemoptysis and shortness of breath.   Cardiovascular:  Negative for chest pain, leg swelling and palpitations.  Gastrointestinal:  Positive for diarrhea (metformin). Negative for abdominal pain, blood in stool, constipation, nausea and vomiting.   Genitourinary:  Negative for hematuria.   Skin: Negative.   Neurological:  Positive for numbness. Negative for dizziness, headaches and light-headedness.  Hematological:  Does not bruise/bleed easily.     PHYSICAL EXAM:***  ECOG PERFORMANCE STATUS: 0 - Asymptomatic  There were no vitals filed for this visit.   There were no vitals filed for this visit.   Physical Exam Constitutional:      Appearance: Normal appearance. He is normal weight.  Cardiovascular:     Heart sounds: Normal heart sounds.  Pulmonary:     Breath sounds: Normal breath sounds.  Neurological:     General: No focal deficit present.     Mental Status: Mental status is at baseline.  Psychiatric:        Behavior: Behavior normal. Behavior is cooperative.     PAST MEDICAL/SURGICAL HISTORY:  Past Medical History:  Diagnosis Date   CAD (coronary artery disease) 10/02/2010   nuclear study show normal perfusion on medical therapy without scar or ischemia. 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   Sleep apnea    trial mask in the 1990s; haven't used one since (12/19/2017)   Type II diabetes mellitus (HCC)    Past Surgical History:  Procedure Laterality Date   ABDOMINAL AORTOGRAM W/LOWER EXTREMITY N/A 12/19/2017   Procedure: ABDOMINAL AORTOGRAM W/LOWER EXTREMITY;  Surgeon: Court Dorn PARAS, MD;  Location: [facility] INVASIVE CV LAB;  Service: Cardiovascular;  Laterality: N/A;   CARDIAC CATHETERIZATION  08/1999   which revealed a mild coronary artery disease with 60 and 70 and 80% stenosis in a small first diagonal vessel at the LAD, 50% mid LAD stenosis, 50% ostial left circumflrx stenosis, and  40% ostial and proximal intermediate stenosis. he had 10 to 20% irregularities of his mid right coronary artery.   CHOLECYSTECTOMY N/A 07/08/2017   Procedure: LAPAROSCOPIC CHOLECYSTECTOMY;  Surgeon: Mavis Anes, MD;  Location: [facility] ORS;  Service: General;  Laterality: N/A;   CIRCUMCISION  07/17/2012   Procedure: CIRCUMCISION ADULT;  Surgeon: Noretta Ferrara, MD;  Location: [facility] ORS;  Service: Urology;  Laterality: N/A;   COLONOSCOPY  02/14/06   M JENKINS   COLONOSCOPY N/A 10/28/2014   Procedure: COLONOSCOPY;  Surgeon: Claudis RAYMOND Rivet, MD;  Location: [facility] ENDO SUITE;  Service: Endoscopy;  Laterality: N/A;  730   ENTEROSCOPY N/A 10/04/2014   Procedure: ENTEROSCOPY;  Surgeon: Belvie Just, MD;  Location: [facility] ENDOSCOPY;  Service: Endoscopy;  Laterality: N/A;   ERCP  12/25/1996   ROURK   GIVENS CAPSULE STUDY N/A 10/04/2014   Procedure: GIVENS CAPSULE STUDY;  Surgeon: Belvie Just, MD;  Location: [facility] ENDOSCOPY;  Service: Endoscopy;  Laterality: N/A;   GIVENS CAPSULE STUDY N/A 08/04/2018   Procedure: GIVENS CAPSULE STUDY;  Surgeon: Golda Claudis PENNER, MD;  Location: [facility];  Service: Endoscopy;  Laterality: N/A;  7:00am   JOINT REPLACEMENT     PERIPHERAL VASCULAR ATHERECTOMY Left 12/19/2017   Procedure: PERIPHERAL VASCULAR ATHERECTOMY;  Surgeon: Court Dorn PARAS, MD;  Location: [facility] INVASIVE CV LAB;  Service: Cardiovascular;  Laterality: Left;   PERIPHERAL VASCULAR INTERVENTION Left 12/19/2017   Procedure: PERIPHERAL VASCULAR INTERVENTION;  Surgeon: Court Dorn PARAS, MD;  Location: [facility] INVASIVE CV LAB;  Service: Cardiovascular;  Laterality: Left;  stent   SMALL BOWEL GIVENS  11/25/2008   TOTAL KNEE ARTHROPLASTY  2007-2008   UPPER GASTROINTESTINAL ENDOSCOPY  11/08/2008   EGD TCS    SOCIAL HISTORY:  Social History   Socioeconomic History   Marital status: Married    Spouse name: Not on file   Number of children: Not on file   Years of education: Not on file   Highest education level: Not on file   Occupational History   Not on file  Tobacco Use   Smoking status: Former    Current packs/day: 0.00    Average packs/day: 2.0 packs/day for 50.0 years (100.0 ttl pk-yrs)    Types: Cigarettes    Start date: 09/27/1964    Quit date: 09/28/2014    Years since quitting: 9.6   Smokeless tobacco: Never  Vaping Use   Vaping status: Never Used  Substance and Sexual Activity   Alcohol use: Never    Alcohol/week: 0.0 standard drinks of alcohol   Drug use: Never   Sexual activity: Not Currently  Other Topics Concern   Not on file  Social History Narrative   Not on file   Social Drivers of Health   Tobacco Use: Medium Risk (04/25/2024)   Patient History    Smoking Tobacco Use: Former    Smokeless Tobacco Use: Never    Passive Exposure: Not on Actuary Strain: Not on file  Food Insecurity: Not on file  Transportation Needs: Not on file  Physical Activity: Not on file  Stress: Not on file  Social Connections: Not on file  Intimate Partner Violence: Not on file  Depression (PHQ2-9): Low Risk (12/21/2023)   Depression (PHQ2-9)    PHQ-2 Score: 0  Alcohol Screen: Not on file  Housing: Not on file  Utilities: Not on file  Health Literacy: Not on file    FAMILY HISTORY:  Family History  Problem Relation Age of Onset   Healthy Sister    Healthy Brother    Healthy Sister    Healthy Brother    Healthy Brother    Healthy Daughter    Healthy Daughter     CURRENT MEDICATIONS:  Outpatient Encounter Medications as of 06/11/2024  Medication Sig Note   amLODipine  (NORVASC ) 10 MG tablet TAKE 1 TABLET EVERY DAY    aspirin  81 MG tablet Take 1 tablet (81 mg total) by mouth daily. HOLD FOR 1 WEEK, THEN RESUME IF NO BLEEDING PROBLEMS    atorvastatin  (LIPITOR) 80 MG tablet TAKE 1 TABLET EVERY DAY    bisoprolol -hydrochlorothiazide  (ZIAC ) 2.5-6.25 MG tablet     doxycycline (VIBRAMYCIN) 100 MG capsule Take 100 mg by mouth 2 (two) times daily.    empagliflozin (JARDIANCE) 25 MG  TABS tablet Take 25 mg by mouth daily.     etodolac (LODINE XL) 400 MG 24 hr tablet     FARXIGA 10 MG TABS tablet Take 1 tablet every day by oral route.    Ferrous Sulfate  Dried (HIGH POTENCY IRON )  65 MG TABS     fexofenadine (ALLEGRA) 180 MG tablet Take 1 tablet every day by oral route.    finasteride  (PROSCAR ) 5 MG tablet Take 5 mg by mouth daily.    Insulin  Degludec-Liraglutide  (XULTOPHY ) 100-3.6 UNIT-MG/ML SOPN Inject 24 Units into the skin daily.     insulin  glargine (LANTUS  SOLOSTAR) 100 UNIT/ML Solostar Pen Inject 20 units by subcutaneous route.    INVOKANA 300 MG TABS tablet     levocetirizine (XYZAL) 5 MG tablet levocetirizine 5 mg tablet    Loratadine  10 MG CAPS Claritin     metFORMIN (GLUCOPHAGE) 500 MG tablet Take 1,000 mg by mouth 2 (two) times daily.    metoprolol succinate (TOPROL-XL) 25 MG 24 hr tablet Take 1 tablet by mouth daily.    Multiple Vitamins-Minerals (CENTRUM SILVER ULTRA MENS PO) Centrum Silver Ultra Men's    mupirocin ointment (BACTROBAN) 2 % Apply topically 2 (two) times daily.    ONETOUCH VERIO test strip as directed.    ONGLYZA 5 MG TABS tablet     pantoprazole  (PROTONIX ) 40 MG tablet TAKE 1 TABLET EVERY DAY    rOPINIRole (REQUIP) 0.25 MG tablet  11/26/2022: Patient taking 2 caps daily morning and evening    tamsulosin  (FLOMAX ) 0.4 MG CAPS capsule Take 0.4 mg by mouth.    telmisartan (MICARDIS) 80 MG tablet Take 1 tablet by mouth daily.    vitamin B-12 (CYANOCOBALAMIN ) 100 MCG tablet Vitamin B-12    No facility-administered encounter medications on file as of 06/11/2024.    ALLERGIES:  Allergies  Allergen Reactions   Morphine And Codeine Itching    LABORATORY DATA:  I have reviewed the labs as listed.  CBC    Component Value Date/Time   WBC 8.8 06/04/2024 0741   RBC 4.89 06/04/2024 0741   HGB 13.9 06/04/2024 0741   HGB 11.0 (L) 12/14/2017 1119   HCT 44.4 06/04/2024 0741   HCT 36.2 (L) 12/14/2017 1119   PLT 285 06/04/2024 0741   PLT 414  12/14/2017 1119   MCV 90.8 06/04/2024 0741   MCV 72 (L) 12/14/2017 1119   MCH 28.4 06/04/2024 0741   MCHC 31.3 06/04/2024 0741   RDW 15.1 06/04/2024 0741   RDW 18.3 (H) 12/14/2017 1119   LYMPHSABS 1.3 06/04/2024 0741   MONOABS 0.8 06/04/2024 0741   EOSABS 0.2 06/04/2024 0741   BASOSABS 0.1 06/04/2024 0741      Latest Ref Rng & Units 12/20/2017    2:23 AM 12/14/2017   11:19 AM 07/07/2017   10:32 PM  CMP  Glucose 70 - 99 mg/dL 886  824  734   BUN 8 - 23 mg/dL 8  10  12    Creatinine 0.61 - 1.24 mg/dL 9.17  9.19  9.10   Sodium 135 - 145 mmol/L 139  141  135   Potassium 3.5 - 5.1 mmol/L 3.6  4.7  3.5   Chloride 98 - 111 mmol/L 102  100  98   CO2 22 - 32 mmol/L 29  25  24    Calcium  8.9 - 10.3 mg/dL 8.3  89.8  9.2   Total Protein 6.5 - 8.1 g/dL   6.8   Total Bilirubin 0.3 - 1.2 mg/dL   0.4   Alkaline Phos 38 - 126 U/L   69   AST 15 - 41 U/L   20   ALT 17 - 63 U/L   17     DIAGNOSTIC IMAGING:  I have independently reviewed the relevant imaging and discussed  with the patient.   WRAP UP:  All questions were answered. The patient knows to call the clinic with any problems, questions or concerns.  Medical decision making: Moderate  Time spent on visit: I spent 20 minutes counseling the patient face to face. The total time spent in the appointment was 30 minutes and more than 50% was on counseling.  Luke CHRISTELLA Barefoot, PA-C  ***

## 2024-06-11 ENCOUNTER — Inpatient Hospital Stay: Admitting: Physician Assistant

## 2024-06-11 DIAGNOSIS — D509 Iron deficiency anemia, unspecified: Secondary | ICD-10-CM | POA: Diagnosis not present

## 2024-06-11 DIAGNOSIS — D500 Iron deficiency anemia secondary to blood loss (chronic): Secondary | ICD-10-CM | POA: Diagnosis not present

## 2024-06-11 NOTE — Patient Instructions (Signed)
 [facility] at [facility] **VISIT SUMMARY & IMPORTANT INSTRUCTIONS **   You were seen today by [name] for your follow-up visit.    IRON  DEFICIENCY ANEMIA Your blood levels look good.  You are not anemic at this time. However, your iron  levels have are still low.   We will schedule you for IV iron  x 1 dose. Continue taking iron  tablet ONCE daily. We will check your labs again in 6 months and schedule you for a office visit after those labs have been completed. Call our office if you have any symptoms of worsening fatigue, cravings for ice chips, or other symptoms concerning for low iron .  FOLLOW-UP APPOINTMENT: Labs in 6 months with office visit 1 week after.  ** Thank you for trusting me with your healthcare!  I strive to provide all of my patients with quality care at each visit.  If you receive a survey for this visit, I would be so grateful to you for taking the time to provide feedback.  Thank you in advance!  ~ Telena Peyser                                        Dr. Mickiel Davonna Pleasant Barefoot, PA-C      Delon Hope, NP   - - - - - - - - - - - - - - - - - -     Thank you for choosing [facility] at [facility] to provide your oncology and hematology care.  To afford each patient quality time with our provider, please arrive at least 15 minutes before your scheduled appointment time.   If you have a lab appointment with the Cancer Center please come in thru the Main Entrance and check in at the main information desk.  You need to re-schedule your appointment should you arrive 10 or more minutes late.  We strive to give you quality time with our providers, and arriving late affects you and other patients whose appointments are after yours.  Also, if you no show three or more times for appointments you may be dismissed from the clinic at the providers discretion.     Again, thank you for choosing [facility].  Our hope is that these requests will decrease the amount of time that you wait before being seen by our physicians.       _____________________________________________________________  Should you have questions after your visit to [facility], please contact our office at [phone] and follow the prompts.  Our office hours are 8:00 a.m. and 4:30 p.m. Monday - Friday.  Please note that voicemails left after 4:00 p.m. may not be returned until the following business day.  We are closed weekends and major holidays.  You do have access to a nurse 24-7, just call the main number to the clinic [phone] and do not press any options, hold on the line and a nurse will answer the phone.    For prescription refill requests, have your pharmacy contact our office and allow 72 hours.

## 2024-06-18 ENCOUNTER — Encounter: Payer: Self-pay | Admitting: *Deleted

## 2024-06-19 ENCOUNTER — Encounter: Payer: Self-pay | Admitting: Oncology

## 2024-06-22 ENCOUNTER — Inpatient Hospital Stay: Attending: Physician Assistant

## 2024-06-22 VITALS — BP 155/63 | HR 54 | Temp 97.9°F | Resp 17

## 2024-06-22 DIAGNOSIS — D509 Iron deficiency anemia, unspecified: Secondary | ICD-10-CM

## 2024-06-22 MED ORDER — SODIUM CHLORIDE 0.9% FLUSH: 10.0000 mL | Freq: Two times a day (BID) | INTRAVENOUS | Status: DC

## 2024-06-22 MED ORDER — SODIUM CHLORIDE 0.9 % IV SOLN: INTRAVENOUS | Status: DC

## 2024-06-22 MED ORDER — SODIUM CHLORIDE 0.9 % IV SOLN
510.0000 mg | Freq: Once | INTRAVENOUS | Status: AC
  Administered 2024-06-22: 510 mg via INTRAVENOUS
  Filled 2024-06-22: qty 510

## 2024-06-22 NOTE — Progress Notes (Signed)
 Patient does not want to wait his wait time today.   Feraheme  iron  infusion given per orders. Patient tolerated it well without problems. Vitals stable and discharged home from clinic ambulatory. Follow up as scheduled.

## 2024-06-22 NOTE — Progress Notes (Signed)
 Patient presents today for Feraheme  infusion. Patient took pre-medications prior to arrival at 08:30. Tylenol  650 mg and Cetirizine  10 mg PO. Patient denies any side effects related to last iron  infusion.

## 2024-06-22 NOTE — Patient Instructions (Addendum)
 [facility] [place] - A DEPT OF . [facility]  Discharge Instructions: Thank you for choosing [facility] to provide your oncology and hematology care.  If you have a lab appointment with the Cancer Center - please note that after April 8th, 2024, all labs will be drawn in the cancer center.  You do not have to check in or register with the main entrance as you have in the past but will complete your check-in in the cancer center.    We strive to give you quality time with your provider. You may need to reschedule your appointment if you arrive late (15 or more minutes).  Arriving late affects you and other patients whose appointments are after yours.  Also, if you miss three or more appointments without notifying the office, you may be dismissed from the clinic at the providers discretion.      For prescription refill requests, have your pharmacy contact our office and allow 72 hours for refills to be completed.    Today you received the following chemotherapy and/or immunotherapy agents Feraheme .  Ferumoxytol  Injection What is this medication? FERUMOXYTOL  (FER ue MOX i tol) treats low levels of iron  in your body (iron  deficiency anemia). Iron  is a mineral that plays an important role in making red blood cells, which carry oxygen from your lungs to the rest of your body. This medicine may be used for other purposes; ask your health care provider or pharmacist if you have questions. COMMON BRAND NAME(S): Feraheme  What should I tell my care team before I take this medication? They need to know if you have any of these conditions: Anemia not caused by low iron  levels High levels of iron  in the blood Magnetic resonance imaging (MRI) test scheduled An unusual or allergic reaction to iron , other medications, foods, dyes, or preservatives Pregnant or trying to get pregnant Breastfeeding How should I use this medication? This medication is injected into a vein.  It is given by your care team in a hospital or clinic setting. Talk to your care team the use of this medication in children. Special care may be needed. Overdosage: If you think you have taken too much of this medicine contact a poison control center or emergency room at once. NOTE: This medicine is only for you. Do not share this medicine with others. What if I miss a dose? It is important not to miss your dose. Call your care team if you are unable to keep an appointment. What may interact with this medication? Other iron  products This list may not describe all possible interactions. Give your health care provider a list of all the medicines, herbs, non-prescription drugs, or dietary supplements you use. Also tell them if you smoke, drink alcohol, or use illegal drugs. Some items may interact with your medicine. What should I watch for while using this medication? Visit your care team for regular checks on your progress. Tell your care team if your symptoms do not start to get better or if they get worse. You may need blood work done while you are taking this medication. You may need to eat more foods that contain iron . Talk to your care team. Foods that contain iron  include whole grains or cereals, dried fruits, beans, peas, leafy green vegetables, and organ meats (liver, kidney). What side effects may I notice from receiving this medication? Side effects that you should report to your care team as soon as possible: Allergic reactions--skin rash,  itching, hives, swelling of the face, lips, tongue, or throat Low blood pressure--dizziness, feeling faint or lightheaded, blurry vision Shortness of breath Side effects that usually do not require medical attention (report to your care team if they continue or are bothersome): Flushing Headache Joint pain Muscle pain Nausea Pain, redness, or irritation at injection site This list may not describe all possible side effects. Call your doctor for  medical advice about side effects. You may report side effects to FDA at 1-800-FDA-1088. Where should I keep my medication? This medication is given in a hospital or clinic. It will not be stored at home. NOTE: This sheet is a summary. It may not cover all possible information. If you have questions about this medicine, talk to your doctor, pharmacist, or health care provider.  2024 Elsevier/Gold Standard (2023-01-26 00:00:00)      To help prevent nausea and vomiting after your treatment, we encourage you to take your nausea medication as directed.  BELOW ARE SYMPTOMS THAT SHOULD BE REPORTED IMMEDIATELY: *FEVER GREATER THAN 100.4 F (38 C) OR HIGHER *CHILLS OR SWEATING *NAUSEA AND VOMITING THAT IS NOT CONTROLLED WITH YOUR NAUSEA MEDICATION *UNUSUAL SHORTNESS OF BREATH *UNUSUAL BRUISING OR BLEEDING *URINARY PROBLEMS (pain or burning when urinating, or frequent urination) *BOWEL PROBLEMS (unusual diarrhea, constipation, pain near the anus) TENDERNESS IN MOUTH AND THROAT WITH OR WITHOUT PRESENCE OF ULCERS (sore throat, sores in mouth, or a toothache) UNUSUAL RASH, SWELLING OR PAIN  UNUSUAL VAGINAL DISCHARGE OR ITCHING   Items with * indicate a potential emergency and should be followed up as soon as possible or go to the Emergency Department if any problems should occur.  Please show the CHEMOTHERAPY ALERT CARD or IMMUNOTHERAPY ALERT CARD at check-in to the Emergency Department and triage nurse.  Should you have questions after your visit or need to cancel or reschedule your appointment, please contact [facility] [place] - A DEPT OF [facility] [phone]  and follow the prompts.  Office hours are 8:00 a.m. to 4:30 p.m. Monday - Friday. Please note that voicemails left after 4:00 p.m. may not be returned until the following business day.  We are closed weekends and major holidays. You have access to a nurse at all times for urgent questions. Please call the main  number to the clinic [phone] and follow the prompts.  For any non-urgent questions, you may also contact your provider using MyChart. We now offer e-Visits for anyone 25 and older to request care online for non-urgent symptoms. For details visit [email or web address].   Also download the MyChart app! Go to the app store, search MyChart, open the app, select [place], and log in with your MyChart username and password.

## 2024-06-27 ENCOUNTER — Encounter: Payer: Self-pay | Admitting: *Deleted

## 2024-06-27 NOTE — Progress Notes (Signed)
 Luke [name]                                          MRN: [id number]   06/27/2024   The [facility] Quality Team Specialist reviewed this patient medical record for the purposes of chart review for care gap closure. The following were reviewed: abstraction for care gap closure-controlling blood pressure.    [facility] Quality Team

## 2024-07-03 ENCOUNTER — Encounter: Payer: Self-pay | Admitting: Oncology

## 2024-07-04 ENCOUNTER — Encounter: Payer: Self-pay | Admitting: Podiatry

## 2024-07-04 ENCOUNTER — Ambulatory Visit: Payer: Self-pay | Admitting: Podiatry

## 2024-07-04 DIAGNOSIS — E1140 Type 2 diabetes mellitus with diabetic neuropathy, unspecified: Secondary | ICD-10-CM | POA: Diagnosis not present

## 2024-07-04 DIAGNOSIS — M79674 Pain in right toe(s): Secondary | ICD-10-CM | POA: Diagnosis not present

## 2024-07-04 DIAGNOSIS — E1149 Type 2 diabetes mellitus with other diabetic neurological complication: Secondary | ICD-10-CM | POA: Diagnosis not present

## 2024-07-04 DIAGNOSIS — B351 Tinea unguium: Secondary | ICD-10-CM | POA: Diagnosis not present

## 2024-07-04 DIAGNOSIS — M79675 Pain in left toe(s): Secondary | ICD-10-CM

## 2024-07-04 NOTE — Progress Notes (Signed)
 This patient returns to my office for at risk foot care.  This patient requires this care by a professional since this patient will be at risk due to having diabetes and PAD.   SABRAThese nails are painful walking and wearing shoes.  This patient presents for at risk foot care today.  General Appearance  Alert, conversant and in no acute stress.  Vascular  Dorsalis pedis and posterior tibial  pulses are palpable  bilaterally.  Capillary return is within normal limits  bilaterally. Temperature is within normal limits  bilaterally.  Neurologic  Senn-Weinstein monofilament wire test diminished bilaterally. Muscle power within normal limits bilaterally.  Nails Thick disfigured discolored nails with subungual debris  from hallux to fifth toes bilaterally. No evidence of bacterial infection or drainage bilaterally.  Orthopedic  No limitations of motion  feet .  No crepitus or effusions noted.  No bony pathology or digital deformities noted. DJD 1st  MPJ  B/L.  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    Skin  normotropic skin with no porokeratosis noted bilaterally.  No signs of infections or ulcers noted.     Asymptomatic porokeratosis sub 4th left  Onychomycosis  Pain in right toes  Pain in left toes   Consent was obtained for treatment procedures.   Mechanical debridement of nails 1-5  bilaterally performed with a nail nipper.  Filed with dremel without incident.     Return office visit   10 weeks                  Told patient to return for periodic foot care and evaluation due to potential at risk complications.   Cordella Bold DPM

## 2024-07-23 ENCOUNTER — Encounter (HOSPITAL_COMMUNITY)

## 2024-08-03 ENCOUNTER — Ambulatory Visit (HOSPITAL_COMMUNITY): Payer: Self-pay

## 2024-09-12 ENCOUNTER — Ambulatory Visit: Admitting: Podiatry

## 2024-12-03 ENCOUNTER — Inpatient Hospital Stay

## 2024-12-10 ENCOUNTER — Inpatient Hospital Stay: Admitting: Physician Assistant
